# Patient Record
Sex: Female | Born: 1935 | Race: White | Hispanic: No | Marital: Single | Smoking: Never smoker
Health system: Southern US, Community
[De-identification: ages and names within clinical notes are randomized; demographics above are authoritative.]

## PROBLEM LIST (undated history)

## (undated) DIAGNOSIS — I4891 Unspecified atrial fibrillation: Secondary | ICD-10-CM

## (undated) DIAGNOSIS — I10 Essential (primary) hypertension: Secondary | ICD-10-CM

## (undated) DIAGNOSIS — F0280 Dementia in other diseases classified elsewhere without behavioral disturbance: Secondary | ICD-10-CM

---

## 1998-03-03 ENCOUNTER — Other Ambulatory Visit: Admission: RE | Admit: 1998-03-03 | Discharge: 1998-03-03 | Payer: Self-pay | Admitting: Obstetrics and Gynecology

## 1998-08-21 ENCOUNTER — Other Ambulatory Visit: Admission: RE | Admit: 1998-08-21 | Discharge: 1998-08-21 | Payer: Self-pay | Admitting: Obstetrics and Gynecology

## 1999-03-01 ENCOUNTER — Other Ambulatory Visit: Admission: RE | Admit: 1999-03-01 | Discharge: 1999-03-01 | Payer: Self-pay | Admitting: Obstetrics and Gynecology

## 1999-08-02 ENCOUNTER — Other Ambulatory Visit: Admission: RE | Admit: 1999-08-02 | Discharge: 1999-08-02 | Payer: Self-pay | Admitting: Obstetrics and Gynecology

## 2000-02-24 ENCOUNTER — Other Ambulatory Visit: Admission: RE | Admit: 2000-02-24 | Discharge: 2000-02-24 | Payer: Self-pay | Admitting: Obstetrics and Gynecology

## 2000-05-09 ENCOUNTER — Encounter: Payer: Self-pay | Admitting: Internal Medicine

## 2000-05-09 ENCOUNTER — Encounter: Admission: RE | Admit: 2000-05-09 | Discharge: 2000-05-09 | Payer: Self-pay | Admitting: Internal Medicine

## 2000-09-01 ENCOUNTER — Inpatient Hospital Stay (HOSPITAL_COMMUNITY): Admission: AD | Admit: 2000-09-01 | Discharge: 2000-09-12 | Payer: Self-pay | Admitting: Psychiatry

## 2000-09-10 ENCOUNTER — Encounter: Payer: Self-pay | Admitting: *Deleted

## 2001-01-08 ENCOUNTER — Other Ambulatory Visit: Admission: RE | Admit: 2001-01-08 | Discharge: 2001-01-08 | Payer: Self-pay | Admitting: Obstetrics and Gynecology

## 2001-08-17 ENCOUNTER — Other Ambulatory Visit: Admission: RE | Admit: 2001-08-17 | Discharge: 2001-08-17 | Payer: Self-pay | Admitting: Obstetrics and Gynecology

## 2002-08-19 ENCOUNTER — Encounter: Payer: Self-pay | Admitting: Emergency Medicine

## 2002-08-19 ENCOUNTER — Emergency Department (HOSPITAL_COMMUNITY): Admission: EM | Admit: 2002-08-19 | Discharge: 2002-08-19 | Payer: Self-pay | Admitting: Emergency Medicine

## 2002-11-06 ENCOUNTER — Other Ambulatory Visit: Admission: RE | Admit: 2002-11-06 | Discharge: 2002-11-06 | Payer: Self-pay | Admitting: Obstetrics and Gynecology

## 2003-10-15 ENCOUNTER — Encounter (INDEPENDENT_AMBULATORY_CARE_PROVIDER_SITE_OTHER): Payer: Self-pay | Admitting: Specialist

## 2003-10-15 ENCOUNTER — Ambulatory Visit (HOSPITAL_COMMUNITY): Admission: RE | Admit: 2003-10-15 | Discharge: 2003-10-15 | Payer: Self-pay | Admitting: *Deleted

## 2005-05-27 ENCOUNTER — Emergency Department (HOSPITAL_COMMUNITY): Admission: EM | Admit: 2005-05-27 | Discharge: 2005-05-28 | Payer: Self-pay | Admitting: Emergency Medicine

## 2005-09-01 ENCOUNTER — Other Ambulatory Visit: Admission: RE | Admit: 2005-09-01 | Discharge: 2005-09-01 | Payer: Self-pay | Admitting: Obstetrics and Gynecology

## 2007-03-05 ENCOUNTER — Ambulatory Visit (HOSPITAL_COMMUNITY): Admission: RE | Admit: 2007-03-05 | Discharge: 2007-03-05 | Payer: Self-pay | Admitting: *Deleted

## 2010-10-26 NOTE — Op Note (Signed)
NAMEJAREN, Jasmine Woodward               ACCOUNT NO.:  [id number]   MEDICAL RECORD NO.:  [id number]          PATIENT TYPE:  AMB   LOCATION:  ENDO                         FACILITY:  [facility]   PHYSICIAN:  Georgiana Spinner, M.D.    DATE OF BIRTH:  1928/01/15   DATE OF PROCEDURE:  03/05/2007  DATE OF DISCHARGE:                               OPERATIVE REPORT   PROCEDURE:  Colonoscopy.   INDICATIONS:  Colon polyps.   ANESTHESIA:  Fentanyl 25 mcg, Versed 3 mg   DESCRIPTION OF PROCEDURE:  With the patient mildly sedated in the left  lateral decubitus position, the Pentax videoscopic colonoscope was  inserted into the rectum, passed under direct vision to the cecum  identified by ileocecal valve and appendiceal orifice, both of which  were photographed.  From this point, the colonoscope was slowly  withdrawn taking circumferential views of colonic mucosa, stopping in  the rectum which appeared normal on direct and showed hemorrhoids on  retroflexed view.  The endoscope was straightened and withdrawn.  The  patient's vital signs and pulse oximeter remained stable.  The patient  tolerated the procedure well without apparent complications.   FINDINGS:  Internal hemorrhoids, otherwise an unremarkable colonoscopic  examination to the cecum.   PLAN:  Repeat examination in five years           ______________________________  Georgiana Spinner, M.D.     GMO/MEDQ  D:  03/05/2007  T:  03/05/2007  Job:  [id number]

## 2010-10-29 NOTE — H&P (Signed)
[facility]  Patient:    Jasmine Woodward, Jasmine Woodward                        MRN: [id number] Adm. Date:  [id number] Attending:  Jasmine Pang CC:         [facility].   Psychiatric Admission Assessment  DATE OF ADMISSION:  September 01, 2000.  IDENTIFICATION:  A 77 year old Caucasian female from [place], [place], admitted due to depression, delusional ideation and suicidal ideation.  Patient reports that she has become increasingly depressed over the past several weeks.  She has had multiple neurovegetative symptoms, including insomnia, with difficulty falling asleep, difficulty concentration, anhedonia, anergia, feelings of hopelessness, worthlessness, and suicidal ideation.  She became so hopeless prior to admission she attempted to smother herself twice this week with a pillow.  Her family reports that she has a delusional belief that her house is "sinking" and she is extremely anxious about this.  They report she is very anxious in general.  PAST PSYCHIATRIC HISTORY:  Patient has had 2 inpatient hospitalizations in the past, one at [facility] in February 1991 and one at [facility] sometime approximately 1960.  She is not currently receiving any psychiatric treatment or on any psychiatric medications.  PAST MEDICAL HISTORY:  Patient is treated by Dr. Jearl Klinefelter and Dr. Rosaura Carpenter in [place].  She suffers from hypertension, back pain.  She has also had a toenail fungus and a history of seizures many, many years ago, but this has not returned.  Medications include Premarin .3 mg q.d., Lozol 2.5 mg q.a.m. and a fungal infection cream for her toenails.  ALLERGIES:  No known drug allergies.  FAMILY PSYCHIATRIC HISTORY:  None reported.  SUBSTANCE ABUSE:  Patient and family deny.  SOCIAL HISTORY:  Patient [place] her [place] in [place] She has 2 children.  Her husband and son were with  her during the admission process.  They are supportive.  Her husband works second shift at [employer].  She does not work out of the home.  She has no legal problems.  MENTAL STATUS EXAMINATION:  Casually dressed, anxious appearing elderly female with intermittent eye contact.  There was significant psychomotor retardation. Speech was soft and slow.  Mood was depressed and anxious.  Affect consistent with mood.  There was positive suicidal ideation as per history of present illness.  She was able to contract not to harm herself while in the hospital. There was no homicidal ideation.  Psychosis:  There was positive delusions as per history of present illness.  There were no hallucinations.  Thought processes slowed and at times tangential.  Thought content:  Ruminating about whether she will be able to wash her clothes while in the hospital.  Cognitive exam:  Patient was oriented to person and place and reason for being here. She was unsure of the date.  Short term and long term memory were impaired. General fund of knowledge were deficient for her age and education level. Attention and concentration were poor.  Judgment poor, insight minimal.  ADMISSION DIAGNOSES: Axis I:    Major depression, recurrent, severe, with psychosis. Axis II:   Deferred. Axis III:  Healthy. Axis IV:   Severe. Axis V:    Global assessment of function current 10, highest past year 60.  ASSETS AND STRENGTHS:  Supportive family.  PROBLEMS:  Depressed mood with delusional ideation and suicidal ideation.  SHORT TERM TREATMENT GOAL:  Resolution of suicidal ideation, improvement in reality testing.  LONG TERM TREATMENT GOAL:  Resolution of mood instability and delusional ideation.  INITIAL PLAN OF CARE:  We will begin Risperdal 0.5 mg now, then b.i.d. and will begin Celexa 10 mg q.a.m. x 1 day, then 20 mg q.a.m.  Patient will also be involved in unit therapeutic groups and activities in order to help her with  activities of daily living and with reality orientation and decreased suicidal ideation.  Patient will have family therapy to determine disposition issues.  ESTIMATED LENGTH OF INPATIENT TREATMENT:  Five to seven days.  CONDITION NECESSARY FOR DISCHARGE:  Not suicidal, less delusional.  INITIAL DISCHARGE PLAN:  Patient will return home to live with her family in [place], [place].  Follow up therapy and medication management will be at the [facility]. DD:  09/02/00 TD:  09/03/00 Job: [id number]

## 2010-10-29 NOTE — Op Note (Signed)
NAME:  Jasmine Woodward, Jasmine Woodward                         ACCOUNT NO.:  [id number]   MEDICAL RECORD NO.:  [id number]                   PATIENT TYPE:  AMB   LOCATION:  ENDO                                 FACILITY:  [facility]   PHYSICIAN:  Georgiana Spinner, M.D.                 DATE OF BIRTH:  Mar 26, 1928   DATE OF PROCEDURE:  DATE OF DISCHARGE:                                 OPERATIVE REPORT   PROCEDURE:  Colonoscopy with polypectomy.   INDICATIONS:  Colon polyp.   ANESTHESIA:  Demerol 60 mg, Versed 6 mg.   DESCRIPTION OF PROCEDURE:  With the patient mildly sedated and in the left  lateral decubitus position, the Olympus videoscopic colonoscope was inserted  in the rectum and passed under direct vision to the cecum, identified by  ileocecal valve and appendiceal orifice, both of which were photographed.  From this point,  the colonoscope was slowly withdrawn, taking  circumferential views of the colonic mucosa, stopping at 60 cm from the anal  verge at which point a polyp was encountered.  It was photographed and was  removed using snare cautery technique, setting 20/20 blended current.  Tissue was suctioned into the colonoscope and retrieved.  The endoscope was  withdrawn all the way to the rectum which appeared normal on direct.  Showed  hemorrhoids on retroflexed view.  The endoscope was straightened and  withdrawn.  The patient's vital signs and pulse oximetry remained stable.  The patient tolerated the procedure well without apparent complications.   FINDINGS:  Internal hemorrhoids, polyps at 60 cm; otherwise unremarkable  exam.   PLAN:  Await biopsy report.  The patient will call me for results and follow  up with me as an outpatient.                                               Georgiana Spinner, M.D.    GMO/MEDQ  D:  10/15/2003  T:  10/15/2003  Job:  [id number]

## 2010-10-29 NOTE — Discharge Summary (Signed)
[facility]  Patient:    Jasmine Woodward, Jasmine Woodward                        MRN: [id number] Adm. Date:  [id number] Disc. Date: [id number] Attending:  Jasmine Pang CC:         [facility].                           Discharge Summary  REASON FOR ADMISSION:  The patient was a 77 year old Caucasian female from [place], [place], admitted due to depression, delusional ideation, and suicidal ideation.  She had become increasingly depressed over the past several weeks with multiple neurovegetative symptoms.  She had become hopeless due to a delusion that the foundation on her house was sinking.  She was anxious about this and wanted to remove her furniture from the home.  The patient has had two previous inpatient hospitalizations, one at [facility] and one at [facility].  She is currently on no psychiatric medications.  For further psychiatric admission information, see admission assessment.  PHYSICAL EXAMINATION:  This was done by [facility]  The patient was noted to have a fungal infection of her toes.  She was on medication for this (Sporanox).  She complained of some blurry vision.  She has hypertension that is treated.  Admission laboratories: A complete dementia workup was done due to the patients mental status which suggested a dementia.  Vitamin B12 was within normal limits.  Folate was within normal limits.  RPR: Nonreactive. CBC with differential: Grossly within normal limits.  Routine chemistry profile: Within normal limits except for a slightly elevated glucose of 137 (70 to 115).  TSH was within normal limits, free T4 within normal limits. Urine drug screen: Negative.  Urinalysis: Positive for many bacteria.  HOSPITAL COURSE:  Upon admission, the patient was continued on her home medications of Premarin 0.3 mg q.d., Lozol 2.5 mg p.o. q.d., Sporanox 100 mg p.o. two pills in the morning and at supper.  She was  also begun on trazodone 50 mg p.o. q.h.s.  On September 02, 2000, she was begun on Celexa 10 mg q.a.m. x 1 day then 20 mg q.a.m.  She was also begun on Risperdal 0.5 mg p.o. b.i.d. due to her delusional ideation.  She was also placed on Cogentin 1 mg p.o. q.4h. p.r.n. EPS.  MRI of the head was done; the results were overall unremarkable. and consistent with age.  An appointment will be made with [facility] on an outpatient basis to determine whether any further workup needs to be done for dementia.  A follow-up appointment will also be made with her primary care physician due to the slightly elevated glucose and evidence of urinary tract infection.  The patient had difficulty participating on the unit due to her decreased cognitive functioning.  There was an improvement in her delusional ideation as the hospitalization progressed.  She was able to get to the point that she could recognize that the houses foundation may not be moving though she still was very worried that it was.  She stated she wanted to go home, however, and felt comfortable living there and did not want to move her furniture out.  Her husband and son were very supportive.  They attended family sessions.  Her son arranged for numerous people from the community to be involved and help in the home setting  when Ms. Carbin returns home so she will have constant supervision.  They were not interested in placement at this point outside of the home and felt she would be happier there.  This was also Ms. Coxs wish.  DISCHARGE MENTAL STATUS EXAMINATION:  Improved.  Mood was less depressed and anxious.  Affect: Brighter, able to reveal wide range at times.  No suicidal or homicidal ideation, no self-injurious behavior or aggression.  Psychosis existed in that she still was somewhat delusional about the house foundation moving; however, she was able to admit she was not sure this was the case and knew that they may not  be shifting.  She had no hallucination.  Thought processes were fairly slowed and nonspontaneous.  Cognitive: Continued to reveal poor short-term and long-term memory and general fund of knowledge that was not consistent with her age and education level status.  Attention and concentration were poor.  Judgment: Poor.  Insight: Minimal.  DISCHARGE DIAGNOSES: Axis I:    1. Major depression, recurrent, severe with psychosis.            2. Dementia, not otherwise specified. Axis II:   None. Axis III:  1. Hypertension.            2. Toenail fungus.            3. Slightly elevated serum glucose.            4. Urinary tract infection. Axis IV:   Moderate. Axis V:    Global assessment of functioning at discharge was 45, highest past            year was 60, global assessment of functioning on admission was 10.  DISCHARGE MEDICATIONS: 1. Celexa 20 mg q.a.m. 2. Risperdal 0.5 mg p.o. b.i.d. 3. Cogentin 1 mg p.o. b.i.d. 4. Trazodone 50 mg p.o. q.h.s. 5. Septra DS one p.o. b.i.d. x 7 days to treat her UTI. 6. Sporanox 100 mg twice daily, Premarin 0.3 mg daily, and Lozol 2.5 mg daily     as prescribed by her primary care physician.  DIET:  No restrictions.  ACTIVITY LEVEL:  No restrictions.  FOLLOWUP: 1. [facility] for further workup of dementia. 2. Primary care physician for UTI and for increased serum glucose.  POST HOSPITAL CARE PLAN:  The patient will receive her psychiatric care at [facility].  She will have a entry appointment with Tresa Endo on September 20, 2000 at 1:45 p.m. DD:  09/12/00 TD:  09/12/00 Job: [id number]

## 2012-02-27 DIAGNOSIS — I10 Essential (primary) hypertension: Secondary | ICD-10-CM | POA: Diagnosis not present

## 2012-02-27 DIAGNOSIS — N390 Urinary tract infection, site not specified: Secondary | ICD-10-CM | POA: Diagnosis not present

## 2012-02-27 DIAGNOSIS — E782 Mixed hyperlipidemia: Secondary | ICD-10-CM | POA: Diagnosis not present

## 2012-02-27 DIAGNOSIS — H908 Mixed conductive and sensorineural hearing loss, unspecified: Secondary | ICD-10-CM | POA: Diagnosis not present

## 2012-02-27 DIAGNOSIS — M159 Polyosteoarthritis, unspecified: Secondary | ICD-10-CM | POA: Diagnosis not present

## 2012-03-05 DIAGNOSIS — H908 Mixed conductive and sensorineural hearing loss, unspecified: Secondary | ICD-10-CM | POA: Diagnosis not present

## 2012-03-05 DIAGNOSIS — M159 Polyosteoarthritis, unspecified: Secondary | ICD-10-CM | POA: Diagnosis not present

## 2012-03-05 DIAGNOSIS — Z780 Asymptomatic menopausal state: Secondary | ICD-10-CM | POA: Diagnosis not present

## 2012-03-05 DIAGNOSIS — Z23 Encounter for immunization: Secondary | ICD-10-CM | POA: Diagnosis not present

## 2012-03-05 DIAGNOSIS — E782 Mixed hyperlipidemia: Secondary | ICD-10-CM | POA: Diagnosis not present

## 2012-03-05 DIAGNOSIS — I10 Essential (primary) hypertension: Secondary | ICD-10-CM | POA: Diagnosis not present

## 2012-03-08 ENCOUNTER — Other Ambulatory Visit: Payer: Self-pay

## 2012-03-08 ENCOUNTER — Emergency Department (HOSPITAL_COMMUNITY)
Admission: EM | Admit: 2012-03-08 | Discharge: 2012-03-08 | Disposition: A | Discharge status: Home / Self Care | Payer: Medicare Other | Attending: Emergency Medicine | Admitting: Emergency Medicine

## 2012-03-08 ENCOUNTER — Encounter (HOSPITAL_COMMUNITY): Payer: Self-pay | Admitting: Emergency Medicine

## 2012-03-08 ENCOUNTER — Emergency Department (HOSPITAL_COMMUNITY): Payer: Medicare Other

## 2012-03-08 DIAGNOSIS — Z23 Encounter for immunization: Secondary | ICD-10-CM | POA: Insufficient documentation

## 2012-03-08 DIAGNOSIS — T63461A Toxic effect of venom of wasps, accidental (unintentional), initial encounter: Secondary | ICD-10-CM | POA: Insufficient documentation

## 2012-03-08 DIAGNOSIS — T6391XA Toxic effect of contact with unspecified venomous animal, accidental (unintentional), initial encounter: Secondary | ICD-10-CM | POA: Diagnosis not present

## 2012-03-08 DIAGNOSIS — R55 Syncope and collapse: Secondary | ICD-10-CM | POA: Diagnosis not present

## 2012-03-08 DIAGNOSIS — S0100XA Unspecified open wound of scalp, initial encounter: Secondary | ICD-10-CM | POA: Insufficient documentation

## 2012-03-08 DIAGNOSIS — I10 Essential (primary) hypertension: Secondary | ICD-10-CM | POA: Diagnosis not present

## 2012-03-08 DIAGNOSIS — T63441A Toxic effect of venom of bees, accidental (unintentional), initial encounter: Secondary | ICD-10-CM

## 2012-03-08 DIAGNOSIS — S0990XA Unspecified injury of head, initial encounter: Secondary | ICD-10-CM | POA: Diagnosis not present

## 2012-03-08 DIAGNOSIS — S0101XA Laceration without foreign body of scalp, initial encounter: Secondary | ICD-10-CM

## 2012-03-08 HISTORY — DX: Essential (primary) hypertension: I10

## 2012-03-08 LAB — COMPREHENSIVE METABOLIC PANEL
ALT: 18 U/L (ref 0–35)
AST: 27 U/L (ref 0–37)
Albumin: 4.3 g/dL (ref 3.5–5.2)
Alkaline Phosphatase: 94 U/L (ref 39–117)
BUN: 20 mg/dL (ref 6–23)
CO2: 28 mEq/L (ref 19–32)
Calcium: 10.3 mg/dL (ref 8.4–10.5)
Chloride: 100 mEq/L (ref 96–112)
Creatinine, Ser: 0.76 mg/dL (ref 0.50–1.10)
GFR calc Af Amer: 89 mL/min — ABNORMAL LOW (ref >90)
GFR calc non Af Amer: 76 mL/min — ABNORMAL LOW (ref >90)
Glucose, Bld: 129 mg/dL — ABNORMAL HIGH (ref 70–99)
Potassium: 3.9 mEq/L (ref 3.5–5.1)
Sodium: 139 mEq/L (ref 135–145)
Total Bilirubin: 0.5 mg/dL (ref 0.3–1.2)
Total Protein: 7.3 g/dL (ref 6.0–8.3)

## 2012-03-08 LAB — CBC WITH DIFFERENTIAL/PLATELET
Basophils Absolute: 0 K/uL (ref 0.0–0.1)
Basophils Relative: 0 % (ref 0–1)
Eosinophils Absolute: 0 K/uL (ref 0.0–0.7)
Eosinophils Relative: 0 % (ref 0–5)
HCT: 39.3 % (ref 36.0–46.0)
Hemoglobin: 13.5 g/dL (ref 12.0–15.0)
Lymphocytes Relative: 11 % — ABNORMAL LOW (ref 12–46)
Lymphs Abs: 1.3 K/uL (ref 0.7–4.0)
MCH: 30.4 pg (ref 26.0–34.0)
MCHC: 34.4 g/dL (ref 30.0–36.0)
MCV: 88.5 fL (ref 78.0–100.0)
Monocytes Absolute: 0.5 K/uL (ref 0.1–1.0)
Monocytes Relative: 4 % (ref 3–12)
Neutro Abs: 9.5 K/uL — ABNORMAL HIGH (ref 1.7–7.7)
Neutrophils Relative %: 84 % — ABNORMAL HIGH (ref 43–77)
Platelets: 209 K/uL (ref 150–400)
RBC: 4.44 MIL/uL (ref 3.87–5.11)
RDW: 13.9 % (ref 11.5–15.5)
WBC: 11.2 K/uL — ABNORMAL HIGH (ref 4.0–10.5)

## 2012-03-08 MED ORDER — DIPHENHYDRAMINE HCL 25 MG PO CAPS
25.0000 mg | ORAL_CAPSULE | Freq: Once | ORAL | Status: AC
  Administered 2012-03-08: 25 mg via ORAL
  Filled 2012-03-08: qty 1

## 2012-03-08 MED ORDER — TETANUS-DIPHTH-ACELL PERTUSSIS 5-2.5-18.5 LF-MCG/0.5 IM SUSP
0.5000 mL | Freq: Once | INTRAMUSCULAR | Status: AC
  Administered 2012-03-08: 0.5 mL via INTRAMUSCULAR
  Filled 2012-03-08: qty 0.5

## 2012-03-08 MED ORDER — DIPHENHYDRAMINE HCL 25 MG PO TABS: 12.5000 mg | ORAL_TABLET | Freq: Three times a day (TID) | ORAL | Status: DC | PRN

## 2012-03-08 MED ORDER — LIDOCAINE-EPINEPHRINE 2 %-1:100000 IJ SOLN
20.0000 mL | Freq: Once | INTRAMUSCULAR | Status: DC
Start: 2012-03-08 — End: 2012-03-08
  Filled 2012-03-08: qty 20

## 2012-03-08 NOTE — ED Notes (Addendum)
Pt reports she was outside trimming hedges when a bee stung her on her right hand.  She stated, "I passed out and rolled down a hill and landed on some asphalt.  Pt's son said she was unconscious for about 15 minutes.  Pt is alert and oriented and does not appear to be in any respiratory distress.

## 2012-03-08 NOTE — ED Notes (Signed)
EDP in to staple scalp lac

## 2012-03-08 NOTE — ED Notes (Signed)
Pt currently in xray dept. 

## 2012-03-08 NOTE — ED Notes (Signed)
Onset today bee sting right 5th finger continued to work and then syncope approx 15-30 minutes.  Patient has head laceration and left knee abrasions. Pain 2/10 soreness head and left knee. And itching right fifth digit where bee sting. Ax4. Answering and following commands appropriate.

## 2012-03-08 NOTE — ED Provider Notes (Addendum)
History     CSN: [id number]  Arrival date & time 03/08/12  1212   First MD Initiated Contact with Patient 03/08/12 1359      Chief Complaint  Patient presents with  . Loss of Consciousness  . Insect Bite    (Consider location/radiation/quality/duration/timing/severity/associated sxs/prior treatment) HPI Comments: Ms. Ammon presents from home with her son for evaluation.  She states she was trimming a bush earlier today when a bee stung her on the right hand.  She reports severe pain and then, I blacked out".  She awoke on the ground with a headache and scalp bleeding.  She was able to get back upright and she called her son on the telephone for a ride to the hospital.  She currently c/o redness, swelling, and burning of the 3rd-5th fingers on her right hand.  She also reports a bump on her head.  She denies any neck pain, visual changes, one-sided weakness, CP, palpitations, SOB, coughing, abd pain, hip pain, extremity pain.  She adds there is a small contusion on her left knee and a scrape on her left leg.  Patient is a 78 y.o. female presenting with syncope. The history is provided by the patient. No language interpreter was used.  Loss of Consciousness This is a new problem. The current episode started 3 to 5 hours ago. Episode frequency: 1 episode. The problem has been resolved. Pertinent negatives include no chest pain, no abdominal pain, no headaches and no shortness of breath. Nothing aggravates the symptoms.    Past Medical History  Diagnosis Date  . Hypertension     History reviewed. No pertinent past surgical history.  No family history on file.  History  Substance Use Topics  . Smoking status: Never Smoker   . Smokeless tobacco: Not on file  . Alcohol Use:     OB History    Grav Para Term Preterm Abortions TAB SAB Ect Mult Living                  Review of Systems  Constitutional: Negative for fever, chills, diaphoresis, activity change, appetite change and  fatigue.  HENT: Negative for ear pain, nosebleeds, congestion, sore throat, facial swelling, rhinorrhea, trouble swallowing, neck pain, neck stiffness, dental problem and voice change.   Eyes: Negative for pain and visual disturbance.  Respiratory: Negative for cough, choking, chest tightness, shortness of breath and wheezing.   Cardiovascular: Positive for syncope. Negative for chest pain, palpitations and leg swelling.  Gastrointestinal: Negative for nausea, vomiting, abdominal pain, diarrhea and constipation.  Genitourinary: Negative for dysuria, urgency, hematuria, decreased urine volume and difficulty urinating.  Musculoskeletal: Negative for myalgias, back pain, joint swelling, arthralgias and gait problem.  Skin: Positive for color change and wound. Negative for pallor and rash.  Neurological: Positive for syncope. Negative for dizziness, tremors, weakness, light-headedness, numbness and headaches.  Hematological: Negative for adenopathy. Does not bruise/bleed easily.  Psychiatric/Behavioral: Negative for confusion.    Allergies  Review of patient's allergies indicates no known allergies.  Home Medications   Current Outpatient Rx  Name Route Sig Dispense Refill  . INDAPAMIDE 2.5 MG PO TABS Oral Take 2.5 mg by mouth every morning.    . ADULT MULTIVITAMIN W/MINERALS CH Oral Take 1 tablet by mouth daily.      BP 157/64  Pulse 68  Temp 98 F (36.7 C) (Oral)  Resp 18  SpO2 100%  Physical Exam  Vitals reviewed. Constitutional: She is oriented to person, place, and time. She  appears well-developed and well-nourished. She is active.  Non-toxic appearance. She does not have a sickly appearance. She does not appear ill. No distress. She is not intubated.  HENT:  Head: Normocephalic. Not macrocephalic and not microcephalic. Head is with contusion and with laceration. Head is without raccoon's eyes, without Battle's sign, without right periorbital erythema and without left periorbital  erythema. No trismus in the jaw.    Right Ear: Tympanic membrane, external ear and ear canal normal. No mastoid tenderness. No hemotympanum.  Left Ear: Tympanic membrane, external ear and ear canal normal. No mastoid tenderness. No hemotympanum.  Nose: Right sinus exhibits no maxillary sinus tenderness and no frontal sinus tenderness. Left sinus exhibits no maxillary sinus tenderness and no frontal sinus tenderness.  Mouth/Throat: Uvula is midline and oropharynx is clear and moist. Mucous membranes are not pale, not dry and not cyanotic. No uvula swelling.  Eyes: Conjunctivae normal, EOM and lids are normal. Pupils are equal, round, and reactive to light. Right eye exhibits no discharge and no exudate. Left eye exhibits no discharge and no exudate. Right conjunctiva is not injected. Right conjunctiva has no hemorrhage. Left conjunctiva is not injected. Left conjunctiva has no hemorrhage. No scleral icterus. Right eye exhibits normal extraocular motion and no nystagmus. Left eye exhibits normal extraocular motion and no nystagmus. Right pupil is round and reactive. Left pupil is round and reactive. Pupils are equal.  Neck: Trachea normal, normal range of motion, full passive range of motion without pain and phonation normal. Neck supple. Normal carotid pulses and no JVD present. No tracheal tenderness, no spinous process tenderness and no muscular tenderness present. Carotid bruit is not present. No rigidity. No tracheal deviation, no edema, no erythema and normal range of motion present. No mass and no thyromegaly present.  Cardiovascular: Regular rhythm, normal heart sounds, intact distal pulses and normal pulses.   No extrasystoles are present. PMI is not displaced.  Exam reveals no gallop and no decreased pulses.   No murmur heard. Pulmonary/Chest: Effort normal. No accessory muscle usage or stridor. No apnea, not tachypneic and not bradypneic. She is not intubated. No respiratory distress. She has no  decreased breath sounds. She has no wheezes. She has no rhonchi. She has no rales.  Abdominal: Normal appearance and bowel sounds are normal. She exhibits no shifting dullness, no pulsatile midline mass and no mass. There is no hepatosplenomegaly. There is no tenderness. There is no rigidity, no rebound, no guarding, no CVA tenderness, no tenderness at McBurney's point and negative Murphy's sign. No hernia. Hernia confirmed negative in the ventral area.  Musculoskeletal: Normal range of motion. She exhibits no edema and no tenderness.       Left knee: She exhibits no swelling, no effusion, no ecchymosis, no deformity, no laceration, no erythema and no bony tenderness. no tenderness found.       Legs: Neurological: She is alert and oriented to person, place, and time. She displays no atrophy and no tremor. No cranial nerve deficit. She exhibits normal muscle tone. She displays no seizure activity. Coordination normal. She displays no Babinski's sign on the right side. She displays no Babinski's sign on the left side.  Skin: Skin is warm and dry. Laceration noted. No rash noted. She is not diaphoretic.     Psychiatric: She has a normal mood and affect. Her behavior is normal. Her mood appears not anxious. Her affect is not angry, not blunt, not labile and not inappropriate. Her speech is not rapid and/or pressured,  not delayed, not tangential and not slurred. She is not agitated, not aggressive, is not hyperactive, not slowed, not withdrawn, not actively hallucinating and not combative. Cognition and memory are normal. Cognition and memory are not impaired. She does not exhibit a depressed mood. She is communicative. She exhibits normal recent memory and normal remote memory. She is attentive.    ED Course  LACERATION REPAIR Performed by: Dana Allan T Authorized by: Dana Allan T Consent: Verbal consent obtained. Written consent not obtained. Risks and benefits: risks, benefits and alternatives  were discussed Consent given by: patient Patient understanding: patient states understanding of the procedure being performed Patient identity confirmed: verbally with patient Body area: head/neck Location details: scalp Laceration length: 2 cm Foreign bodies: no foreign bodies Tendon involvement: none Nerve involvement: none Vascular damage: no Anesthesia: local infiltration Local anesthetic: lidocaine 2% with epinephrine Anesthetic total: 2 ml Patient sedated: no Preparation: Patient was prepped and draped in the usual sterile fashion. Irrigation solution: saline Irrigation method: syringe Amount of cleaning: standard Debridement: none Skin closure: staples Number of sutures: 3 Technique: simple Approximation: close Approximation difficulty: simple Dressing: antibiotic ointment Patient tolerance: Patient tolerated the procedure well with no immediate complications.   (including critical care time)  Labs Reviewed  CBC WITH DIFFERENTIAL - Abnormal; Notable for the following:    WBC 11.2 (*)     Neutrophils Relative 84 (*)     Neutro Abs 9.5 (*)     Lymphocytes Relative 11 (*)     All other components within normal limits  COMPREHENSIVE METABOLIC PANEL - Abnormal; Notable for the following:    Glucose, Bld 129 (*)     GFR calc non Af Amer 76 (*)     GFR calc Af Amer 89 (*)     All other components within normal limits   No results found.   No diagnosis found.   Date: 03/08/2012  Rate: 62 bpm  Rhythm: sinus  QRS Axis: normal  Intervals: normal  ST/T Wave abnormalities: nonspecific ST changes  Conduction Disutrbances:none  Narrative Interpretation: no STEMI  Old EKG Reviewed: none available      MDM  Pt presents for evaluation after a syncopal event.  She reports being stung by a bee (or some other flying insect) just before collapsing at around 1015 this morning.  She denies any respiratory complaints, abd or pelvic pain, extremity pain, or neck pain.  She  has a scalp laceration but no step-offs, depressions, or midline tenderness.  Plan basic labs, ECG, orthostatic vital signs,    1615.  Swelling and redness of right hand has improved.  Pt tolerated laceration repair, NAD.  Note no significant abnormalities on review of CBC, BMP, CXR, and CT of head.  Pt is A&o without focal neurologic deficit and she denies any other complaints.  Today's event appears to be a vasovagal episode secondary to pain after the bee sting.  Plan discharge home.  Discussed indications for return to the ER.  Pt should follow-up in 5-7 days for suture removal.    Tobin Chad, MD 03/08/12 1622  Tobin Chad, MD 03/31/12 [id number]

## 2012-03-08 NOTE — ED Notes (Signed)
Patient transported from X-ray 

## 2012-03-14 DIAGNOSIS — R55 Syncope and collapse: Secondary | ICD-10-CM | POA: Diagnosis not present

## 2012-03-14 DIAGNOSIS — S0100XA Unspecified open wound of scalp, initial encounter: Secondary | ICD-10-CM | POA: Diagnosis not present

## 2012-03-14 DIAGNOSIS — I10 Essential (primary) hypertension: Secondary | ICD-10-CM | POA: Diagnosis not present

## 2012-03-21 DIAGNOSIS — R55 Syncope and collapse: Secondary | ICD-10-CM | POA: Diagnosis not present

## 2012-03-21 DIAGNOSIS — Z91038 Other insect allergy status: Secondary | ICD-10-CM | POA: Diagnosis not present

## 2012-06-01 DIAGNOSIS — Z1231 Encounter for screening mammogram for malignant neoplasm of breast: Secondary | ICD-10-CM | POA: Diagnosis not present

## 2012-06-11 DIAGNOSIS — R928 Other abnormal and inconclusive findings on diagnostic imaging of breast: Secondary | ICD-10-CM | POA: Diagnosis not present

## 2012-06-15 DIAGNOSIS — H40019 Open angle with borderline findings, low risk, unspecified eye: Secondary | ICD-10-CM | POA: Diagnosis not present

## 2012-07-13 DIAGNOSIS — H26499 Other secondary cataract, unspecified eye: Secondary | ICD-10-CM | POA: Diagnosis not present

## 2012-07-13 DIAGNOSIS — H40019 Open angle with borderline findings, low risk, unspecified eye: Secondary | ICD-10-CM | POA: Diagnosis not present

## 2012-10-08 DIAGNOSIS — I10 Essential (primary) hypertension: Secondary | ICD-10-CM | POA: Diagnosis not present

## 2012-10-08 DIAGNOSIS — H8110 Benign paroxysmal vertigo, unspecified ear: Secondary | ICD-10-CM | POA: Diagnosis not present

## 2012-10-08 DIAGNOSIS — E782 Mixed hyperlipidemia: Secondary | ICD-10-CM | POA: Diagnosis not present

## 2012-10-08 DIAGNOSIS — G4700 Insomnia, unspecified: Secondary | ICD-10-CM | POA: Diagnosis not present

## 2013-01-15 DIAGNOSIS — H40019 Open angle with borderline findings, low risk, unspecified eye: Secondary | ICD-10-CM | POA: Diagnosis not present

## 2013-01-15 DIAGNOSIS — H26499 Other secondary cataract, unspecified eye: Secondary | ICD-10-CM | POA: Diagnosis not present

## 2013-03-04 DIAGNOSIS — E782 Mixed hyperlipidemia: Secondary | ICD-10-CM | POA: Diagnosis not present

## 2013-03-04 DIAGNOSIS — I10 Essential (primary) hypertension: Secondary | ICD-10-CM | POA: Diagnosis not present

## 2013-03-04 DIAGNOSIS — Z0000 Encounter for general adult medical examination without abnormal findings: Secondary | ICD-10-CM | POA: Diagnosis not present

## 2013-03-04 DIAGNOSIS — Z1331 Encounter for screening for depression: Secondary | ICD-10-CM | POA: Diagnosis not present

## 2013-03-11 DIAGNOSIS — I10 Essential (primary) hypertension: Secondary | ICD-10-CM | POA: Diagnosis not present

## 2013-03-11 DIAGNOSIS — I4891 Unspecified atrial fibrillation: Secondary | ICD-10-CM | POA: Diagnosis not present

## 2013-03-11 DIAGNOSIS — E782 Mixed hyperlipidemia: Secondary | ICD-10-CM | POA: Diagnosis not present

## 2013-03-11 DIAGNOSIS — M159 Polyosteoarthritis, unspecified: Secondary | ICD-10-CM | POA: Diagnosis not present

## 2013-03-15 DIAGNOSIS — I4891 Unspecified atrial fibrillation: Secondary | ICD-10-CM | POA: Diagnosis not present

## 2013-03-15 DIAGNOSIS — I10 Essential (primary) hypertension: Secondary | ICD-10-CM | POA: Diagnosis not present

## 2013-03-15 DIAGNOSIS — M159 Polyosteoarthritis, unspecified: Secondary | ICD-10-CM | POA: Diagnosis not present

## 2013-03-15 DIAGNOSIS — E782 Mixed hyperlipidemia: Secondary | ICD-10-CM | POA: Diagnosis not present

## 2013-03-15 DIAGNOSIS — Z23 Encounter for immunization: Secondary | ICD-10-CM | POA: Diagnosis not present

## 2013-03-27 DIAGNOSIS — I4891 Unspecified atrial fibrillation: Secondary | ICD-10-CM | POA: Diagnosis not present

## 2013-04-26 DIAGNOSIS — I4891 Unspecified atrial fibrillation: Secondary | ICD-10-CM | POA: Diagnosis not present

## 2013-05-03 DIAGNOSIS — Z79899 Other long term (current) drug therapy: Secondary | ICD-10-CM | POA: Diagnosis not present

## 2013-05-03 DIAGNOSIS — I4891 Unspecified atrial fibrillation: Secondary | ICD-10-CM | POA: Diagnosis not present

## 2013-05-03 DIAGNOSIS — I10 Essential (primary) hypertension: Secondary | ICD-10-CM | POA: Diagnosis not present

## 2013-05-03 DIAGNOSIS — E782 Mixed hyperlipidemia: Secondary | ICD-10-CM | POA: Diagnosis not present

## 2013-05-03 DIAGNOSIS — M159 Polyosteoarthritis, unspecified: Secondary | ICD-10-CM | POA: Diagnosis not present

## 2013-07-22 DIAGNOSIS — I4891 Unspecified atrial fibrillation: Secondary | ICD-10-CM | POA: Diagnosis not present

## 2013-07-22 DIAGNOSIS — E782 Mixed hyperlipidemia: Secondary | ICD-10-CM | POA: Diagnosis not present

## 2013-07-26 DIAGNOSIS — I4891 Unspecified atrial fibrillation: Secondary | ICD-10-CM | POA: Diagnosis not present

## 2013-07-26 DIAGNOSIS — R5381 Other malaise: Secondary | ICD-10-CM | POA: Diagnosis not present

## 2013-07-26 DIAGNOSIS — I10 Essential (primary) hypertension: Secondary | ICD-10-CM | POA: Diagnosis not present

## 2013-07-26 DIAGNOSIS — E782 Mixed hyperlipidemia: Secondary | ICD-10-CM | POA: Diagnosis not present

## 2013-07-29 DIAGNOSIS — R5383 Other fatigue: Secondary | ICD-10-CM | POA: Diagnosis not present

## 2013-07-29 DIAGNOSIS — E782 Mixed hyperlipidemia: Secondary | ICD-10-CM | POA: Diagnosis not present

## 2013-07-29 DIAGNOSIS — R413 Other amnesia: Secondary | ICD-10-CM | POA: Diagnosis not present

## 2013-07-29 DIAGNOSIS — M25519 Pain in unspecified shoulder: Secondary | ICD-10-CM | POA: Diagnosis not present

## 2013-07-29 DIAGNOSIS — I10 Essential (primary) hypertension: Secondary | ICD-10-CM | POA: Diagnosis not present

## 2013-07-29 DIAGNOSIS — R5381 Other malaise: Secondary | ICD-10-CM | POA: Diagnosis not present

## 2013-09-04 DIAGNOSIS — H4011X0 Primary open-angle glaucoma, stage unspecified: Secondary | ICD-10-CM | POA: Diagnosis not present

## 2013-09-04 DIAGNOSIS — H40019 Open angle with borderline findings, low risk, unspecified eye: Secondary | ICD-10-CM | POA: Diagnosis not present

## 2013-09-04 DIAGNOSIS — H43399 Other vitreous opacities, unspecified eye: Secondary | ICD-10-CM | POA: Diagnosis not present

## 2013-09-04 DIAGNOSIS — H26499 Other secondary cataract, unspecified eye: Secondary | ICD-10-CM | POA: Diagnosis not present

## 2013-10-01 DIAGNOSIS — Z1231 Encounter for screening mammogram for malignant neoplasm of breast: Secondary | ICD-10-CM | POA: Diagnosis not present

## 2013-11-11 DIAGNOSIS — R82998 Other abnormal findings in urine: Secondary | ICD-10-CM | POA: Diagnosis not present

## 2013-11-11 DIAGNOSIS — Z01419 Encounter for gynecological examination (general) (routine) without abnormal findings: Secondary | ICD-10-CM | POA: Diagnosis not present

## 2013-11-11 DIAGNOSIS — Z0000 Encounter for general adult medical examination without abnormal findings: Secondary | ICD-10-CM | POA: Diagnosis not present

## 2013-11-11 DIAGNOSIS — Z8542 Personal history of malignant neoplasm of other parts of uterus: Secondary | ICD-10-CM | POA: Diagnosis not present

## 2013-11-12 DIAGNOSIS — R82998 Other abnormal findings in urine: Secondary | ICD-10-CM | POA: Diagnosis not present

## 2014-01-30 DIAGNOSIS — M25549 Pain in joints of unspecified hand: Secondary | ICD-10-CM | POA: Diagnosis not present

## 2014-01-30 DIAGNOSIS — M19049 Primary osteoarthritis, unspecified hand: Secondary | ICD-10-CM | POA: Diagnosis not present

## 2014-02-25 DIAGNOSIS — H26499 Other secondary cataract, unspecified eye: Secondary | ICD-10-CM | POA: Diagnosis not present

## 2014-02-25 DIAGNOSIS — H4011X0 Primary open-angle glaucoma, stage unspecified: Secondary | ICD-10-CM | POA: Diagnosis not present

## 2014-02-25 DIAGNOSIS — H43399 Other vitreous opacities, unspecified eye: Secondary | ICD-10-CM | POA: Diagnosis not present

## 2014-03-17 DIAGNOSIS — Z0000 Encounter for general adult medical examination without abnormal findings: Secondary | ICD-10-CM | POA: Diagnosis not present

## 2014-03-17 DIAGNOSIS — E782 Mixed hyperlipidemia: Secondary | ICD-10-CM | POA: Diagnosis not present

## 2014-03-17 DIAGNOSIS — Z1389 Encounter for screening for other disorder: Secondary | ICD-10-CM | POA: Diagnosis not present

## 2014-03-17 DIAGNOSIS — I10 Essential (primary) hypertension: Secondary | ICD-10-CM | POA: Diagnosis not present

## 2014-03-24 DIAGNOSIS — I482 Chronic atrial fibrillation: Secondary | ICD-10-CM | POA: Diagnosis not present

## 2014-03-24 DIAGNOSIS — I10 Essential (primary) hypertension: Secondary | ICD-10-CM | POA: Diagnosis not present

## 2014-03-24 DIAGNOSIS — Z780 Asymptomatic menopausal state: Secondary | ICD-10-CM | POA: Diagnosis not present

## 2014-03-24 DIAGNOSIS — Z23 Encounter for immunization: Secondary | ICD-10-CM | POA: Diagnosis not present

## 2014-03-24 DIAGNOSIS — E782 Mixed hyperlipidemia: Secondary | ICD-10-CM | POA: Diagnosis not present

## 2014-03-24 DIAGNOSIS — M159 Polyosteoarthritis, unspecified: Secondary | ICD-10-CM | POA: Diagnosis not present

## 2014-04-02 DIAGNOSIS — Z23 Encounter for immunization: Secondary | ICD-10-CM | POA: Diagnosis not present

## 2014-04-29 DIAGNOSIS — R41841 Cognitive communication deficit: Secondary | ICD-10-CM | POA: Diagnosis not present

## 2014-04-30 ENCOUNTER — Encounter (HOSPITAL_BASED_OUTPATIENT_CLINIC_OR_DEPARTMENT_OTHER): Payer: Self-pay | Admitting: Emergency Medicine

## 2014-04-30 ENCOUNTER — Emergency Department (HOSPITAL_BASED_OUTPATIENT_CLINIC_OR_DEPARTMENT_OTHER)
Admission: EM | Admit: 2014-04-30 | Discharge: 2014-04-30 | Disposition: A | Discharge status: Home / Self Care | Payer: Medicare Other | Attending: Emergency Medicine | Admitting: Emergency Medicine

## 2014-04-30 ENCOUNTER — Emergency Department (HOSPITAL_BASED_OUTPATIENT_CLINIC_OR_DEPARTMENT_OTHER): Payer: Medicare Other

## 2014-04-30 DIAGNOSIS — S0990XA Unspecified injury of head, initial encounter: Secondary | ICD-10-CM | POA: Insufficient documentation

## 2014-04-30 DIAGNOSIS — S098XXA Other specified injuries of head, initial encounter: Secondary | ICD-10-CM | POA: Diagnosis not present

## 2014-04-30 DIAGNOSIS — W19XXXA Unspecified fall, initial encounter: Secondary | ICD-10-CM

## 2014-04-30 DIAGNOSIS — T148XXA Other injury of unspecified body region, initial encounter: Secondary | ICD-10-CM

## 2014-04-30 DIAGNOSIS — S0083XA Contusion of other part of head, initial encounter: Secondary | ICD-10-CM | POA: Diagnosis not present

## 2014-04-30 DIAGNOSIS — S60011A Contusion of right thumb without damage to nail, initial encounter: Secondary | ICD-10-CM | POA: Insufficient documentation

## 2014-04-30 DIAGNOSIS — Y9389 Activity, other specified: Secondary | ICD-10-CM | POA: Insufficient documentation

## 2014-04-30 DIAGNOSIS — Y998 Other external cause status: Secondary | ICD-10-CM | POA: Diagnosis not present

## 2014-04-30 DIAGNOSIS — S65491A Other specified injury of blood vessel of right thumb, initial encounter: Secondary | ICD-10-CM | POA: Diagnosis present

## 2014-04-30 DIAGNOSIS — Y9289 Other specified places as the place of occurrence of the external cause: Secondary | ICD-10-CM | POA: Diagnosis not present

## 2014-04-30 DIAGNOSIS — W010XXA Fall on same level from slipping, tripping and stumbling without subsequent striking against object, initial encounter: Secondary | ICD-10-CM | POA: Diagnosis not present

## 2014-04-30 DIAGNOSIS — S0180XA Unspecified open wound of other part of head, initial encounter: Secondary | ICD-10-CM | POA: Diagnosis not present

## 2014-04-30 DIAGNOSIS — I10 Essential (primary) hypertension: Secondary | ICD-10-CM | POA: Diagnosis not present

## 2014-04-30 LAB — URINALYSIS, ROUTINE W REFLEX MICROSCOPIC
Bilirubin Urine: NEGATIVE
Glucose, UA: NEGATIVE mg/dL
Hgb urine dipstick: NEGATIVE
Ketones, ur: NEGATIVE mg/dL
Nitrite: NEGATIVE
Protein, ur: NEGATIVE mg/dL
Specific Gravity, Urine: 1.011 (ref 1.005–1.030)
Urobilinogen, UA: 0.2 mg/dL (ref 0.0–1.0)
pH: 7 (ref 5.0–8.0)

## 2014-04-30 LAB — URINE MICROSCOPIC-ADD ON

## 2014-04-30 NOTE — ED Notes (Signed)
Back from CT

## 2014-04-30 NOTE — Discharge Instructions (Signed)
We saw you in the ER after you had a fall. All the imaging results are normal, no fractures seen. No evidence of brain bleed. Please be very careful with walking, and do everything possible to prevent falls.   Contusion A contusion is a deep bruise. Contusions are the result of an injury that caused bleeding under the skin. The contusion may turn blue, purple, or yellow. Minor injuries will give you a painless contusion, but more severe contusions may stay painful and swollen for a few weeks.  CAUSES  A contusion is usually caused by a blow, trauma, or direct force to an area of the body. SYMPTOMS   Swelling and redness of the injured area.  Bruising of the injured area.  Tenderness and soreness of the injured area.  Pain. DIAGNOSIS  The diagnosis can be made by taking a history and physical exam. An X-ray, CT scan, or MRI may be needed to determine if there were any associated injuries, such as fractures. TREATMENT  Specific treatment will depend on what area of the body was injured. In general, the best treatment for a contusion is resting, icing, elevating, and applying cold compresses to the injured area. Over-the-counter medicines may also be recommended for pain control. Ask your caregiver what the best treatment is for your contusion. HOME CARE INSTRUCTIONS   Put ice on the injured area.  Put ice in a plastic bag.  Place a towel between your skin and the bag.  Leave the ice on for 15-20 minutes, 3-4 times a day, or as directed by your health care provider.  Only take over-the-counter or prescription medicines for pain, discomfort, or fever as directed by your caregiver. Your caregiver may recommend avoiding anti-inflammatory medicines (aspirin, ibuprofen, and naproxen) for 48 hours because these medicines may increase bruising.  Rest the injured area.  If possible, elevate the injured area to reduce swelling. SEEK IMMEDIATE MEDICAL CARE IF:   You have increased bruising  or swelling.  You have pain that is getting worse.  Your swelling or pain is not relieved with medicines. MAKE SURE YOU:   Understand these instructions.  Will watch your condition.  Will get help right away if you are not doing well or get worse. Document Released: 03/09/2005 Document Revised: 06/04/2013 Document Reviewed: 04/04/2011 [facility] Patient Information 2015 [employer], [place]. This information is not intended to replace advice given to you by your health care provider. Make sure you discuss any questions you have with your health care provider. RICE: Routine Care for Injuries The routine care of many injuries includes Rest, Ice, Compression, and Elevation (RICE). HOME CARE INSTRUCTIONS  Rest is needed to allow your body to heal. Routine activities can usually be resumed when comfortable. Injured tendons and bones can take up to 6 weeks to heal. Tendons are the cord-like structures that attach muscle to bone.  Ice following an injury helps keep the swelling down and reduces pain.  Put ice in a plastic bag.  Place a towel between your skin and the bag.  Leave the ice on for 15-20 minutes, 3-4 times a day, or as directed by your health care provider. Do this while awake, for the first 24 to 48 hours. After that, continue as directed by your caregiver.  Compression helps keep swelling down. It also gives support and helps with discomfort. If an elastic bandage has been applied, it should be removed and reapplied every 3 to 4 hours. It should not be applied tightly, but firmly enough to  keep swelling down. Watch fingers or toes for swelling, bluish discoloration, coldness, numbness, or excessive pain. If any of these problems occur, remove the bandage and reapply loosely. Contact your caregiver if these problems continue.  Elevation helps reduce swelling and decreases pain. With extremities, such as the arms, hands, legs, and feet, the injured area should be placed near or above the  level of the heart, if possible. SEEK IMMEDIATE MEDICAL CARE IF:  You have persistent pain and swelling.  You develop redness, numbness, or unexpected weakness.  Your symptoms are getting worse rather than improving after several days. These symptoms may indicate that further evaluation or further X-rays are needed. Sometimes, X-rays may not show a small broken bone (fracture) until 1 week or 10 days later. Make a follow-up appointment with your caregiver. Ask when your X-ray results will be ready. Make sure you get your X-ray results. Document Released: 09/11/2000 Document Revised: 06/04/2013 Document Reviewed: 10/29/2010 [facility] Patient Information 2015 [place], [place]. This information is not intended to replace advice given to you by your health care provider. Make sure you discuss any questions you have with your health care provider.

## 2014-04-30 NOTE — ED Notes (Signed)
[facility] mechanical fall with no LOC, no neck or back pain, small abrasions to nose and over right eye, no bleeding, no blood thinners, A/O X4, ambulatory and in NAD

## 2014-04-30 NOTE — ED Notes (Signed)
Patient transported to CT 

## 2014-05-01 NOTE — ED Provider Notes (Signed)
CSN: [id number]     Arrival date & time 04/30/14  1208 History   First MD Initiated Contact with Patient 04/30/14 1352     Chief Complaint  Patient presents with  . Fall     (Consider location/radiation/quality/duration/timing/severity/associated sxs/prior Treatment) HPI Comments: Pt on pradaxa comes in after having a mechanical fall. Pt was turning in an elevator, tripped and fell. No LOC. Pt has since ambulated. She has mild pain to the right thumb. No headaches, visual complains. No hx of recurrent falls.  Patient is a 80 y.o. female presenting with fall. The history is provided by the patient.  Fall Pertinent negatives include no chest pain, no abdominal pain and no shortness of breath.    Past Medical History  Diagnosis Date  . Hypertension    History reviewed. No pertinent past surgical history. No family history on file. History  Substance Use Topics  . Smoking status: Never Smoker   . Smokeless tobacco: Not on file  . Alcohol Use: Not on file   OB History    No data available     Review of Systems  Constitutional: Negative for activity change.  HENT: Positive for facial swelling.   Respiratory: Negative for shortness of breath.   Cardiovascular: Negative for chest pain.  Gastrointestinal: Negative for abdominal pain and abdominal distention.  Genitourinary: Negative for hematuria and difficulty urinating.  Musculoskeletal: Negative for neck pain and neck stiffness.  Skin: Positive for wound. Negative for color change.  Hematological: Bruises/bleeds easily.  Psychiatric/Behavioral: Negative for confusion.      Allergies  Review of patient's allergies indicates no known allergies.  Home Medications   Prior to Admission medications   Medication Sig Start Date End Date Taking? Authorizing Provider  diphenhydrAMINE (BENADRYL) 25 MG tablet Take 0.5 tablets (12.5 mg total) by mouth every 8 (eight) hours as needed for itching (use as needed for itching and  swelling in right hand). 03/08/12   Tobin ChadMarwan T Powers, MD  indapamide (LOZOL) 2.5 MG tablet Take 2.5 mg by mouth every morning.    Historical Provider, MD  Multiple Vitamin (MULTIVITAMIN WITH MINERALS) TABS Take 1 tablet by mouth daily.    Historical Provider, MD   BP 133/70 mmHg  Pulse 76  Temp(Src) 98.4 F (36.9 C) (Oral)  Resp 16  SpO2 99% Physical Exam  Constitutional: She is oriented to person, place, and time. She appears well-developed.  HENT:  Head: Normocephalic.  Eyes: Conjunctivae are normal.  Neck: Neck supple.  Cardiovascular: Normal rate.   Pulmonary/Chest: Effort normal.  Abdominal: Soft.  Musculoskeletal:  Pt has mild, diffuse bruising to the front of her face. There is no gross deformity. No crepitus.  Head to toe evaluation shows no hematoma, bleeding of the scalp, no facial abrasions, step offs, crepitus, no tenderness to palpation of the bilateral upper and lower extremities, no gross deformities, no chest tenderness, no pelvic pain.   Neurological: She is alert and oriented to person, place, and time.  Nursing note and vitals reviewed.   ED Course  Procedures (including critical care time) Labs Review Labs Reviewed  URINALYSIS, ROUTINE W REFLEX MICROSCOPIC - Abnormal; Notable for the following:    Leukocytes, UA TRACE (*)    All other components within normal limits  URINE MICROSCOPIC-ADD ON    Imaging Review Ct Head Wo Contrast  04/30/2014   CLINICAL DATA:  Patient fell today and hit head and face. No loss of consciousness.  EXAM: CT HEAD WITHOUT CONTRAST  TECHNIQUE: Contiguous axial  images were obtained from the base of the skull through the vertex without intravenous contrast.  COMPARISON:  03/08/2012  FINDINGS: Stable age related cerebral atrophy, ventriculomegaly and periventricular white matter disease. No extra-axial fluid collections are identified. No CT findings for acute hemispheric infarction or intracranial hemorrhage. No mass lesions. The  brainstem and cerebellum are normal.  No acute fracture. Hyperostosis frontalis interna noted. The paranasal sinuses and mastoid air cells are clear. The globes are intact. No upper facial bone fractures.  IMPRESSION: Stable age related cerebral atrophy, ventriculomegaly and periventricular white matter disease.  No acute intracranial findings or skull fracture.   Electronically Signed   By: Loralie ChampagneMark  Gallerani M.D.   On: 04/30/2014 15:12     EKG Interpretation None      MDM   Final diagnoses:  Fall  Contusion    DDx includes: - Mechanical falls - [facility] - Fractures - Contusions - Soft tissue injury  Pt comes in with cc of fall. Head to toe eval shows no significant injuries. Pt is on pradaxa, has bruising to the head/face. CT head ordered, and is neg. Already ambulated, will d.c.  Derwood KaplanAnkit Nanavati, MD 05/01/14 [phone]

## 2014-05-02 DIAGNOSIS — R41841 Cognitive communication deficit: Secondary | ICD-10-CM | POA: Diagnosis not present

## 2014-05-06 DIAGNOSIS — R41841 Cognitive communication deficit: Secondary | ICD-10-CM | POA: Diagnosis not present

## 2014-05-07 DIAGNOSIS — R41841 Cognitive communication deficit: Secondary | ICD-10-CM | POA: Diagnosis not present

## 2014-05-12 DIAGNOSIS — R41841 Cognitive communication deficit: Secondary | ICD-10-CM | POA: Diagnosis not present

## 2014-05-14 DIAGNOSIS — R41841 Cognitive communication deficit: Secondary | ICD-10-CM | POA: Diagnosis not present

## 2014-05-14 DIAGNOSIS — R2681 Unsteadiness on feet: Secondary | ICD-10-CM | POA: Diagnosis not present

## 2014-05-19 DIAGNOSIS — R41841 Cognitive communication deficit: Secondary | ICD-10-CM | POA: Diagnosis not present

## 2014-05-19 DIAGNOSIS — R2681 Unsteadiness on feet: Secondary | ICD-10-CM | POA: Diagnosis not present

## 2014-05-21 DIAGNOSIS — R2681 Unsteadiness on feet: Secondary | ICD-10-CM | POA: Diagnosis not present

## 2014-05-21 DIAGNOSIS — R41841 Cognitive communication deficit: Secondary | ICD-10-CM | POA: Diagnosis not present

## 2014-05-26 DIAGNOSIS — R41841 Cognitive communication deficit: Secondary | ICD-10-CM | POA: Diagnosis not present

## 2014-05-26 DIAGNOSIS — R2681 Unsteadiness on feet: Secondary | ICD-10-CM | POA: Diagnosis not present

## 2014-05-28 DIAGNOSIS — R2681 Unsteadiness on feet: Secondary | ICD-10-CM | POA: Diagnosis not present

## 2014-05-28 DIAGNOSIS — R41841 Cognitive communication deficit: Secondary | ICD-10-CM | POA: Diagnosis not present

## 2014-06-02 DIAGNOSIS — R2681 Unsteadiness on feet: Secondary | ICD-10-CM | POA: Diagnosis not present

## 2014-06-02 DIAGNOSIS — R41841 Cognitive communication deficit: Secondary | ICD-10-CM | POA: Diagnosis not present

## 2014-06-04 DIAGNOSIS — R41841 Cognitive communication deficit: Secondary | ICD-10-CM | POA: Diagnosis not present

## 2014-06-04 DIAGNOSIS — R2681 Unsteadiness on feet: Secondary | ICD-10-CM | POA: Diagnosis not present

## 2014-06-09 DIAGNOSIS — R2681 Unsteadiness on feet: Secondary | ICD-10-CM | POA: Diagnosis not present

## 2014-06-09 DIAGNOSIS — R41841 Cognitive communication deficit: Secondary | ICD-10-CM | POA: Diagnosis not present

## 2014-06-11 DIAGNOSIS — R2681 Unsteadiness on feet: Secondary | ICD-10-CM | POA: Diagnosis not present

## 2014-06-11 DIAGNOSIS — R41841 Cognitive communication deficit: Secondary | ICD-10-CM | POA: Diagnosis not present

## 2014-06-12 DIAGNOSIS — R41841 Cognitive communication deficit: Secondary | ICD-10-CM | POA: Diagnosis not present

## 2014-06-12 DIAGNOSIS — R2681 Unsteadiness on feet: Secondary | ICD-10-CM | POA: Diagnosis not present

## 2014-06-16 DIAGNOSIS — R41841 Cognitive communication deficit: Secondary | ICD-10-CM | POA: Diagnosis not present

## 2014-06-16 DIAGNOSIS — R2681 Unsteadiness on feet: Secondary | ICD-10-CM | POA: Diagnosis not present

## 2014-06-17 DIAGNOSIS — R41841 Cognitive communication deficit: Secondary | ICD-10-CM | POA: Diagnosis not present

## 2014-06-17 DIAGNOSIS — R2681 Unsteadiness on feet: Secondary | ICD-10-CM | POA: Diagnosis not present

## 2014-06-18 DIAGNOSIS — R2681 Unsteadiness on feet: Secondary | ICD-10-CM | POA: Diagnosis not present

## 2014-06-18 DIAGNOSIS — R41841 Cognitive communication deficit: Secondary | ICD-10-CM | POA: Diagnosis not present

## 2014-06-19 DIAGNOSIS — R2681 Unsteadiness on feet: Secondary | ICD-10-CM | POA: Diagnosis not present

## 2014-06-19 DIAGNOSIS — R41841 Cognitive communication deficit: Secondary | ICD-10-CM | POA: Diagnosis not present

## 2014-06-23 DIAGNOSIS — R2681 Unsteadiness on feet: Secondary | ICD-10-CM | POA: Diagnosis not present

## 2014-06-23 DIAGNOSIS — R41841 Cognitive communication deficit: Secondary | ICD-10-CM | POA: Diagnosis not present

## 2014-06-24 DIAGNOSIS — R42 Dizziness and giddiness: Secondary | ICD-10-CM | POA: Diagnosis not present

## 2014-06-24 DIAGNOSIS — I482 Chronic atrial fibrillation: Secondary | ICD-10-CM | POA: Diagnosis not present

## 2014-06-24 DIAGNOSIS — M159 Polyosteoarthritis, unspecified: Secondary | ICD-10-CM | POA: Diagnosis not present

## 2014-06-25 DIAGNOSIS — R2681 Unsteadiness on feet: Secondary | ICD-10-CM | POA: Diagnosis not present

## 2014-06-25 DIAGNOSIS — R41841 Cognitive communication deficit: Secondary | ICD-10-CM | POA: Diagnosis not present

## 2014-06-26 DIAGNOSIS — R41841 Cognitive communication deficit: Secondary | ICD-10-CM | POA: Diagnosis not present

## 2014-06-26 DIAGNOSIS — R2681 Unsteadiness on feet: Secondary | ICD-10-CM | POA: Diagnosis not present

## 2014-06-30 DIAGNOSIS — R41841 Cognitive communication deficit: Secondary | ICD-10-CM | POA: Diagnosis not present

## 2014-06-30 DIAGNOSIS — R2681 Unsteadiness on feet: Secondary | ICD-10-CM | POA: Diagnosis not present

## 2014-07-02 DIAGNOSIS — R2681 Unsteadiness on feet: Secondary | ICD-10-CM | POA: Diagnosis not present

## 2014-07-02 DIAGNOSIS — R41841 Cognitive communication deficit: Secondary | ICD-10-CM | POA: Diagnosis not present

## 2014-07-07 DIAGNOSIS — R41841 Cognitive communication deficit: Secondary | ICD-10-CM | POA: Diagnosis not present

## 2014-07-07 DIAGNOSIS — R2681 Unsteadiness on feet: Secondary | ICD-10-CM | POA: Diagnosis not present

## 2014-07-09 DIAGNOSIS — R41841 Cognitive communication deficit: Secondary | ICD-10-CM | POA: Diagnosis not present

## 2014-07-09 DIAGNOSIS — R2681 Unsteadiness on feet: Secondary | ICD-10-CM | POA: Diagnosis not present

## 2014-07-14 DIAGNOSIS — R2681 Unsteadiness on feet: Secondary | ICD-10-CM | POA: Diagnosis not present

## 2014-07-14 DIAGNOSIS — R41841 Cognitive communication deficit: Secondary | ICD-10-CM | POA: Diagnosis not present

## 2014-07-16 DIAGNOSIS — R2681 Unsteadiness on feet: Secondary | ICD-10-CM | POA: Diagnosis not present

## 2014-07-16 DIAGNOSIS — R41841 Cognitive communication deficit: Secondary | ICD-10-CM | POA: Diagnosis not present

## 2014-09-18 DIAGNOSIS — I10 Essential (primary) hypertension: Secondary | ICD-10-CM | POA: Diagnosis not present

## 2014-09-18 DIAGNOSIS — E782 Mixed hyperlipidemia: Secondary | ICD-10-CM | POA: Diagnosis not present

## 2014-09-24 DIAGNOSIS — H4011X3 Primary open-angle glaucoma, severe stage: Secondary | ICD-10-CM | POA: Diagnosis not present

## 2014-09-25 DIAGNOSIS — E782 Mixed hyperlipidemia: Secondary | ICD-10-CM | POA: Diagnosis not present

## 2014-09-25 DIAGNOSIS — I10 Essential (primary) hypertension: Secondary | ICD-10-CM | POA: Diagnosis not present

## 2014-09-25 DIAGNOSIS — I482 Chronic atrial fibrillation: Secondary | ICD-10-CM | POA: Diagnosis not present

## 2014-09-25 DIAGNOSIS — Z780 Asymptomatic menopausal state: Secondary | ICD-10-CM | POA: Diagnosis not present

## 2014-10-20 DIAGNOSIS — L602 Onychogryphosis: Secondary | ICD-10-CM | POA: Diagnosis not present

## 2014-10-20 DIAGNOSIS — M2042 Other hammer toe(s) (acquired), left foot: Secondary | ICD-10-CM | POA: Diagnosis not present

## 2014-10-20 DIAGNOSIS — L84 Corns and callosities: Secondary | ICD-10-CM | POA: Diagnosis not present

## 2014-10-20 DIAGNOSIS — M2041 Other hammer toe(s) (acquired), right foot: Secondary | ICD-10-CM | POA: Diagnosis not present

## 2015-04-16 DIAGNOSIS — I482 Chronic atrial fibrillation: Secondary | ICD-10-CM | POA: Diagnosis not present

## 2015-04-16 DIAGNOSIS — H409 Unspecified glaucoma: Secondary | ICD-10-CM | POA: Diagnosis not present

## 2015-04-16 DIAGNOSIS — Z23 Encounter for immunization: Secondary | ICD-10-CM | POA: Diagnosis not present

## 2015-04-16 DIAGNOSIS — E782 Mixed hyperlipidemia: Secondary | ICD-10-CM | POA: Diagnosis not present

## 2015-04-16 DIAGNOSIS — N390 Urinary tract infection, site not specified: Secondary | ICD-10-CM | POA: Diagnosis not present

## 2015-04-16 DIAGNOSIS — E7800 Pure hypercholesterolemia, unspecified: Secondary | ICD-10-CM | POA: Diagnosis not present

## 2015-04-16 DIAGNOSIS — I10 Essential (primary) hypertension: Secondary | ICD-10-CM | POA: Diagnosis not present

## 2015-04-23 DIAGNOSIS — I482 Chronic atrial fibrillation: Secondary | ICD-10-CM | POA: Diagnosis not present

## 2015-04-23 DIAGNOSIS — I10 Essential (primary) hypertension: Secondary | ICD-10-CM | POA: Diagnosis not present

## 2015-04-23 DIAGNOSIS — M159 Polyosteoarthritis, unspecified: Secondary | ICD-10-CM | POA: Diagnosis not present

## 2015-04-23 DIAGNOSIS — E782 Mixed hyperlipidemia: Secondary | ICD-10-CM | POA: Diagnosis not present

## 2015-07-22 DIAGNOSIS — H401131 Primary open-angle glaucoma, bilateral, mild stage: Secondary | ICD-10-CM | POA: Diagnosis not present

## 2015-07-28 DIAGNOSIS — Z1231 Encounter for screening mammogram for malignant neoplasm of breast: Secondary | ICD-10-CM | POA: Diagnosis not present

## 2015-10-14 DIAGNOSIS — L84 Corns and callosities: Secondary | ICD-10-CM | POA: Diagnosis not present

## 2015-10-14 DIAGNOSIS — L97529 Non-pressure chronic ulcer of other part of left foot with unspecified severity: Secondary | ICD-10-CM | POA: Diagnosis not present

## 2015-10-14 DIAGNOSIS — M2042 Other hammer toe(s) (acquired), left foot: Secondary | ICD-10-CM | POA: Diagnosis not present

## 2015-10-14 DIAGNOSIS — L602 Onychogryphosis: Secondary | ICD-10-CM | POA: Diagnosis not present

## 2015-10-15 DIAGNOSIS — M159 Polyosteoarthritis, unspecified: Secondary | ICD-10-CM | POA: Diagnosis not present

## 2015-10-15 DIAGNOSIS — E782 Mixed hyperlipidemia: Secondary | ICD-10-CM | POA: Diagnosis not present

## 2015-10-15 DIAGNOSIS — I10 Essential (primary) hypertension: Secondary | ICD-10-CM | POA: Diagnosis not present

## 2015-10-28 DIAGNOSIS — I482 Chronic atrial fibrillation: Secondary | ICD-10-CM | POA: Diagnosis not present

## 2015-10-28 DIAGNOSIS — E782 Mixed hyperlipidemia: Secondary | ICD-10-CM | POA: Diagnosis not present

## 2015-10-28 DIAGNOSIS — M159 Polyosteoarthritis, unspecified: Secondary | ICD-10-CM | POA: Diagnosis not present

## 2015-10-28 DIAGNOSIS — I10 Essential (primary) hypertension: Secondary | ICD-10-CM | POA: Diagnosis not present

## 2015-10-30 DIAGNOSIS — M2042 Other hammer toe(s) (acquired), left foot: Secondary | ICD-10-CM | POA: Diagnosis not present

## 2015-12-30 DIAGNOSIS — M2042 Other hammer toe(s) (acquired), left foot: Secondary | ICD-10-CM | POA: Diagnosis not present

## 2016-03-25 DIAGNOSIS — R410 Disorientation, unspecified: Secondary | ICD-10-CM | POA: Diagnosis not present

## 2016-03-25 DIAGNOSIS — N390 Urinary tract infection, site not specified: Secondary | ICD-10-CM | POA: Diagnosis not present

## 2016-03-25 DIAGNOSIS — R300 Dysuria: Secondary | ICD-10-CM | POA: Diagnosis not present

## 2016-04-01 DIAGNOSIS — F0390 Unspecified dementia without behavioral disturbance: Secondary | ICD-10-CM | POA: Diagnosis not present

## 2016-04-01 DIAGNOSIS — Z23 Encounter for immunization: Secondary | ICD-10-CM | POA: Diagnosis not present

## 2016-05-11 DIAGNOSIS — E782 Mixed hyperlipidemia: Secondary | ICD-10-CM | POA: Diagnosis not present

## 2016-05-11 DIAGNOSIS — N390 Urinary tract infection, site not specified: Secondary | ICD-10-CM | POA: Diagnosis not present

## 2016-05-11 DIAGNOSIS — I10 Essential (primary) hypertension: Secondary | ICD-10-CM | POA: Diagnosis not present

## 2016-05-11 DIAGNOSIS — Z0000 Encounter for general adult medical examination without abnormal findings: Secondary | ICD-10-CM | POA: Diagnosis not present

## 2016-05-11 DIAGNOSIS — Z780 Asymptomatic menopausal state: Secondary | ICD-10-CM | POA: Diagnosis not present

## 2016-05-11 DIAGNOSIS — M159 Polyosteoarthritis, unspecified: Secondary | ICD-10-CM | POA: Diagnosis not present

## 2016-05-11 DIAGNOSIS — I482 Chronic atrial fibrillation: Secondary | ICD-10-CM | POA: Diagnosis not present

## 2016-05-18 DIAGNOSIS — E782 Mixed hyperlipidemia: Secondary | ICD-10-CM | POA: Diagnosis not present

## 2016-05-18 DIAGNOSIS — I482 Chronic atrial fibrillation: Secondary | ICD-10-CM | POA: Diagnosis not present

## 2016-05-18 DIAGNOSIS — M159 Polyosteoarthritis, unspecified: Secondary | ICD-10-CM | POA: Diagnosis not present

## 2016-05-18 DIAGNOSIS — F0390 Unspecified dementia without behavioral disturbance: Secondary | ICD-10-CM | POA: Diagnosis not present

## 2016-07-11 DIAGNOSIS — L309 Dermatitis, unspecified: Secondary | ICD-10-CM | POA: Diagnosis not present

## 2016-07-11 DIAGNOSIS — I872 Venous insufficiency (chronic) (peripheral): Secondary | ICD-10-CM | POA: Diagnosis not present

## 2016-07-11 DIAGNOSIS — R600 Localized edema: Secondary | ICD-10-CM | POA: Diagnosis not present

## 2016-07-19 DIAGNOSIS — I872 Venous insufficiency (chronic) (peripheral): Secondary | ICD-10-CM | POA: Diagnosis not present

## 2016-07-26 DIAGNOSIS — H401133 Primary open-angle glaucoma, bilateral, severe stage: Secondary | ICD-10-CM | POA: Diagnosis not present

## 2016-11-16 DIAGNOSIS — I482 Chronic atrial fibrillation: Secondary | ICD-10-CM | POA: Diagnosis not present

## 2016-11-16 DIAGNOSIS — E782 Mixed hyperlipidemia: Secondary | ICD-10-CM | POA: Diagnosis not present

## 2016-11-23 DIAGNOSIS — I872 Venous insufficiency (chronic) (peripheral): Secondary | ICD-10-CM | POA: Diagnosis not present

## 2016-11-23 DIAGNOSIS — I482 Chronic atrial fibrillation: Secondary | ICD-10-CM | POA: Diagnosis not present

## 2016-11-23 DIAGNOSIS — E782 Mixed hyperlipidemia: Secondary | ICD-10-CM | POA: Diagnosis not present

## 2016-11-23 DIAGNOSIS — I10 Essential (primary) hypertension: Secondary | ICD-10-CM | POA: Diagnosis not present

## 2017-01-03 DIAGNOSIS — H401133 Primary open-angle glaucoma, bilateral, severe stage: Secondary | ICD-10-CM | POA: Diagnosis not present

## 2017-04-14 DIAGNOSIS — Z23 Encounter for immunization: Secondary | ICD-10-CM | POA: Diagnosis not present

## 2017-05-08 DIAGNOSIS — E782 Mixed hyperlipidemia: Secondary | ICD-10-CM | POA: Diagnosis not present

## 2017-05-08 DIAGNOSIS — N390 Urinary tract infection, site not specified: Secondary | ICD-10-CM | POA: Diagnosis not present

## 2017-05-08 DIAGNOSIS — Z0000 Encounter for general adult medical examination without abnormal findings: Secondary | ICD-10-CM | POA: Diagnosis not present

## 2017-05-08 DIAGNOSIS — R5383 Other fatigue: Secondary | ICD-10-CM | POA: Diagnosis not present

## 2017-05-08 DIAGNOSIS — I10 Essential (primary) hypertension: Secondary | ICD-10-CM | POA: Diagnosis not present

## 2017-05-18 DIAGNOSIS — F0390 Unspecified dementia without behavioral disturbance: Secondary | ICD-10-CM | POA: Diagnosis not present

## 2017-05-18 DIAGNOSIS — I872 Venous insufficiency (chronic) (peripheral): Secondary | ICD-10-CM | POA: Diagnosis not present

## 2017-05-18 DIAGNOSIS — M159 Polyosteoarthritis, unspecified: Secondary | ICD-10-CM | POA: Diagnosis not present

## 2017-05-18 DIAGNOSIS — I482 Chronic atrial fibrillation: Secondary | ICD-10-CM | POA: Diagnosis not present

## 2017-05-18 DIAGNOSIS — I10 Essential (primary) hypertension: Secondary | ICD-10-CM | POA: Diagnosis not present

## 2017-05-18 DIAGNOSIS — I441 Atrioventricular block, second degree: Secondary | ICD-10-CM | POA: Diagnosis not present

## 2017-05-18 DIAGNOSIS — H409 Unspecified glaucoma: Secondary | ICD-10-CM | POA: Diagnosis not present

## 2017-05-18 DIAGNOSIS — E782 Mixed hyperlipidemia: Secondary | ICD-10-CM | POA: Diagnosis not present

## 2017-05-30 DIAGNOSIS — I482 Chronic atrial fibrillation: Secondary | ICD-10-CM | POA: Diagnosis not present

## 2017-05-30 DIAGNOSIS — I10 Essential (primary) hypertension: Secondary | ICD-10-CM | POA: Diagnosis not present

## 2017-05-30 DIAGNOSIS — E782 Mixed hyperlipidemia: Secondary | ICD-10-CM | POA: Diagnosis not present

## 2017-05-30 DIAGNOSIS — I4892 Unspecified atrial flutter: Secondary | ICD-10-CM | POA: Diagnosis not present

## 2017-08-08 DIAGNOSIS — H401131 Primary open-angle glaucoma, bilateral, mild stage: Secondary | ICD-10-CM | POA: Diagnosis not present

## 2017-11-14 DIAGNOSIS — E782 Mixed hyperlipidemia: Secondary | ICD-10-CM | POA: Diagnosis not present

## 2017-11-14 DIAGNOSIS — I10 Essential (primary) hypertension: Secondary | ICD-10-CM | POA: Diagnosis not present

## 2017-11-14 DIAGNOSIS — I482 Chronic atrial fibrillation: Secondary | ICD-10-CM | POA: Diagnosis not present

## 2017-11-21 DIAGNOSIS — F0390 Unspecified dementia without behavioral disturbance: Secondary | ICD-10-CM | POA: Diagnosis not present

## 2017-11-21 DIAGNOSIS — I10 Essential (primary) hypertension: Secondary | ICD-10-CM | POA: Diagnosis not present

## 2017-11-21 DIAGNOSIS — E782 Mixed hyperlipidemia: Secondary | ICD-10-CM | POA: Diagnosis not present

## 2017-11-21 DIAGNOSIS — I482 Chronic atrial fibrillation: Secondary | ICD-10-CM | POA: Diagnosis not present

## 2018-03-30 DIAGNOSIS — Z23 Encounter for immunization: Secondary | ICD-10-CM | POA: Diagnosis not present

## 2018-05-22 DIAGNOSIS — I482 Chronic atrial fibrillation, unspecified: Secondary | ICD-10-CM | POA: Diagnosis not present

## 2018-05-22 DIAGNOSIS — I10 Essential (primary) hypertension: Secondary | ICD-10-CM | POA: Diagnosis not present

## 2018-05-22 DIAGNOSIS — E782 Mixed hyperlipidemia: Secondary | ICD-10-CM | POA: Diagnosis not present

## 2018-05-22 DIAGNOSIS — Z780 Asymptomatic menopausal state: Secondary | ICD-10-CM | POA: Diagnosis not present

## 2018-05-22 DIAGNOSIS — Z0000 Encounter for general adult medical examination without abnormal findings: Secondary | ICD-10-CM | POA: Diagnosis not present

## 2018-05-22 LAB — CBC AND DIFFERENTIAL
Hemoglobin: 12.8 (ref 12.0–16.0)
Platelets: 203 (ref 150–399)
WBC: 6.3

## 2018-05-22 LAB — CBC: RBC: 4.22 (ref 3.87–5.11)

## 2018-05-31 DIAGNOSIS — F0390 Unspecified dementia without behavioral disturbance: Secondary | ICD-10-CM | POA: Diagnosis not present

## 2018-05-31 DIAGNOSIS — I872 Venous insufficiency (chronic) (peripheral): Secondary | ICD-10-CM | POA: Diagnosis not present

## 2018-05-31 DIAGNOSIS — I10 Essential (primary) hypertension: Secondary | ICD-10-CM | POA: Diagnosis not present

## 2018-05-31 DIAGNOSIS — E782 Mixed hyperlipidemia: Secondary | ICD-10-CM | POA: Diagnosis not present

## 2018-05-31 DIAGNOSIS — I4821 Permanent atrial fibrillation: Secondary | ICD-10-CM | POA: Diagnosis not present

## 2018-05-31 DIAGNOSIS — M159 Polyosteoarthritis, unspecified: Secondary | ICD-10-CM | POA: Diagnosis not present

## 2018-06-11 DIAGNOSIS — R41841 Cognitive communication deficit: Secondary | ICD-10-CM | POA: Diagnosis not present

## 2018-06-19 DIAGNOSIS — R41841 Cognitive communication deficit: Secondary | ICD-10-CM | POA: Diagnosis not present

## 2018-06-21 DIAGNOSIS — R41841 Cognitive communication deficit: Secondary | ICD-10-CM | POA: Diagnosis not present

## 2018-06-25 DIAGNOSIS — R41841 Cognitive communication deficit: Secondary | ICD-10-CM | POA: Diagnosis not present

## 2018-06-27 DIAGNOSIS — R41841 Cognitive communication deficit: Secondary | ICD-10-CM | POA: Diagnosis not present

## 2018-06-29 DIAGNOSIS — R41841 Cognitive communication deficit: Secondary | ICD-10-CM | POA: Diagnosis not present

## 2018-07-03 DIAGNOSIS — R41841 Cognitive communication deficit: Secondary | ICD-10-CM | POA: Diagnosis not present

## 2018-07-05 DIAGNOSIS — R41841 Cognitive communication deficit: Secondary | ICD-10-CM | POA: Diagnosis not present

## 2018-07-09 DIAGNOSIS — R41841 Cognitive communication deficit: Secondary | ICD-10-CM | POA: Diagnosis not present

## 2018-07-11 DIAGNOSIS — R41841 Cognitive communication deficit: Secondary | ICD-10-CM | POA: Diagnosis not present

## 2018-07-13 DIAGNOSIS — R41841 Cognitive communication deficit: Secondary | ICD-10-CM | POA: Diagnosis not present

## 2018-07-18 DIAGNOSIS — R41841 Cognitive communication deficit: Secondary | ICD-10-CM | POA: Diagnosis not present

## 2018-07-31 DIAGNOSIS — R41841 Cognitive communication deficit: Secondary | ICD-10-CM | POA: Diagnosis not present

## 2018-08-06 DIAGNOSIS — R41841 Cognitive communication deficit: Secondary | ICD-10-CM | POA: Diagnosis not present

## 2018-08-09 DIAGNOSIS — R41841 Cognitive communication deficit: Secondary | ICD-10-CM | POA: Diagnosis not present

## 2018-08-10 DIAGNOSIS — H353131 Nonexudative age-related macular degeneration, bilateral, early dry stage: Secondary | ICD-10-CM | POA: Diagnosis not present

## 2018-11-09 DIAGNOSIS — I482 Chronic atrial fibrillation, unspecified: Secondary | ICD-10-CM | POA: Diagnosis not present

## 2018-11-09 DIAGNOSIS — I4821 Permanent atrial fibrillation: Secondary | ICD-10-CM | POA: Diagnosis not present

## 2018-11-09 DIAGNOSIS — I10 Essential (primary) hypertension: Secondary | ICD-10-CM | POA: Diagnosis not present

## 2018-11-09 DIAGNOSIS — E782 Mixed hyperlipidemia: Secondary | ICD-10-CM | POA: Diagnosis not present

## 2018-11-09 DIAGNOSIS — R05 Cough: Secondary | ICD-10-CM | POA: Diagnosis not present

## 2018-11-09 LAB — BASIC METABOLIC PANEL
BUN: 36 — AB (ref 4–21)
CO2: 31 — AB (ref 13–22)
Creatinine: 1.2 — AB (ref 0.5–1.1)
Glucose: 93

## 2018-11-09 LAB — COMPREHENSIVE METABOLIC PANEL
Calcium: 9.6 (ref 8.7–10.7)
GFR calc Af Amer: 52.43
GFR calc non Af Amer: 43.26
Globulin: 2.9

## 2018-11-09 LAB — HEPATIC FUNCTION PANEL
ALT: 20 (ref 7–35)
AST: 17 (ref 13–35)

## 2018-11-09 LAB — CBC AND DIFFERENTIAL: Neutrophils Absolute: 139

## 2018-11-15 DIAGNOSIS — H6123 Impacted cerumen, bilateral: Secondary | ICD-10-CM | POA: Diagnosis not present

## 2018-11-15 DIAGNOSIS — E782 Mixed hyperlipidemia: Secondary | ICD-10-CM | POA: Diagnosis not present

## 2018-11-15 DIAGNOSIS — Z7189 Other specified counseling: Secondary | ICD-10-CM | POA: Diagnosis not present

## 2018-11-15 DIAGNOSIS — I482 Chronic atrial fibrillation, unspecified: Secondary | ICD-10-CM | POA: Diagnosis not present

## 2018-11-15 DIAGNOSIS — I10 Essential (primary) hypertension: Secondary | ICD-10-CM | POA: Diagnosis not present

## 2018-12-11 DIAGNOSIS — B351 Tinea unguium: Secondary | ICD-10-CM | POA: Diagnosis not present

## 2018-12-11 DIAGNOSIS — M79672 Pain in left foot: Secondary | ICD-10-CM | POA: Diagnosis not present

## 2018-12-11 DIAGNOSIS — M79671 Pain in right foot: Secondary | ICD-10-CM | POA: Diagnosis not present

## 2018-12-11 DIAGNOSIS — M2042 Other hammer toe(s) (acquired), left foot: Secondary | ICD-10-CM | POA: Diagnosis not present

## 2018-12-11 DIAGNOSIS — M2041 Other hammer toe(s) (acquired), right foot: Secondary | ICD-10-CM | POA: Diagnosis not present

## 2018-12-11 DIAGNOSIS — E1151 Type 2 diabetes mellitus with diabetic peripheral angiopathy without gangrene: Secondary | ICD-10-CM | POA: Diagnosis not present

## 2018-12-13 DIAGNOSIS — M150 Primary generalized (osteo)arthritis: Secondary | ICD-10-CM | POA: Diagnosis not present

## 2018-12-13 DIAGNOSIS — M1990 Unspecified osteoarthritis, unspecified site: Secondary | ICD-10-CM | POA: Diagnosis not present

## 2018-12-13 DIAGNOSIS — R2681 Unsteadiness on feet: Secondary | ICD-10-CM | POA: Diagnosis not present

## 2018-12-13 DIAGNOSIS — R41841 Cognitive communication deficit: Secondary | ICD-10-CM | POA: Diagnosis not present

## 2018-12-13 DIAGNOSIS — M6281 Muscle weakness (generalized): Secondary | ICD-10-CM | POA: Diagnosis not present

## 2018-12-14 DIAGNOSIS — M1990 Unspecified osteoarthritis, unspecified site: Secondary | ICD-10-CM | POA: Diagnosis not present

## 2018-12-14 DIAGNOSIS — R41841 Cognitive communication deficit: Secondary | ICD-10-CM | POA: Diagnosis not present

## 2018-12-14 DIAGNOSIS — M6281 Muscle weakness (generalized): Secondary | ICD-10-CM | POA: Diagnosis not present

## 2018-12-14 DIAGNOSIS — R2681 Unsteadiness on feet: Secondary | ICD-10-CM | POA: Diagnosis not present

## 2018-12-14 DIAGNOSIS — M150 Primary generalized (osteo)arthritis: Secondary | ICD-10-CM | POA: Diagnosis not present

## 2018-12-17 DIAGNOSIS — M1990 Unspecified osteoarthritis, unspecified site: Secondary | ICD-10-CM | POA: Diagnosis not present

## 2018-12-17 DIAGNOSIS — R41841 Cognitive communication deficit: Secondary | ICD-10-CM | POA: Diagnosis not present

## 2018-12-17 DIAGNOSIS — M150 Primary generalized (osteo)arthritis: Secondary | ICD-10-CM | POA: Diagnosis not present

## 2018-12-17 DIAGNOSIS — R2681 Unsteadiness on feet: Secondary | ICD-10-CM | POA: Diagnosis not present

## 2018-12-17 DIAGNOSIS — M6281 Muscle weakness (generalized): Secondary | ICD-10-CM | POA: Diagnosis not present

## 2018-12-18 DIAGNOSIS — R41841 Cognitive communication deficit: Secondary | ICD-10-CM | POA: Diagnosis not present

## 2018-12-18 DIAGNOSIS — R2681 Unsteadiness on feet: Secondary | ICD-10-CM | POA: Diagnosis not present

## 2018-12-18 DIAGNOSIS — M1990 Unspecified osteoarthritis, unspecified site: Secondary | ICD-10-CM | POA: Diagnosis not present

## 2018-12-18 DIAGNOSIS — M6281 Muscle weakness (generalized): Secondary | ICD-10-CM | POA: Diagnosis not present

## 2018-12-18 DIAGNOSIS — M150 Primary generalized (osteo)arthritis: Secondary | ICD-10-CM | POA: Diagnosis not present

## 2018-12-19 DIAGNOSIS — R41841 Cognitive communication deficit: Secondary | ICD-10-CM | POA: Diagnosis not present

## 2018-12-19 DIAGNOSIS — M6281 Muscle weakness (generalized): Secondary | ICD-10-CM | POA: Diagnosis not present

## 2018-12-19 DIAGNOSIS — R2681 Unsteadiness on feet: Secondary | ICD-10-CM | POA: Diagnosis not present

## 2018-12-19 DIAGNOSIS — M150 Primary generalized (osteo)arthritis: Secondary | ICD-10-CM | POA: Diagnosis not present

## 2018-12-19 DIAGNOSIS — M1990 Unspecified osteoarthritis, unspecified site: Secondary | ICD-10-CM | POA: Diagnosis not present

## 2018-12-20 DIAGNOSIS — M1990 Unspecified osteoarthritis, unspecified site: Secondary | ICD-10-CM | POA: Diagnosis not present

## 2018-12-20 DIAGNOSIS — M150 Primary generalized (osteo)arthritis: Secondary | ICD-10-CM | POA: Diagnosis not present

## 2018-12-20 DIAGNOSIS — R2681 Unsteadiness on feet: Secondary | ICD-10-CM | POA: Diagnosis not present

## 2018-12-20 DIAGNOSIS — R41841 Cognitive communication deficit: Secondary | ICD-10-CM | POA: Diagnosis not present

## 2018-12-20 DIAGNOSIS — M6281 Muscle weakness (generalized): Secondary | ICD-10-CM | POA: Diagnosis not present

## 2018-12-21 DIAGNOSIS — M6281 Muscle weakness (generalized): Secondary | ICD-10-CM | POA: Diagnosis not present

## 2018-12-21 DIAGNOSIS — M1990 Unspecified osteoarthritis, unspecified site: Secondary | ICD-10-CM | POA: Diagnosis not present

## 2018-12-21 DIAGNOSIS — M150 Primary generalized (osteo)arthritis: Secondary | ICD-10-CM | POA: Diagnosis not present

## 2018-12-21 DIAGNOSIS — R2681 Unsteadiness on feet: Secondary | ICD-10-CM | POA: Diagnosis not present

## 2018-12-21 DIAGNOSIS — R41841 Cognitive communication deficit: Secondary | ICD-10-CM | POA: Diagnosis not present

## 2018-12-23 ENCOUNTER — Other Ambulatory Visit: Payer: Self-pay

## 2018-12-23 ENCOUNTER — Emergency Department (HOSPITAL_COMMUNITY)
Admission: EM | Admit: 2018-12-23 | Discharge: 2018-12-23 | Disposition: A | Discharge status: Home / Self Care | Payer: Medicare Other | Attending: Emergency Medicine | Admitting: Emergency Medicine

## 2018-12-23 ENCOUNTER — Encounter (HOSPITAL_COMMUNITY): Payer: Self-pay

## 2018-12-23 ENCOUNTER — Emergency Department (HOSPITAL_COMMUNITY): Payer: Medicare Other

## 2018-12-23 DIAGNOSIS — Z7401 Bed confinement status: Secondary | ICD-10-CM | POA: Diagnosis not present

## 2018-12-23 DIAGNOSIS — T07XXXA Unspecified multiple injuries, initial encounter: Secondary | ICD-10-CM | POA: Diagnosis present

## 2018-12-23 DIAGNOSIS — M47812 Spondylosis without myelopathy or radiculopathy, cervical region: Secondary | ICD-10-CM | POA: Diagnosis not present

## 2018-12-23 DIAGNOSIS — S01112A Laceration without foreign body of left eyelid and periocular area, initial encounter: Secondary | ICD-10-CM

## 2018-12-23 DIAGNOSIS — M2550 Pain in unspecified joint: Secondary | ICD-10-CM | POA: Diagnosis not present

## 2018-12-23 DIAGNOSIS — R9431 Abnormal electrocardiogram [ECG] [EKG]: Secondary | ICD-10-CM | POA: Diagnosis not present

## 2018-12-23 DIAGNOSIS — I10 Essential (primary) hypertension: Secondary | ICD-10-CM | POA: Diagnosis not present

## 2018-12-23 DIAGNOSIS — Z23 Encounter for immunization: Secondary | ICD-10-CM | POA: Diagnosis not present

## 2018-12-23 DIAGNOSIS — S065X0A Traumatic subdural hemorrhage without loss of consciousness, initial encounter: Secondary | ICD-10-CM

## 2018-12-23 DIAGNOSIS — M4802 Spinal stenosis, cervical region: Secondary | ICD-10-CM | POA: Diagnosis not present

## 2018-12-23 DIAGNOSIS — Y999 Unspecified external cause status: Secondary | ICD-10-CM | POA: Insufficient documentation

## 2018-12-23 DIAGNOSIS — I959 Hypotension, unspecified: Secondary | ICD-10-CM | POA: Diagnosis not present

## 2018-12-23 DIAGNOSIS — W19XXXA Unspecified fall, initial encounter: Secondary | ICD-10-CM

## 2018-12-23 DIAGNOSIS — W1830XA Fall on same level, unspecified, initial encounter: Secondary | ICD-10-CM | POA: Insufficient documentation

## 2018-12-23 DIAGNOSIS — S0012XA Contusion of left eyelid and periocular area, initial encounter: Secondary | ICD-10-CM | POA: Diagnosis not present

## 2018-12-23 DIAGNOSIS — F0390 Unspecified dementia without behavioral disturbance: Secondary | ICD-10-CM | POA: Diagnosis not present

## 2018-12-23 DIAGNOSIS — Y939 Activity, unspecified: Secondary | ICD-10-CM | POA: Diagnosis not present

## 2018-12-23 DIAGNOSIS — Y92129 Unspecified place in nursing home as the place of occurrence of the external cause: Secondary | ICD-10-CM | POA: Diagnosis not present

## 2018-12-23 DIAGNOSIS — S0083XA Contusion of other part of head, initial encounter: Secondary | ICD-10-CM | POA: Diagnosis not present

## 2018-12-23 DIAGNOSIS — R58 Hemorrhage, not elsewhere classified: Secondary | ICD-10-CM | POA: Diagnosis not present

## 2018-12-23 DIAGNOSIS — I6521 Occlusion and stenosis of right carotid artery: Secondary | ICD-10-CM | POA: Diagnosis not present

## 2018-12-23 DIAGNOSIS — R410 Disorientation, unspecified: Secondary | ICD-10-CM | POA: Diagnosis not present

## 2018-12-23 DIAGNOSIS — S0512XA Contusion of eyeball and orbital tissues, left eye, initial encounter: Secondary | ICD-10-CM | POA: Diagnosis not present

## 2018-12-23 DIAGNOSIS — S065X9A Traumatic subdural hemorrhage with loss of consciousness of unspecified duration, initial encounter: Secondary | ICD-10-CM | POA: Diagnosis not present

## 2018-12-23 HISTORY — DX: Unspecified atrial fibrillation: I48.91

## 2018-12-23 HISTORY — DX: Dementia in other diseases classified elsewhere, unspecified severity, without behavioral disturbance, psychotic disturbance, mood disturbance, and anxiety: F02.80

## 2018-12-23 MED ORDER — TETANUS-DIPHTH-ACELL PERTUSSIS 5-2.5-18.5 LF-MCG/0.5 IM SUSP
0.5000 mL | Freq: Once | INTRAMUSCULAR | Status: AC
  Administered 2018-12-23: 0.5 mL via INTRAMUSCULAR
  Filled 2018-12-23: qty 0.5

## 2018-12-23 MED ORDER — LIDOCAINE-EPINEPHRINE-TETRACAINE (LET) SOLUTION
3.0000 mL | Freq: Once | NASAL | Status: AC
  Administered 2018-12-23: 3 mL via TOPICAL
  Filled 2018-12-23: qty 3

## 2018-12-23 NOTE — ED Notes (Signed)
Called [facility] for transportation  

## 2018-12-23 NOTE — ED Notes (Signed)
Jasmine Woodward (son): [phone]

## 2018-12-23 NOTE — ED Triage Notes (Signed)
Patient arrived via [facility] due to having an unwitnessed fall at nursing home. Patient has a laceration on left eyebrow and a hematoma in same location.

## 2018-12-23 NOTE — Discharge Instructions (Addendum)
You have a laceration to your left eyebrow from your fall today and the sutures will need to be removed in 5 days.  The CAT scan showed no signs of fracture of your skull or your face but did show a trace amount of bleeding in the brain from the fall.  Neurosurgery evaluated this scan and do not feel there is any further imaging that is needed.  If there is change in behavior, new vomiting or difficulty moving 1 side of the body should return to the emergency room immediately.  At this time you can continue your Pradaxa.

## 2018-12-23 NOTE — ED Notes (Addendum)
Pt has memory impairment and unable to sign D/C.

## 2018-12-23 NOTE — ED Provider Notes (Signed)
[facility] Provider Note   CSN: [id number] Arrival date & time: 12/23/18  2006     History   Chief Complaint Chief Complaint  Patient presents with  . Fall    HPI Jasmine Woodward is a 85 y.o. female.     The history is provided by the patient.  Fall This is a new problem. Episode onset: unknown as was not witnessed. The problem occurs constantly. The problem has not changed since onset.Associated symptoms comments: Bruising and bleeding from the left eyebrow area.  Pt at baseline per facility does have hx of dementia but awake and alert.  Patient states she cannot remember why she fell.  She states she does not remember those things because she has dementia.  She denies any neck pain, chest pain, arm or leg pain.  She denies shortness of breath or cough.  She denies any visual changes.  She states she usually just walks around by holding onto the side of the wall but the facility reports nobody witnessed her fall today..    Past Medical History:  Diagnosis Date  . Alzheimer disease (HCC)   . Atrial fibrillation (HCC)     There are no active problems to display for this patient.      OB History   No obstetric history on file.      Home Medications    Prior to Admission medications   Not on File    Family History No family history on file.  Social History Social History   Tobacco Use  . Smoking status: Never Smoker  Substance Use Topics  . Alcohol use: Not Currently  . Drug use: Not on file     Allergies   Patient has no allergy information on record.   Review of Systems Review of Systems  Unable to perform ROS: Dementia     Physical Exam Updated Vital Signs BP (!) 168/59 (BP Location: Left Arm)   Pulse (!) 59   Temp 98.4 F (36.9 C) (Oral)   Resp 15   Ht 5\' 4"  (1.626 m)   Wt 64 kg   SpO2 100%   BMI 24.24 kg/m   Physical Exam Vitals signs and nursing note reviewed.  Constitutional:    General: She is not in acute distress.    Appearance: She is well-developed and normal weight.  HENT:     Head: Normocephalic. Contusion and laceration present.   Eyes:     Pupils: Pupils are equal, round, and reactive to light.  Cardiovascular:     Rate and Rhythm: Normal rate and regular rhythm.     Heart sounds: Normal heart sounds. No murmur. No friction rub.  Pulmonary:     Effort: Pulmonary effort is normal.     Breath sounds: Normal breath sounds. No wheezing or rales.  Abdominal:     General: Bowel sounds are normal. There is no distension.     Palpations: Abdomen is soft.     Tenderness: There is no abdominal tenderness. There is no guarding or rebound.  Musculoskeletal: Normal range of motion.        General: No tenderness.     Right shoulder: Normal.     Left shoulder: Normal.     Right wrist: Normal.     Left wrist: Normal.     Right hip: Normal.     Left hip: Normal.     Comments: No edema  Skin:    General: Skin is warm and dry.  Findings: No rash.  Neurological:     General: No focal deficit present.     Mental Status: She is alert. Mental status is at baseline.     Cranial Nerves: No cranial nerve deficit.  Psychiatric:        Mood and Affect: Mood normal.        Behavior: Behavior normal.      ED Treatments / Results  Labs (all labs ordered are listed, but only abnormal results are displayed) Labs Reviewed - No data to display  EKG None  Radiology Ct Head Wo Contrast  Addendum Date: 12/23/2018   ADDENDUM REPORT: 12/23/2018 21:09 ADDENDUM: Study discussed by telephone with Dr. Gwyneth SproutWHITNEY PLUNKETT on 12/23/2018 at 2107 hours. Electronically Signed   By: Odessa FlemingH  Hall M.D.   On: 12/23/2018 21:09   Result Date: 12/23/2018 CLINICAL DATA:  85 year old female status post unwitnessed fall. Laceration hematoma to left eyebrow. EXAM: CT HEAD WITHOUT CONTRAST CT MAXILLOFACIAL WITHOUT CONTRAST TECHNIQUE: Multidetector CT imaging of the head and maxillofacial  structures were performed using the standard protocol without intravenous contrast. Multiplanar CT image reconstructions of the maxillofacial structures were also generated. COMPARISON:  Head CT 03/08/2012, 04/30/2014. FINDINGS: CT HEAD FINDINGS Brain: Positive for trace para falcine subdural hematoma (coronal image 43 series 5. No other subdural blood identified. No intracranial mass effect. No midline shift. No other acute intracranial hemorrhage identified. Patchy bilateral white matter hypodensity has not significantly changed. There is biparietal disproportionate cerebral volume loss which also appears stable. Overall cerebral volume has not significantly changed since 2015. No ventriculomegaly. No cortically based acute infarct identified. Vascular: Calcified atherosclerosis at the skull base. No suspicious intracranial vascular hyperdensity. Skull: Calvarium appears stable and intact with hyperostosis. Other: Mild left forehead contusion. See face soft tissue findings below. Other scalp soft tissues are within normal limits. CT MAXILLOFACIAL FINDINGS Osseous: Absent dentition. TMJ degeneration. Mandible intact. No maxilla or zygoma fracture identified. No nasal bone fracture. Central skull base appears intact. Visible cervical vertebrae appear grossly intact with advanced degenerative changes. Degenerative cervical spinal stenosis, the C5-6 level appears most stenotic. Orbits: No orbital wall fracture. Globes appear symmetric and intact. No intraorbital soft tissue abnormality. Superficial left periorbital, premalar and lateral face superficial hematoma with trace soft tissue gas (series 8, image 55). Sinuses: Well pneumatized. Occasional paranasal sinus mucosal thickening and periosteal thickening, including in the right sphenoid. Visible tympanic cavities and mastoids are well pneumatized. Soft tissues: Negative visible noncontrast larynx, pharynx, parapharyngeal spaces, retropharyngeal space, sublingual  space, submandibular spaces, parotid spaces, masticator spaces. Calcified carotid atherosclerosis greater on the right. No upper cervical lymphadenopathy. IMPRESSION: 1. Trace parafalcine subdural hematoma suspected. No associated mass effect and no other acute intracranial abnormality. 2. Left face and periorbital superficial soft tissue hematoma. No face or skull fracture identified. 3. Chronic biparietal volume loss and cerebral white matter changes. Chronic cervical spine degeneration with spinal stenosis. Electronically Signed: By: Odessa FlemingH  Hall M.D. On: 12/23/2018 20:51   Ct Maxillofacial Wo Contrast  Addendum Date: 12/23/2018   ADDENDUM REPORT: 12/23/2018 21:09 ADDENDUM: Study discussed by telephone with Dr. Gwyneth SproutWHITNEY PLUNKETT on 12/23/2018 at 2107 hours. Electronically Signed   By: Odessa FlemingH  Hall M.D.   On: 12/23/2018 21:09   Result Date: 12/23/2018 CLINICAL DATA:  85 year old female status post unwitnessed fall. Laceration hematoma to left eyebrow. EXAM: CT HEAD WITHOUT CONTRAST CT MAXILLOFACIAL WITHOUT CONTRAST TECHNIQUE: Multidetector CT imaging of the head and maxillofacial structures were performed using the standard protocol without intravenous contrast. Multiplanar CT image  reconstructions of the maxillofacial structures were also generated. COMPARISON:  Head CT 03/08/2012, 04/30/2014. FINDINGS: CT HEAD FINDINGS Brain: Positive for trace para falcine subdural hematoma (coronal image 43 series 5. No other subdural blood identified. No intracranial mass effect. No midline shift. No other acute intracranial hemorrhage identified. Patchy bilateral white matter hypodensity has not significantly changed. There is biparietal disproportionate cerebral volume loss which also appears stable. Overall cerebral volume has not significantly changed since 2015. No ventriculomegaly. No cortically based acute infarct identified. Vascular: Calcified atherosclerosis at the skull base. No suspicious intracranial vascular  hyperdensity. Skull: Calvarium appears stable and intact with hyperostosis. Other: Mild left forehead contusion. See face soft tissue findings below. Other scalp soft tissues are within normal limits. CT MAXILLOFACIAL FINDINGS Osseous: Absent dentition. TMJ degeneration. Mandible intact. No maxilla or zygoma fracture identified. No nasal bone fracture. Central skull base appears intact. Visible cervical vertebrae appear grossly intact with advanced degenerative changes. Degenerative cervical spinal stenosis, the C5-6 level appears most stenotic. Orbits: No orbital wall fracture. Globes appear symmetric and intact. No intraorbital soft tissue abnormality. Superficial left periorbital, premalar and lateral face superficial hematoma with trace soft tissue gas (series 8, image 55). Sinuses: Well pneumatized. Occasional paranasal sinus mucosal thickening and periosteal thickening, including in the right sphenoid. Visible tympanic cavities and mastoids are well pneumatized. Soft tissues: Negative visible noncontrast larynx, pharynx, parapharyngeal spaces, retropharyngeal space, sublingual space, submandibular spaces, parotid spaces, masticator spaces. Calcified carotid atherosclerosis greater on the right. No upper cervical lymphadenopathy. IMPRESSION: 1. Trace parafalcine subdural hematoma suspected. No associated mass effect and no other acute intracranial abnormality. 2. Left face and periorbital superficial soft tissue hematoma. No face or skull fracture identified. 3. Chronic biparietal volume loss and cerebral white matter changes. Chronic cervical spine degeneration with spinal stenosis. Electronically Signed: By: Odessa FlemingH  Hall M.D. On: 12/23/2018 20:51    Procedures Procedures (including critical care time)  LACERATION REPAIR Performed by: Caremark RxWhitney Plunkett Authorized by: Gwyneth SproutWhitney Plunkett Consent: Verbal consent obtained. Risks and benefits: risks, benefits and alternatives were discussed Consent given by:  patient Patient identity confirmed: provided demographic data Prepped and Draped in normal sterile fashion Wound explored  Laceration Location: left eyebrow  Laceration Length: 2cm  No Foreign Bodies seen or palpated  Anesthesia: topical  Local anesthetic:LET Anesthetic total: 1 ml  Irrigation method: syringe Amount of cleaning: standard  Skin closure: 6.0 prolene  Number of sutures: 3  Technique: simple interrupted  Patient tolerance: Patient tolerated the procedure well with no immediate complications.   Medications Ordered in ED Medications  lidocaine-EPINEPHrine-tetracaine (LET) solution (has no administration in time range)  Tdap (BOOSTRIX) injection 0.5 mL (has no administration in time range)     Initial Impression / Assessment and Plan / ED Course  I have reviewed the triage vital signs and the nursing notes.  Pertinent labs & imaging results that were available during my care of the patient were reviewed by me and considered in my medical decision making (see chart for details).       Elderly female presenting today with fall that was unwitnessed at her nursing facility.  She has evidence of trauma to the left eye and eyebrow.  She is awake and alert at this time.  Patient cannot remember what happened but states that because she has dementia.  She has no chest pain, shortness of breath or other complaints.  Feel that this is most likely mechanical because she says she usually has to hold the wall to walk.  Patient does take Pradaxa for atrial fibrillation.  Vital signs today are within normal limits except for some hypertension. Facial CT is negative for acute fracture but head CT shows trace parafalcine subdural hematoma suspected.  No associated mass-effect and no other acute intercranial abnormality.  Discussed this with neurosurgery Meyran who evaluated the a CT and stated patient needs no further follow-up and from their standpoint can go back to the nursing  home and can continue their pradaxa.  She does not need repeat imaging unless she has change in mental status.  Patient's wound was sutured as above.  She was discharged back to her facility.  Final Clinical Impressions(s) / ED Diagnoses   Final diagnoses:  Fall, initial encounter  Eyebrow laceration, left, initial encounter  Contusion of face, initial encounter  Subdural hematoma without coma, without loss of consciousness, initial encounter [facility])    ED Discharge Orders    None       Blanchie Dessert, MD 12/23/18 2238

## 2018-12-24 DIAGNOSIS — M150 Primary generalized (osteo)arthritis: Secondary | ICD-10-CM | POA: Diagnosis not present

## 2018-12-24 DIAGNOSIS — R2681 Unsteadiness on feet: Secondary | ICD-10-CM | POA: Diagnosis not present

## 2018-12-24 DIAGNOSIS — M1990 Unspecified osteoarthritis, unspecified site: Secondary | ICD-10-CM | POA: Diagnosis not present

## 2018-12-24 DIAGNOSIS — M6281 Muscle weakness (generalized): Secondary | ICD-10-CM | POA: Diagnosis not present

## 2018-12-24 DIAGNOSIS — R41841 Cognitive communication deficit: Secondary | ICD-10-CM | POA: Diagnosis not present

## 2018-12-25 DIAGNOSIS — S065X9A Traumatic subdural hemorrhage with loss of consciousness of unspecified duration, initial encounter: Secondary | ICD-10-CM | POA: Diagnosis not present

## 2018-12-25 DIAGNOSIS — S0512XA Contusion of eyeball and orbital tissues, left eye, initial encounter: Secondary | ICD-10-CM | POA: Diagnosis not present

## 2018-12-26 DIAGNOSIS — M150 Primary generalized (osteo)arthritis: Secondary | ICD-10-CM | POA: Diagnosis not present

## 2018-12-26 DIAGNOSIS — M6281 Muscle weakness (generalized): Secondary | ICD-10-CM | POA: Diagnosis not present

## 2018-12-26 DIAGNOSIS — R41841 Cognitive communication deficit: Secondary | ICD-10-CM | POA: Diagnosis not present

## 2018-12-26 DIAGNOSIS — M1990 Unspecified osteoarthritis, unspecified site: Secondary | ICD-10-CM | POA: Diagnosis not present

## 2018-12-26 DIAGNOSIS — S065X9A Traumatic subdural hemorrhage with loss of consciousness of unspecified duration, initial encounter: Secondary | ICD-10-CM | POA: Diagnosis not present

## 2018-12-26 DIAGNOSIS — R2681 Unsteadiness on feet: Secondary | ICD-10-CM | POA: Diagnosis not present

## 2018-12-27 ENCOUNTER — Encounter (HOSPITAL_BASED_OUTPATIENT_CLINIC_OR_DEPARTMENT_OTHER): Payer: Self-pay | Admitting: Emergency Medicine

## 2018-12-28 DIAGNOSIS — M1990 Unspecified osteoarthritis, unspecified site: Secondary | ICD-10-CM | POA: Diagnosis not present

## 2018-12-28 DIAGNOSIS — M150 Primary generalized (osteo)arthritis: Secondary | ICD-10-CM | POA: Diagnosis not present

## 2018-12-28 DIAGNOSIS — M6281 Muscle weakness (generalized): Secondary | ICD-10-CM | POA: Diagnosis not present

## 2018-12-28 DIAGNOSIS — R41841 Cognitive communication deficit: Secondary | ICD-10-CM | POA: Diagnosis not present

## 2018-12-28 DIAGNOSIS — R2681 Unsteadiness on feet: Secondary | ICD-10-CM | POA: Diagnosis not present

## 2018-12-31 DIAGNOSIS — M150 Primary generalized (osteo)arthritis: Secondary | ICD-10-CM | POA: Diagnosis not present

## 2018-12-31 DIAGNOSIS — R2681 Unsteadiness on feet: Secondary | ICD-10-CM | POA: Diagnosis not present

## 2018-12-31 DIAGNOSIS — M6281 Muscle weakness (generalized): Secondary | ICD-10-CM | POA: Diagnosis not present

## 2018-12-31 DIAGNOSIS — M1990 Unspecified osteoarthritis, unspecified site: Secondary | ICD-10-CM | POA: Diagnosis not present

## 2018-12-31 DIAGNOSIS — R41841 Cognitive communication deficit: Secondary | ICD-10-CM | POA: Diagnosis not present

## 2019-01-01 DIAGNOSIS — M6281 Muscle weakness (generalized): Secondary | ICD-10-CM | POA: Diagnosis not present

## 2019-01-01 DIAGNOSIS — M1990 Unspecified osteoarthritis, unspecified site: Secondary | ICD-10-CM | POA: Diagnosis not present

## 2019-01-01 DIAGNOSIS — R41841 Cognitive communication deficit: Secondary | ICD-10-CM | POA: Diagnosis not present

## 2019-01-01 DIAGNOSIS — M150 Primary generalized (osteo)arthritis: Secondary | ICD-10-CM | POA: Diagnosis not present

## 2019-01-01 DIAGNOSIS — R2681 Unsteadiness on feet: Secondary | ICD-10-CM | POA: Diagnosis not present

## 2019-01-02 DIAGNOSIS — R41841 Cognitive communication deficit: Secondary | ICD-10-CM | POA: Diagnosis not present

## 2019-01-02 DIAGNOSIS — R2681 Unsteadiness on feet: Secondary | ICD-10-CM | POA: Diagnosis not present

## 2019-01-02 DIAGNOSIS — M1990 Unspecified osteoarthritis, unspecified site: Secondary | ICD-10-CM | POA: Diagnosis not present

## 2019-01-02 DIAGNOSIS — M6281 Muscle weakness (generalized): Secondary | ICD-10-CM | POA: Diagnosis not present

## 2019-01-02 DIAGNOSIS — M150 Primary generalized (osteo)arthritis: Secondary | ICD-10-CM | POA: Diagnosis not present

## 2019-01-03 DIAGNOSIS — S065X0A Traumatic subdural hemorrhage without loss of consciousness, initial encounter: Secondary | ICD-10-CM | POA: Diagnosis not present

## 2019-01-04 DIAGNOSIS — M6281 Muscle weakness (generalized): Secondary | ICD-10-CM | POA: Diagnosis not present

## 2019-01-04 DIAGNOSIS — M150 Primary generalized (osteo)arthritis: Secondary | ICD-10-CM | POA: Diagnosis not present

## 2019-01-04 DIAGNOSIS — R41841 Cognitive communication deficit: Secondary | ICD-10-CM | POA: Diagnosis not present

## 2019-01-04 DIAGNOSIS — R2681 Unsteadiness on feet: Secondary | ICD-10-CM | POA: Diagnosis not present

## 2019-01-04 DIAGNOSIS — M1990 Unspecified osteoarthritis, unspecified site: Secondary | ICD-10-CM | POA: Diagnosis not present

## 2019-01-07 DIAGNOSIS — M1990 Unspecified osteoarthritis, unspecified site: Secondary | ICD-10-CM | POA: Diagnosis not present

## 2019-01-07 DIAGNOSIS — M150 Primary generalized (osteo)arthritis: Secondary | ICD-10-CM | POA: Diagnosis not present

## 2019-01-07 DIAGNOSIS — R2681 Unsteadiness on feet: Secondary | ICD-10-CM | POA: Diagnosis not present

## 2019-01-07 DIAGNOSIS — R41841 Cognitive communication deficit: Secondary | ICD-10-CM | POA: Diagnosis not present

## 2019-01-07 DIAGNOSIS — M6281 Muscle weakness (generalized): Secondary | ICD-10-CM | POA: Diagnosis not present

## 2019-01-08 DIAGNOSIS — R41841 Cognitive communication deficit: Secondary | ICD-10-CM | POA: Diagnosis not present

## 2019-01-08 DIAGNOSIS — R2681 Unsteadiness on feet: Secondary | ICD-10-CM | POA: Diagnosis not present

## 2019-01-08 DIAGNOSIS — M150 Primary generalized (osteo)arthritis: Secondary | ICD-10-CM | POA: Diagnosis not present

## 2019-01-08 DIAGNOSIS — M6281 Muscle weakness (generalized): Secondary | ICD-10-CM | POA: Diagnosis not present

## 2019-01-08 DIAGNOSIS — M1990 Unspecified osteoarthritis, unspecified site: Secondary | ICD-10-CM | POA: Diagnosis not present

## 2019-01-09 DIAGNOSIS — M6281 Muscle weakness (generalized): Secondary | ICD-10-CM | POA: Diagnosis not present

## 2019-01-09 DIAGNOSIS — R41841 Cognitive communication deficit: Secondary | ICD-10-CM | POA: Diagnosis not present

## 2019-01-09 DIAGNOSIS — M150 Primary generalized (osteo)arthritis: Secondary | ICD-10-CM | POA: Diagnosis not present

## 2019-01-09 DIAGNOSIS — M1990 Unspecified osteoarthritis, unspecified site: Secondary | ICD-10-CM | POA: Diagnosis not present

## 2019-01-09 DIAGNOSIS — R2681 Unsteadiness on feet: Secondary | ICD-10-CM | POA: Diagnosis not present

## 2019-01-10 DIAGNOSIS — M1990 Unspecified osteoarthritis, unspecified site: Secondary | ICD-10-CM | POA: Diagnosis not present

## 2019-01-10 DIAGNOSIS — M6281 Muscle weakness (generalized): Secondary | ICD-10-CM | POA: Diagnosis not present

## 2019-01-10 DIAGNOSIS — R41841 Cognitive communication deficit: Secondary | ICD-10-CM | POA: Diagnosis not present

## 2019-01-10 DIAGNOSIS — R2681 Unsteadiness on feet: Secondary | ICD-10-CM | POA: Diagnosis not present

## 2019-01-10 DIAGNOSIS — M150 Primary generalized (osteo)arthritis: Secondary | ICD-10-CM | POA: Diagnosis not present

## 2019-01-11 DIAGNOSIS — M6281 Muscle weakness (generalized): Secondary | ICD-10-CM | POA: Diagnosis not present

## 2019-01-11 DIAGNOSIS — S065X9A Traumatic subdural hemorrhage with loss of consciousness of unspecified duration, initial encounter: Secondary | ICD-10-CM | POA: Diagnosis not present

## 2019-01-11 DIAGNOSIS — Z7189 Other specified counseling: Secondary | ICD-10-CM | POA: Diagnosis not present

## 2019-01-11 DIAGNOSIS — I4821 Permanent atrial fibrillation: Secondary | ICD-10-CM | POA: Diagnosis not present

## 2019-01-11 DIAGNOSIS — R2681 Unsteadiness on feet: Secondary | ICD-10-CM | POA: Diagnosis not present

## 2019-01-11 DIAGNOSIS — M150 Primary generalized (osteo)arthritis: Secondary | ICD-10-CM | POA: Diagnosis not present

## 2019-01-11 DIAGNOSIS — M1990 Unspecified osteoarthritis, unspecified site: Secondary | ICD-10-CM | POA: Diagnosis not present

## 2019-01-11 DIAGNOSIS — R41841 Cognitive communication deficit: Secondary | ICD-10-CM | POA: Diagnosis not present

## 2019-01-14 DIAGNOSIS — R41841 Cognitive communication deficit: Secondary | ICD-10-CM | POA: Diagnosis not present

## 2019-01-14 DIAGNOSIS — R2681 Unsteadiness on feet: Secondary | ICD-10-CM | POA: Diagnosis not present

## 2019-01-14 DIAGNOSIS — M150 Primary generalized (osteo)arthritis: Secondary | ICD-10-CM | POA: Diagnosis not present

## 2019-01-14 DIAGNOSIS — M1990 Unspecified osteoarthritis, unspecified site: Secondary | ICD-10-CM | POA: Diagnosis not present

## 2019-01-14 DIAGNOSIS — M6281 Muscle weakness (generalized): Secondary | ICD-10-CM | POA: Diagnosis not present

## 2019-01-15 DIAGNOSIS — R41841 Cognitive communication deficit: Secondary | ICD-10-CM | POA: Diagnosis not present

## 2019-01-15 DIAGNOSIS — M6281 Muscle weakness (generalized): Secondary | ICD-10-CM | POA: Diagnosis not present

## 2019-01-15 DIAGNOSIS — M150 Primary generalized (osteo)arthritis: Secondary | ICD-10-CM | POA: Diagnosis not present

## 2019-01-15 DIAGNOSIS — M1990 Unspecified osteoarthritis, unspecified site: Secondary | ICD-10-CM | POA: Diagnosis not present

## 2019-01-15 DIAGNOSIS — R2681 Unsteadiness on feet: Secondary | ICD-10-CM | POA: Diagnosis not present

## 2019-01-16 DIAGNOSIS — M6281 Muscle weakness (generalized): Secondary | ICD-10-CM | POA: Diagnosis not present

## 2019-01-16 DIAGNOSIS — R41841 Cognitive communication deficit: Secondary | ICD-10-CM | POA: Diagnosis not present

## 2019-01-16 DIAGNOSIS — R2681 Unsteadiness on feet: Secondary | ICD-10-CM | POA: Diagnosis not present

## 2019-01-16 DIAGNOSIS — M1990 Unspecified osteoarthritis, unspecified site: Secondary | ICD-10-CM | POA: Diagnosis not present

## 2019-01-16 DIAGNOSIS — M150 Primary generalized (osteo)arthritis: Secondary | ICD-10-CM | POA: Diagnosis not present

## 2019-01-17 DIAGNOSIS — R41841 Cognitive communication deficit: Secondary | ICD-10-CM | POA: Diagnosis not present

## 2019-01-17 DIAGNOSIS — R2681 Unsteadiness on feet: Secondary | ICD-10-CM | POA: Diagnosis not present

## 2019-01-17 DIAGNOSIS — M1990 Unspecified osteoarthritis, unspecified site: Secondary | ICD-10-CM | POA: Diagnosis not present

## 2019-01-17 DIAGNOSIS — M150 Primary generalized (osteo)arthritis: Secondary | ICD-10-CM | POA: Diagnosis not present

## 2019-01-17 DIAGNOSIS — M6281 Muscle weakness (generalized): Secondary | ICD-10-CM | POA: Diagnosis not present

## 2019-01-18 DIAGNOSIS — R41841 Cognitive communication deficit: Secondary | ICD-10-CM | POA: Diagnosis not present

## 2019-01-18 DIAGNOSIS — M1990 Unspecified osteoarthritis, unspecified site: Secondary | ICD-10-CM | POA: Diagnosis not present

## 2019-01-18 DIAGNOSIS — M6281 Muscle weakness (generalized): Secondary | ICD-10-CM | POA: Diagnosis not present

## 2019-01-18 DIAGNOSIS — M150 Primary generalized (osteo)arthritis: Secondary | ICD-10-CM | POA: Diagnosis not present

## 2019-01-18 DIAGNOSIS — R2681 Unsteadiness on feet: Secondary | ICD-10-CM | POA: Diagnosis not present

## 2019-01-21 DIAGNOSIS — R2681 Unsteadiness on feet: Secondary | ICD-10-CM | POA: Diagnosis not present

## 2019-01-21 DIAGNOSIS — M6281 Muscle weakness (generalized): Secondary | ICD-10-CM | POA: Diagnosis not present

## 2019-01-21 DIAGNOSIS — M150 Primary generalized (osteo)arthritis: Secondary | ICD-10-CM | POA: Diagnosis not present

## 2019-01-21 DIAGNOSIS — M1990 Unspecified osteoarthritis, unspecified site: Secondary | ICD-10-CM | POA: Diagnosis not present

## 2019-01-21 DIAGNOSIS — R41841 Cognitive communication deficit: Secondary | ICD-10-CM | POA: Diagnosis not present

## 2019-01-23 DIAGNOSIS — R41841 Cognitive communication deficit: Secondary | ICD-10-CM | POA: Diagnosis not present

## 2019-01-23 DIAGNOSIS — R2681 Unsteadiness on feet: Secondary | ICD-10-CM | POA: Diagnosis not present

## 2019-01-23 DIAGNOSIS — M1990 Unspecified osteoarthritis, unspecified site: Secondary | ICD-10-CM | POA: Diagnosis not present

## 2019-01-23 DIAGNOSIS — M150 Primary generalized (osteo)arthritis: Secondary | ICD-10-CM | POA: Diagnosis not present

## 2019-01-23 DIAGNOSIS — M6281 Muscle weakness (generalized): Secondary | ICD-10-CM | POA: Diagnosis not present

## 2019-01-24 DIAGNOSIS — M1990 Unspecified osteoarthritis, unspecified site: Secondary | ICD-10-CM | POA: Diagnosis not present

## 2019-01-24 DIAGNOSIS — R41841 Cognitive communication deficit: Secondary | ICD-10-CM | POA: Diagnosis not present

## 2019-01-24 DIAGNOSIS — M6281 Muscle weakness (generalized): Secondary | ICD-10-CM | POA: Diagnosis not present

## 2019-01-24 DIAGNOSIS — M150 Primary generalized (osteo)arthritis: Secondary | ICD-10-CM | POA: Diagnosis not present

## 2019-01-24 DIAGNOSIS — R2681 Unsteadiness on feet: Secondary | ICD-10-CM | POA: Diagnosis not present

## 2019-01-25 DIAGNOSIS — R41841 Cognitive communication deficit: Secondary | ICD-10-CM | POA: Diagnosis not present

## 2019-01-25 DIAGNOSIS — R2681 Unsteadiness on feet: Secondary | ICD-10-CM | POA: Diagnosis not present

## 2019-01-25 DIAGNOSIS — M150 Primary generalized (osteo)arthritis: Secondary | ICD-10-CM | POA: Diagnosis not present

## 2019-01-25 DIAGNOSIS — M6281 Muscle weakness (generalized): Secondary | ICD-10-CM | POA: Diagnosis not present

## 2019-01-25 DIAGNOSIS — M1990 Unspecified osteoarthritis, unspecified site: Secondary | ICD-10-CM | POA: Diagnosis not present

## 2019-01-28 DIAGNOSIS — M1990 Unspecified osteoarthritis, unspecified site: Secondary | ICD-10-CM | POA: Diagnosis not present

## 2019-01-28 DIAGNOSIS — M150 Primary generalized (osteo)arthritis: Secondary | ICD-10-CM | POA: Diagnosis not present

## 2019-01-28 DIAGNOSIS — R41841 Cognitive communication deficit: Secondary | ICD-10-CM | POA: Diagnosis not present

## 2019-01-28 DIAGNOSIS — M6281 Muscle weakness (generalized): Secondary | ICD-10-CM | POA: Diagnosis not present

## 2019-01-28 DIAGNOSIS — R2681 Unsteadiness on feet: Secondary | ICD-10-CM | POA: Diagnosis not present

## 2019-01-30 DIAGNOSIS — M6281 Muscle weakness (generalized): Secondary | ICD-10-CM | POA: Diagnosis not present

## 2019-01-30 DIAGNOSIS — R41841 Cognitive communication deficit: Secondary | ICD-10-CM | POA: Diagnosis not present

## 2019-01-30 DIAGNOSIS — M150 Primary generalized (osteo)arthritis: Secondary | ICD-10-CM | POA: Diagnosis not present

## 2019-01-30 DIAGNOSIS — M1990 Unspecified osteoarthritis, unspecified site: Secondary | ICD-10-CM | POA: Diagnosis not present

## 2019-01-30 DIAGNOSIS — R2681 Unsteadiness on feet: Secondary | ICD-10-CM | POA: Diagnosis not present

## 2019-01-31 DIAGNOSIS — M1990 Unspecified osteoarthritis, unspecified site: Secondary | ICD-10-CM | POA: Diagnosis not present

## 2019-01-31 DIAGNOSIS — M150 Primary generalized (osteo)arthritis: Secondary | ICD-10-CM | POA: Diagnosis not present

## 2019-01-31 DIAGNOSIS — R41841 Cognitive communication deficit: Secondary | ICD-10-CM | POA: Diagnosis not present

## 2019-01-31 DIAGNOSIS — R2681 Unsteadiness on feet: Secondary | ICD-10-CM | POA: Diagnosis not present

## 2019-01-31 DIAGNOSIS — M6281 Muscle weakness (generalized): Secondary | ICD-10-CM | POA: Diagnosis not present

## 2019-02-01 DIAGNOSIS — M6281 Muscle weakness (generalized): Secondary | ICD-10-CM | POA: Diagnosis not present

## 2019-02-01 DIAGNOSIS — R2681 Unsteadiness on feet: Secondary | ICD-10-CM | POA: Diagnosis not present

## 2019-02-01 DIAGNOSIS — R41841 Cognitive communication deficit: Secondary | ICD-10-CM | POA: Diagnosis not present

## 2019-02-01 DIAGNOSIS — M150 Primary generalized (osteo)arthritis: Secondary | ICD-10-CM | POA: Diagnosis not present

## 2019-02-01 DIAGNOSIS — M1990 Unspecified osteoarthritis, unspecified site: Secondary | ICD-10-CM | POA: Diagnosis not present

## 2019-02-04 DIAGNOSIS — R41841 Cognitive communication deficit: Secondary | ICD-10-CM | POA: Diagnosis not present

## 2019-02-04 DIAGNOSIS — M6281 Muscle weakness (generalized): Secondary | ICD-10-CM | POA: Diagnosis not present

## 2019-02-04 DIAGNOSIS — M150 Primary generalized (osteo)arthritis: Secondary | ICD-10-CM | POA: Diagnosis not present

## 2019-02-04 DIAGNOSIS — R2681 Unsteadiness on feet: Secondary | ICD-10-CM | POA: Diagnosis not present

## 2019-02-04 DIAGNOSIS — M1990 Unspecified osteoarthritis, unspecified site: Secondary | ICD-10-CM | POA: Diagnosis not present

## 2019-02-08 DIAGNOSIS — H353131 Nonexudative age-related macular degeneration, bilateral, early dry stage: Secondary | ICD-10-CM | POA: Diagnosis not present

## 2019-02-08 DIAGNOSIS — R41841 Cognitive communication deficit: Secondary | ICD-10-CM | POA: Diagnosis not present

## 2019-02-08 DIAGNOSIS — M6281 Muscle weakness (generalized): Secondary | ICD-10-CM | POA: Diagnosis not present

## 2019-02-08 DIAGNOSIS — M150 Primary generalized (osteo)arthritis: Secondary | ICD-10-CM | POA: Diagnosis not present

## 2019-02-08 DIAGNOSIS — M1990 Unspecified osteoarthritis, unspecified site: Secondary | ICD-10-CM | POA: Diagnosis not present

## 2019-02-08 DIAGNOSIS — R2681 Unsteadiness on feet: Secondary | ICD-10-CM | POA: Diagnosis not present

## 2019-05-16 DIAGNOSIS — E785 Hyperlipidemia, unspecified: Secondary | ICD-10-CM | POA: Diagnosis not present

## 2019-05-16 DIAGNOSIS — F0390 Unspecified dementia without behavioral disturbance: Secondary | ICD-10-CM | POA: Diagnosis not present

## 2019-05-16 LAB — HEPATIC FUNCTION PANEL
ALT: 15 (ref 7–35)
AST: 19 (ref 13–35)
Alkaline Phosphatase: 86 (ref 25–125)
Bilirubin, Total: 0.6

## 2019-05-16 LAB — COMPREHENSIVE METABOLIC PANEL
Albumin: 3.8 (ref 3.5–5.0)
Calcium: 9.4 (ref 8.7–10.7)
GFR calc Af Amer: 56
GFR calc non Af Amer: 48
Globulin: 2.2

## 2019-05-16 LAB — BASIC METABOLIC PANEL
BUN: 29 — AB (ref 4–21)
CO2: 31 — AB (ref 13–22)
Chloride: 102 (ref 99–108)
Creatinine: 1 (ref 0.5–1.1)
Glucose: 92
Potassium: 4.1 (ref 3.4–5.3)
Sodium: 140 (ref 137–147)

## 2019-05-16 LAB — LIPID PANEL
Cholesterol: 159 (ref 0–200)
HDL: 68 (ref 35–70)
LDL Cholesterol: 79
Triglycerides: 50 (ref 40–160)

## 2019-05-23 DIAGNOSIS — S065X9A Traumatic subdural hemorrhage with loss of consciousness of unspecified duration, initial encounter: Secondary | ICD-10-CM | POA: Diagnosis not present

## 2019-05-23 DIAGNOSIS — E782 Mixed hyperlipidemia: Secondary | ICD-10-CM | POA: Diagnosis not present

## 2019-05-23 DIAGNOSIS — I4821 Permanent atrial fibrillation: Secondary | ICD-10-CM | POA: Diagnosis not present

## 2019-05-23 DIAGNOSIS — M159 Polyosteoarthritis, unspecified: Secondary | ICD-10-CM | POA: Diagnosis not present

## 2019-05-23 DIAGNOSIS — I10 Essential (primary) hypertension: Secondary | ICD-10-CM | POA: Diagnosis not present

## 2019-05-23 DIAGNOSIS — F0390 Unspecified dementia without behavioral disturbance: Secondary | ICD-10-CM | POA: Diagnosis not present

## 2019-05-28 DIAGNOSIS — Z20828 Contact with and (suspected) exposure to other viral communicable diseases: Secondary | ICD-10-CM | POA: Diagnosis not present

## 2019-06-03 DIAGNOSIS — Z20828 Contact with and (suspected) exposure to other viral communicable diseases: Secondary | ICD-10-CM | POA: Diagnosis not present

## 2019-06-11 DIAGNOSIS — Z20828 Contact with and (suspected) exposure to other viral communicable diseases: Secondary | ICD-10-CM | POA: Diagnosis not present

## 2019-06-17 DIAGNOSIS — Z23 Encounter for immunization: Secondary | ICD-10-CM | POA: Diagnosis not present

## 2019-07-15 DIAGNOSIS — Z23 Encounter for immunization: Secondary | ICD-10-CM | POA: Diagnosis not present

## 2019-09-05 ENCOUNTER — Encounter: Payer: Self-pay | Admitting: Internal Medicine

## 2019-09-05 NOTE — Progress Notes (Signed)
Error

## 2019-10-16 DIAGNOSIS — I10 Essential (primary) hypertension: Secondary | ICD-10-CM | POA: Diagnosis not present

## 2019-10-16 DIAGNOSIS — R600 Localized edema: Secondary | ICD-10-CM | POA: Diagnosis not present

## 2019-10-16 DIAGNOSIS — I872 Venous insufficiency (chronic) (peripheral): Secondary | ICD-10-CM | POA: Diagnosis not present

## 2019-10-16 DIAGNOSIS — E782 Mixed hyperlipidemia: Secondary | ICD-10-CM | POA: Diagnosis not present

## 2019-10-16 DIAGNOSIS — I4821 Permanent atrial fibrillation: Secondary | ICD-10-CM | POA: Diagnosis not present

## 2019-10-16 DIAGNOSIS — F0390 Unspecified dementia without behavioral disturbance: Secondary | ICD-10-CM | POA: Diagnosis not present

## 2019-10-16 DIAGNOSIS — M159 Polyosteoarthritis, unspecified: Secondary | ICD-10-CM | POA: Diagnosis not present

## 2019-10-21 ENCOUNTER — Other Ambulatory Visit (HOSPITAL_COMMUNITY): Payer: Self-pay | Admitting: Internal Medicine

## 2019-10-21 DIAGNOSIS — R600 Localized edema: Secondary | ICD-10-CM

## 2019-10-24 ENCOUNTER — Other Ambulatory Visit: Payer: Self-pay

## 2019-10-24 ENCOUNTER — Ambulatory Visit (HOSPITAL_COMMUNITY)
Admission: RE | Admit: 2019-10-24 | Discharge: 2019-10-24 | Disposition: A | Discharge status: Home / Self Care | Payer: Medicare Other | Source: Ambulatory Visit | Attending: Internal Medicine | Admitting: Internal Medicine

## 2019-10-24 DIAGNOSIS — R600 Localized edema: Secondary | ICD-10-CM | POA: Diagnosis not present

## 2019-10-24 NOTE — Progress Notes (Signed)
  Echocardiogram 2D Echocardiogram has been performed.  Delcie Roch 10/24/2019, 1:58 PM

## 2019-10-30 DIAGNOSIS — E782 Mixed hyperlipidemia: Secondary | ICD-10-CM | POA: Diagnosis not present

## 2019-10-30 DIAGNOSIS — R609 Edema, unspecified: Secondary | ICD-10-CM | POA: Diagnosis not present

## 2019-10-30 DIAGNOSIS — I10 Essential (primary) hypertension: Secondary | ICD-10-CM | POA: Diagnosis not present

## 2019-10-30 DIAGNOSIS — I482 Chronic atrial fibrillation, unspecified: Secondary | ICD-10-CM | POA: Diagnosis not present

## 2019-11-12 ENCOUNTER — Inpatient Hospital Stay (HOSPITAL_COMMUNITY)
Admission: EM | Admit: 2019-11-12 | Discharge: 2019-11-16 | DRG: 065 | Disposition: A | Discharge status: Skilled Nursing Facility | Payer: Medicare Other | Source: Skilled Nursing Facility | Attending: Internal Medicine | Admitting: Internal Medicine

## 2019-11-12 ENCOUNTER — Encounter (HOSPITAL_COMMUNITY): Payer: Self-pay | Admitting: Emergency Medicine

## 2019-11-12 DIAGNOSIS — G319 Degenerative disease of nervous system, unspecified: Secondary | ICD-10-CM | POA: Diagnosis present

## 2019-11-12 DIAGNOSIS — R233 Spontaneous ecchymoses: Secondary | ICD-10-CM | POA: Diagnosis present

## 2019-11-12 DIAGNOSIS — I10 Essential (primary) hypertension: Secondary | ICD-10-CM | POA: Diagnosis present

## 2019-11-12 DIAGNOSIS — R414 Neurologic neglect syndrome: Secondary | ICD-10-CM | POA: Diagnosis not present

## 2019-11-12 DIAGNOSIS — R29818 Other symptoms and signs involving the nervous system: Secondary | ICD-10-CM | POA: Diagnosis present

## 2019-11-12 DIAGNOSIS — G459 Transient cerebral ischemic attack, unspecified: Secondary | ICD-10-CM | POA: Diagnosis not present

## 2019-11-12 DIAGNOSIS — Z20822 Contact with and (suspected) exposure to covid-19: Secondary | ICD-10-CM | POA: Diagnosis not present

## 2019-11-12 DIAGNOSIS — F0280 Dementia in other diseases classified elsewhere without behavioral disturbance: Secondary | ICD-10-CM | POA: Diagnosis not present

## 2019-11-12 DIAGNOSIS — Z6829 Body mass index (BMI) 29.0-29.9, adult: Secondary | ICD-10-CM

## 2019-11-12 DIAGNOSIS — Z79899 Other long term (current) drug therapy: Secondary | ICD-10-CM

## 2019-11-12 DIAGNOSIS — R531 Weakness: Secondary | ICD-10-CM | POA: Diagnosis not present

## 2019-11-12 DIAGNOSIS — Z66 Do not resuscitate: Secondary | ICD-10-CM | POA: Diagnosis present

## 2019-11-12 DIAGNOSIS — E663 Overweight: Secondary | ICD-10-CM | POA: Diagnosis present

## 2019-11-12 DIAGNOSIS — I480 Paroxysmal atrial fibrillation: Secondary | ICD-10-CM | POA: Diagnosis present

## 2019-11-12 DIAGNOSIS — N1831 Chronic kidney disease, stage 3a: Secondary | ICD-10-CM | POA: Diagnosis present

## 2019-11-12 DIAGNOSIS — Z9181 History of falling: Secondary | ICD-10-CM

## 2019-11-12 DIAGNOSIS — I63431 Cerebral infarction due to embolism of right posterior cerebral artery: Principal | ICD-10-CM | POA: Diagnosis present

## 2019-11-12 DIAGNOSIS — I129 Hypertensive chronic kidney disease with stage 1 through stage 4 chronic kidney disease, or unspecified chronic kidney disease: Secondary | ICD-10-CM | POA: Diagnosis present

## 2019-11-12 DIAGNOSIS — H53462 Homonymous bilateral field defects, left side: Secondary | ICD-10-CM | POA: Diagnosis present

## 2019-11-12 DIAGNOSIS — R29810 Facial weakness: Secondary | ICD-10-CM | POA: Diagnosis not present

## 2019-11-12 DIAGNOSIS — R4781 Slurred speech: Secondary | ICD-10-CM | POA: Diagnosis not present

## 2019-11-12 DIAGNOSIS — I4891 Unspecified atrial fibrillation: Secondary | ICD-10-CM | POA: Diagnosis present

## 2019-11-12 DIAGNOSIS — D649 Anemia, unspecified: Secondary | ICD-10-CM | POA: Diagnosis present

## 2019-11-12 DIAGNOSIS — R944 Abnormal results of kidney function studies: Secondary | ICD-10-CM | POA: Diagnosis present

## 2019-11-12 DIAGNOSIS — G309 Alzheimer's disease, unspecified: Secondary | ICD-10-CM | POA: Diagnosis present

## 2019-11-12 DIAGNOSIS — R471 Dysarthria and anarthria: Secondary | ICD-10-CM | POA: Diagnosis present

## 2019-11-12 DIAGNOSIS — Z91030 Bee allergy status: Secondary | ICD-10-CM

## 2019-11-12 DIAGNOSIS — I639 Cerebral infarction, unspecified: Secondary | ICD-10-CM | POA: Diagnosis present

## 2019-11-12 DIAGNOSIS — Z7982 Long term (current) use of aspirin: Secondary | ICD-10-CM

## 2019-11-12 DIAGNOSIS — N183 Chronic kidney disease, stage 3 unspecified: Secondary | ICD-10-CM

## 2019-11-12 LAB — BASIC METABOLIC PANEL
Anion gap: 12 (ref 5–15)
BUN: 27 mg/dL — ABNORMAL HIGH (ref 8–23)
CO2: 27 mmol/L (ref 22–32)
Calcium: 9.3 mg/dL (ref 8.9–10.3)
Chloride: 101 mmol/L (ref 98–111)
Creatinine, Ser: 1.18 mg/dL — ABNORMAL HIGH (ref 0.44–1.00)
GFR calc Af Amer: 47 mL/min — ABNORMAL LOW (ref >60)
GFR calc non Af Amer: 41 mL/min — ABNORMAL LOW (ref >60)
Glucose, Bld: 104 mg/dL — ABNORMAL HIGH (ref 70–99)
Potassium: 4.4 mmol/L (ref 3.5–5.1)
Sodium: 140 mmol/L (ref 135–145)

## 2019-11-12 LAB — CBC
HCT: 35.5 % — ABNORMAL LOW (ref 36.0–46.0)
Hemoglobin: 11.6 g/dL — ABNORMAL LOW (ref 12.0–15.0)
MCH: 30.4 pg (ref 26.0–34.0)
MCHC: 32.7 g/dL (ref 30.0–36.0)
MCV: 92.9 fL (ref 80.0–100.0)
Platelets: 239 10*3/uL (ref 150–400)
RBC: 3.82 MIL/uL — ABNORMAL LOW (ref 3.87–5.11)
RDW: 14.2 % (ref 11.5–15.5)
WBC: 7.7 10*3/uL (ref 4.0–10.5)
nRBC: 0 % (ref 0.0–0.2)

## 2019-11-12 LAB — URINALYSIS, ROUTINE W REFLEX MICROSCOPIC
Bilirubin Urine: NEGATIVE
Glucose, UA: NEGATIVE mg/dL
Hgb urine dipstick: NEGATIVE
Ketones, ur: NEGATIVE mg/dL
Leukocytes,Ua: NEGATIVE
Nitrite: NEGATIVE
Protein, ur: NEGATIVE mg/dL
Specific Gravity, Urine: 1.013 (ref 1.005–1.030)
pH: 6 (ref 5.0–8.0)

## 2019-11-12 NOTE — ED Notes (Signed)
Pt ambulated with unsteady gait. Walker and x2 person assist was needed

## 2019-11-12 NOTE — ED Triage Notes (Addendum)
Pt BIB [facility] from [facility], [place] 6pm tonight when pt went to dinner. At 9pm a Engineer, materials for the facility noticed pt stayed at the table longer than normal ans reports pt had slurred speech, left sided weakness and difficulty standing. On EMS arrival, pt was back to baseline and ambulatory to her norm with a walker. Hx dementia. EMS VS: BP 170/70, HR 72, RR 20

## 2019-11-13 ENCOUNTER — Encounter (HOSPITAL_COMMUNITY): Payer: Self-pay | Admitting: Family Medicine

## 2019-11-13 ENCOUNTER — Observation Stay (HOSPITAL_COMMUNITY): Payer: Medicare Other

## 2019-11-13 DIAGNOSIS — I639 Cerebral infarction, unspecified: Secondary | ICD-10-CM | POA: Diagnosis not present

## 2019-11-13 DIAGNOSIS — I4891 Unspecified atrial fibrillation: Secondary | ICD-10-CM | POA: Diagnosis present

## 2019-11-13 DIAGNOSIS — D649 Anemia, unspecified: Secondary | ICD-10-CM | POA: Diagnosis present

## 2019-11-13 DIAGNOSIS — Z20822 Contact with and (suspected) exposure to covid-19: Secondary | ICD-10-CM | POA: Diagnosis present

## 2019-11-13 DIAGNOSIS — M6281 Muscle weakness (generalized): Secondary | ICD-10-CM | POA: Diagnosis not present

## 2019-11-13 DIAGNOSIS — R471 Dysarthria and anarthria: Secondary | ICD-10-CM | POA: Diagnosis present

## 2019-11-13 DIAGNOSIS — Z66 Do not resuscitate: Secondary | ICD-10-CM | POA: Diagnosis present

## 2019-11-13 DIAGNOSIS — G319 Degenerative disease of nervous system, unspecified: Secondary | ICD-10-CM | POA: Diagnosis present

## 2019-11-13 DIAGNOSIS — N183 Chronic kidney disease, stage 3 unspecified: Secondary | ICD-10-CM

## 2019-11-13 DIAGNOSIS — I480 Paroxysmal atrial fibrillation: Secondary | ICD-10-CM

## 2019-11-13 DIAGNOSIS — I63431 Cerebral infarction due to embolism of right posterior cerebral artery: Secondary | ICD-10-CM | POA: Diagnosis present

## 2019-11-13 DIAGNOSIS — G459 Transient cerebral ischemic attack, unspecified: Secondary | ICD-10-CM | POA: Diagnosis not present

## 2019-11-13 DIAGNOSIS — R29818 Other symptoms and signs involving the nervous system: Secondary | ICD-10-CM | POA: Diagnosis present

## 2019-11-13 DIAGNOSIS — Z79899 Other long term (current) drug therapy: Secondary | ICD-10-CM | POA: Diagnosis not present

## 2019-11-13 DIAGNOSIS — R233 Spontaneous ecchymoses: Secondary | ICD-10-CM | POA: Diagnosis present

## 2019-11-13 DIAGNOSIS — Z7982 Long term (current) use of aspirin: Secondary | ICD-10-CM | POA: Diagnosis not present

## 2019-11-13 DIAGNOSIS — G309 Alzheimer's disease, unspecified: Secondary | ICD-10-CM | POA: Diagnosis present

## 2019-11-13 DIAGNOSIS — E663 Overweight: Secondary | ICD-10-CM | POA: Diagnosis present

## 2019-11-13 DIAGNOSIS — Z91030 Bee allergy status: Secondary | ICD-10-CM | POA: Diagnosis not present

## 2019-11-13 DIAGNOSIS — Z9181 History of falling: Secondary | ICD-10-CM | POA: Diagnosis not present

## 2019-11-13 DIAGNOSIS — R414 Neurologic neglect syndrome: Secondary | ICD-10-CM | POA: Diagnosis present

## 2019-11-13 DIAGNOSIS — N289 Disorder of kidney and ureter, unspecified: Secondary | ICD-10-CM | POA: Diagnosis not present

## 2019-11-13 DIAGNOSIS — R2681 Unsteadiness on feet: Secondary | ICD-10-CM | POA: Diagnosis not present

## 2019-11-13 DIAGNOSIS — I69318 Other symptoms and signs involving cognitive functions following cerebral infarction: Secondary | ICD-10-CM | POA: Diagnosis not present

## 2019-11-13 DIAGNOSIS — H53462 Homonymous bilateral field defects, left side: Secondary | ICD-10-CM | POA: Diagnosis present

## 2019-11-13 DIAGNOSIS — F0280 Dementia in other diseases classified elsewhere without behavioral disturbance: Secondary | ICD-10-CM | POA: Diagnosis present

## 2019-11-13 DIAGNOSIS — R4789 Other speech disturbances: Secondary | ICD-10-CM | POA: Diagnosis not present

## 2019-11-13 DIAGNOSIS — N1831 Chronic kidney disease, stage 3a: Secondary | ICD-10-CM | POA: Diagnosis present

## 2019-11-13 DIAGNOSIS — I10 Essential (primary) hypertension: Secondary | ICD-10-CM | POA: Diagnosis not present

## 2019-11-13 DIAGNOSIS — G301 Alzheimer's disease with late onset: Secondary | ICD-10-CM | POA: Diagnosis not present

## 2019-11-13 DIAGNOSIS — Z6829 Body mass index (BMI) 29.0-29.9, adult: Secondary | ICD-10-CM | POA: Diagnosis not present

## 2019-11-13 DIAGNOSIS — I129 Hypertensive chronic kidney disease with stage 1 through stage 4 chronic kidney disease, or unspecified chronic kidney disease: Secondary | ICD-10-CM | POA: Diagnosis present

## 2019-11-13 DIAGNOSIS — R944 Abnormal results of kidney function studies: Secondary | ICD-10-CM | POA: Diagnosis present

## 2019-11-13 LAB — RAPID URINE DRUG SCREEN, HOSP PERFORMED
Amphetamines: NOT DETECTED
Barbiturates: NOT DETECTED
Benzodiazepines: NOT DETECTED
Cocaine: NOT DETECTED
Opiates: NOT DETECTED
Tetrahydrocannabinol: NOT DETECTED

## 2019-11-13 LAB — URINE CULTURE: Culture: <10000 — AB

## 2019-11-13 LAB — HEMOGLOBIN A1C
Hgb A1c MFr Bld: 5.3 % (ref 4.8–5.6)
Mean Plasma Glucose: 105.41 mg/dL

## 2019-11-13 LAB — LIPID PANEL
Cholesterol: 141 mg/dL (ref 0–200)
HDL: 64 mg/dL (ref >40)
LDL Cholesterol: 68 mg/dL (ref 0–99)
Total CHOL/HDL Ratio: 2.2 RATIO
Triglycerides: 46 mg/dL (ref <150)
VLDL: 9 mg/dL (ref 0–40)

## 2019-11-13 LAB — SARS CORONAVIRUS 2 (TAT 6-24 HRS): SARS Coronavirus 2: NEGATIVE

## 2019-11-13 LAB — PROTIME-INR
INR: 1.1 (ref 0.8–1.2)
Prothrombin Time: 13.3 seconds (ref 11.4–15.2)

## 2019-11-13 MED ORDER — LORAZEPAM 1 MG PO TABS: 0.5000 mg | ORAL_TABLET | Freq: Once | ORAL | Status: DC | PRN

## 2019-11-13 MED ORDER — ACETAMINOPHEN 325 MG PO TABS
650.0000 mg | ORAL_TABLET | ORAL | Status: DC | PRN
  Administered 2019-11-15: 650 mg via ORAL
  Filled 2019-11-13 (×2): qty 2

## 2019-11-13 MED ORDER — ACETAMINOPHEN 650 MG RE SUPP: 650.0000 mg | RECTAL | Status: DC | PRN

## 2019-11-13 MED ORDER — ACETAMINOPHEN 160 MG/5ML PO SOLN: 650.0000 mg | ORAL | Status: DC | PRN

## 2019-11-13 MED ORDER — LORAZEPAM 1 MG PO TABS
1.0000 mg | ORAL_TABLET | Freq: Once | ORAL | Status: AC | PRN
  Administered 2019-11-13: 1 mg via ORAL
  Filled 2019-11-13: qty 1

## 2019-11-13 MED ORDER — ATORVASTATIN CALCIUM 80 MG PO TABS
80.0000 mg | ORAL_TABLET | Freq: Every day | ORAL | Status: DC
  Filled 2019-11-13: qty 1

## 2019-11-13 MED ORDER — STROKE: EARLY STAGES OF RECOVERY BOOK
Freq: Once | Status: DC
  Filled 2019-11-13: qty 1

## 2019-11-13 MED ORDER — ASPIRIN 325 MG PO TABS
325.0000 mg | ORAL_TABLET | Freq: Every day | ORAL | Status: DC
  Administered 2019-11-14 – 2019-11-16 (×3): 325 mg via ORAL
  Filled 2019-11-13 (×4): qty 1

## 2019-11-13 MED ORDER — CHLORHEXIDINE GLUCONATE 0.12 % MT SOLN
15.0000 mL | Freq: Two times a day (BID) | OROMUCOSAL | Status: DC
  Administered 2019-11-14 – 2019-11-16 (×6): 15 mL via OROMUCOSAL
  Filled 2019-11-13 (×5): qty 15

## 2019-11-13 MED ORDER — SODIUM CHLORIDE 0.9 % IV SOLN: 250.0000 mL | INTRAVENOUS | Status: DC | PRN

## 2019-11-13 MED ORDER — SODIUM CHLORIDE 0.9% FLUSH: 3.0000 mL | INTRAVENOUS | Status: DC | PRN

## 2019-11-13 MED ORDER — ENOXAPARIN SODIUM 40 MG/0.4ML ~~LOC~~ SOLN: 40.0000 mg | SUBCUTANEOUS | Status: DC

## 2019-11-13 MED ORDER — LORAZEPAM 1 MG PO TABS: 0.5000 mg | ORAL_TABLET | Freq: Four times a day (QID) | ORAL | Status: DC | PRN

## 2019-11-13 MED ORDER — ASPIRIN 300 MG RE SUPP: 300.0000 mg | Freq: Every day | RECTAL | Status: DC

## 2019-11-13 MED ORDER — SENNOSIDES-DOCUSATE SODIUM 8.6-50 MG PO TABS
1.0000 | ORAL_TABLET | Freq: Every evening | ORAL | Status: DC | PRN
  Filled 2019-11-13: qty 1

## 2019-11-13 MED ORDER — SODIUM CHLORIDE 0.9% FLUSH
3.0000 mL | Freq: Two times a day (BID) | INTRAVENOUS | Status: DC
  Administered 2019-11-14 – 2019-11-16 (×5): 3 mL via INTRAVENOUS

## 2019-11-13 NOTE — ED Notes (Signed)
Pt found detaching herself from all monitoring equipment, ripped out IV in R forearm. This RN re-attached pt to monitoring equipment, was unable to regain IV access. Will request another RN's assistance

## 2019-11-13 NOTE — ED Notes (Signed)
Neuro MD in with pt and NIH completed.  Pt able to follow commands and participate in exam.  Confused at baseline.

## 2019-11-13 NOTE — ED Notes (Signed)
Pt awake and confused, pulling at cords and IV protective wrapping.  Able to redirect and replace monitor leads, etc.  Pt calm without gross agitation or changes noted.  Able to sit upright without (A) to reposition and tie gown.  Denies any current needs.

## 2019-11-13 NOTE — ED Provider Notes (Signed)
[facility] EMERGENCY DEPARTMENT Provider Note   CSN: [id number] Arrival date & time: 11/12/19  2234     History Chief Complaint  Patient presents with  . Stroke Symptoms   Level 5 caveat due to altered mental status Ever L Casa is a 86 y.o. female.  The history is provided by the patient. The history is limited by the condition of the patient.  Weakness Severity:  Severe Onset quality:  Sudden Timing:  Constant Progression:  Improving Chronicity:  New Relieved by:  Nothing Worsened by:  Nothing Patient with history of dementia, atrial fibrillation, hypertension presents for possible stroke.  Last known well at 6 PM.  At 9 PM patient was noticed to have slurred speech and left-sided weakness.  Patient lives in a nursing facility. Patient is improving.  No other details are known.     Past Medical History:  Diagnosis Date  . Alzheimer disease ([place])   . Atrial fibrillation ([place])   . Hypertension     There are no problems to display for this patient.   History reviewed. No pertinent surgical history.   OB History   No obstetric history on file.     No family history on file.  Social History   Tobacco Use  . Smoking status: Never Smoker  Substance Use Topics  . Alcohol use: Not Currently  . Drug use: Not on file    Home Medications Prior to Admission medications   Medication Sig Start Date End Date Taking? Authorizing Provider  aspirin 325 MG tablet Take 325 mg by mouth daily.    [provider]  bimatoprost (LUMIGAN) 0.01 % SOLN 1 drop at bedtime.    [provider]  Calcium Carb-Cholecalciferol (CALCIUM 600 + D PO) Take 1 tablet by mouth in the morning and at bedtime.    [provider]  donepezil (ARICEPT) 5 MG tablet Take 5 mg by mouth at bedtime.    [provider]  indapamide (LOZOL) 2.5 MG tablet Take 2.5 mg by mouth every morning.    [provider]  Metoprolol Succinate 50 MG CS24  Take 1 tablet by mouth daily.    [provider]  Multiple Vitamins-Minerals (CENTRUM SILVER PO) Take 8 mg by mouth daily. 460mcg-300mcg.    [provider]  triamcinolone cream (KENALOG) 0.1 % Apply 1 application topically 2 (two) times daily as needed.    [provider]    Allergies    Bee venom  Review of Systems   Review of Systems  Unable to perform ROS: Mental status change  Neurological: Positive for weakness.    Physical Exam Updated Vital Signs BP (!) 142/46   Pulse 64   Temp 98.4 F (36.9 C) (Oral)   Resp 14   SpO2 99%   Physical Exam CONSTITUTIONAL: Elderly, frail HEAD: Normocephalic/atraumatic EYES: EOMI/PERRL ENMT: Mucous membranes moist NECK: supple no meningeal signs SPINE/BACK:entire spine nontender CV: S1/S2 noted, no murmurs/rubs/gallops noted LUNGS: Lungs are clear to auscultation bilaterally, no apparent distress ABDOMEN: soft, nontender NEURO: Pt is awake/alert the patient appears confused.  She appears distracted.  There is no obvious facial droop.  No arm or leg drift. ? Neglect on left side no dysarthria or aphasia EXTREMITIES: pulses normal/equal, full ROM, no deformities SKIN: warm, color normal PSYCH: Unable to assess  ED Results / Procedures / Treatments   Labs (all labs ordered are listed, but only abnormal results are displayed) Labs Reviewed  BASIC METABOLIC PANEL - Abnormal; Notable  for the following components:      Result Value   Glucose, Bld 104 (*)    BUN 27 (*)    Creatinine, Ser 1.18 (*)    GFR calc non Af Amer 41 (*)    GFR calc Af Amer 47 (*)    All other components within normal limits  CBC - Abnormal; Notable for the following components:   RBC 3.82 (*)    Hemoglobin 11.6 (*)    HCT 35.5 (*)    All other components within normal limits  URINE CULTURE  SARS CORONAVIRUS 2 (TAT 6-24 HRS)  URINALYSIS, ROUTINE W REFLEX MICROSCOPIC  RAPID URINE DRUG SCREEN, HOSP PERFORMED  PROTIME-INR  CBG  MONITORING, ED    EKG EKG Interpretation  Date/Time:  Tuesday November 12 2019 23:27:17 EDT Ventricular Rate:  74 PR Interval:    QRS Duration: 91 QT Interval:  444 QTC Calculation: 493 R Axis:   82 Text Interpretation: Sinus rhythm Consider left atrial enlargement Borderline right axis deviation Minimal ST depression, diffuse leads Borderline prolonged QT interval No significant change since last tracing Confirmed by Zadie Rhine ([id number]) on 11/13/2019 12:12:48 AM   Radiology No results found.  Procedures Procedures   Medications Ordered in ED Medications  LORazepam (ATIVAN) tablet 0.5 mg (has no administration in time range)    ED Course  I have reviewed the triage vital signs and the nursing notes.  Pertinent labs & imaging results that were available during my care of the patient were reviewed by me and considered in my medical decision making (see chart for details).    MDM Rules/Calculators/A&P                      12:20 AM Patient presents with possible stroke.  At approximate 9 PM patient had been found with slurred speech and difficulty walking and left-sided weakness She now appears to be improving however she may have some neglect on the left side.  Patient also had difficulty ambulating and required 2 person assist.  Patient's baseline is with a walker.  Son is at bedside who confirms her ambulatory status.  Patient is a DNR.  Since patient is having difficulty ambulating she will likely require admission, will have MRI while waiting admission  tPA in stroke considered but not given due to: Onset over 3-4.5hours  Son's number is [phone] 12:30 AM D/w dr opyd for admission He requests neuro consult 12:37 AM D/w Dr Wilford Corner with neuro He requests call back if MRI is positive  Final Clinical Impression(s) / ED Diagnoses Final diagnoses:  TIA (transient ischemic attack)    Rx / DC Orders ED Discharge Orders    None       Zadie Rhine,  MD 11/13/19 [phone]

## 2019-11-13 NOTE — Evaluation (Signed)
Occupational Therapy Evaluation Patient Details Name: Jasmine Woodward MRN: [id number] DOB: 1928/02/11 Today's Date: 11/13/2019    History of Present Illness Jasmine Woodward is an 86 y.o. female from [facility] presenting with L sided weakness. 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 PMH includes Alzheimer dementia, atrial fibrillation (not on anticoagulation) and HTN.   Clinical Impression   PTA pt living at [facility], using a RW, with history of alzheimer's dementia. At time of eval, pt required max A +2 for safe bed mobility and mod A +2 for sit <> stands. No obvious unilateral weakness noted on evaluation. Unsure of pts baseline cognition, but deficits were noted in orientation, awareness, safety, command following, and problem solving. Pt very perseverative with poor sequencing and needing tactile cues for safety. Pt was able to take few small side steps up in bed with assist to shift weight. Given current status recommend SNF level of care. Unsure if facility can provide this, otherwise will need new facility. Will continue to follow per POC listed below to progress pt in BADL engagement and safety.     Follow Up Recommendations  SNF(return to facility if they accept this level of care)    Equipment Recommendations  None recommended by OT    Recommendations for Other Services       Precautions / Restrictions Precautions Precautions: Fall Restrictions Weight Bearing Restrictions: No      Mobility Bed Mobility Overal bed mobility: Needs Assistance Bed Mobility: Supine to Sit;Sit to Supine     Supine to sit: Max assist;+2 for physical assistance;+2 for safety/equipment Sit to supine: Max assist;+2 for physical assistance;+2 for safety/equipment   General bed mobility comments: Pt was able to pull up into long sitting with min A, but required max A +2 to sequence and problem solve transfer to EOB  Transfers Overall  transfer level: Needs assistance Equipment used: 2 person hand held assist Transfers: Sit to/from Stand Sit to Stand: Mod assist;+2 physical assistance;+2 safety/equipment         General transfer comment: increased time and cueing needed for sequencing and to stand from high stretcher. Pt perseverative on pulling up underwear and needing max physical cueing to initiate sitting    Balance Overall balance assessment: Needs assistance Sitting-balance support: Bilateral upper extremity supported;Feet supported Sitting balance-Leahy Scale: Fair     Standing balance support: Bilateral upper extremity supported;During functional activity Standing balance-Leahy Scale: Poor                             ADL either performed or assessed with clinical judgement   ADL Overall ADL's : Needs assistance/impaired Eating/Feeding: NPO   Grooming: Minimal assistance;Sitting;Cueing for sequencing;Cueing for safety   Upper Body Bathing: Moderate assistance;Sitting;Cueing for sequencing;Cueing for safety   Lower Body Bathing: Maximal assistance;Sitting/lateral leans;Sit to/from stand;Cueing for sequencing;Cueing for safety   Upper Body Dressing : Moderate assistance;Sitting;Cueing for sequencing;Cueing for safety   Lower Body Dressing: Maximal assistance;Sitting/lateral leans;Sit to/from stand;Cueing for safety;Cueing for sequencing   Toilet Transfer: Moderate assistance;+2 for physical assistance;+2 for safety/equipment;Stand-pivot;Cueing for safety;Cueing for sequencing Toilet Transfer Details (indicate cue type and reason): difficulty following commands, needing increased cues and time Toileting- Clothing Manipulation and Hygiene: Total assistance Toileting - Clothing Manipulation Details (indicate cue type and reason): total A bed level after becoming incontinent     Functional mobility during ADLs: Maximal assistance;+2 for physical assistance;+2 for safety/equipment;Cueing  for  safety;Cueing for sequencing(side steps up in bed only)       Vision Patient Visual Report: No change from baseline Additional Comments: difficult to assess due to cognition, will continue to monitor in sessions     Perception     Praxis      Pertinent Vitals/Pain Pain Assessment: Faces Faces Pain Scale: No hurt     Hand Dominance     Extremity/Trunk Assessment Upper Extremity Assessment Upper Extremity Assessment: Generalized weakness;Difficult to assess due to impaired cognition(no unilateral weakness obviously identified)   Lower Extremity Assessment Lower Extremity Assessment: Generalized weakness;Difficult to assess due to impaired cognition       Communication Communication Communication: Expressive difficulties;Receptive difficulties   Cognition Arousal/Alertness: Awake/alert Behavior During Therapy: Restless Overall Cognitive Status: No family/caregiver present to determine baseline cognitive functioning(Unsure of alzheimer's baseline) Area of Impairment: Orientation;Attention;Memory;Following commands;Safety/judgement;Awareness;Problem solving                 Orientation Level: Disoriented to;Place;Time;Situation Current Attention Level: Focused Memory: Decreased recall of precautions;Decreased short-term memory Following Commands: Follows one step commands inconsistently Safety/Judgement: Decreased awareness of safety;Decreased awareness of deficits Awareness: Intellectual Problem Solving: Slow processing;Decreased initiation;Difficulty sequencing;Requires verbal cues;Requires tactile cues General Comments: Pt very perseverative and with poor sequencing throughout session   General Comments       Exercises     Shoulder Instructions      Home Living Family/patient expects to be discharged to:: Skilled nursing facility                                 Additional Comments: Pt from [facility]      Prior  Functioning/Environment          Comments: pt not able to provide report. She does say she uses a RW. Assume she has assist for BADLs        OT Problem List: Decreased strength;Decreased knowledge of use of DME or AE;Decreased knowledge of precautions;Decreased activity tolerance;Decreased cognition;Impaired balance (sitting and/or standing);Decreased safety awareness      OT Treatment/Interventions: Self-care/ADL training;Therapeutic exercise;Patient/family education;Balance training;Neuromuscular education;Energy conservation;Therapeutic activities;DME and/or AE instruction;Cognitive remediation/compensation    OT Goals(Current goals can be found in the care plan section) Acute Rehab OT Goals OT Goal Formulation: Patient unable to participate in goal setting Time For Goal Achievement: 11/27/19 Potential to Achieve Goals: Fair  OT Frequency: Min 2X/week   Barriers to D/C:            Co-evaluation PT/OT/SLP Co-Evaluation/Treatment: Yes Reason for Co-Treatment: Complexity of the patient's impairments (multi-system involvement);Necessary to address cognition/behavior during functional activity   OT goals addressed during session: ADL's and self-care;Proper use of Adaptive equipment and DME      AM-PAC OT "6 Clicks" Daily Activity     Outcome Measure Help from another person eating meals?: A Little Help from another person taking care of personal grooming?: A Little Help from another person toileting, which includes using toliet, bedpan, or urinal?: A Lot Help from another person bathing (including washing, rinsing, drying)?: A Lot Help from another person to put on and taking off regular upper body clothing?: A Lot Help from another person to put on and taking off regular lower body clothing?: A Lot 6 Click Score: 14   End of Session Equipment Utilized During Treatment: Gait belt Nurse Communication: Mobility status  Activity Tolerance: Patient tolerated treatment  well Patient left: in bed;with call bell/phone within  reach  OT Visit Diagnosis: Unsteadiness on feet (R26.81);Other abnormalities of gait and mobility (R26.89);Other symptoms and signs involving cognitive function                Time: 6948-5462 OT Time Calculation (min): 26 min Charges:  OT General Charges $OT Visit: 1 Visit OT Evaluation $OT Eval Moderate Complexity: [place], MSOT, OTR/L Acute Rehabilitation Services [facility] Office Number: [phone] Pager: [phone]  Zenovia Jarred 11/13/2019, 6:15 PM

## 2019-11-13 NOTE — Progress Notes (Signed)
11/13/19 1830  PT Visit Information  Last PT Received On 11/13/19  Assistance Needed +2  PT/OT/SLP Co-Evaluation/Treatment Yes  Reason for Co-Treatment Complexity of the patient's impairments (multi-system involvement);Necessary to address cognition/behavior during functional activity  PT goals addressed during session Mobility/safety with mobility;Balance  History of Present Illness Jasmine Woodward is an 86 y.o. female from [facility] presenting with L sided weakness. 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 PMH includes Alzheimer dementia, atrial fibrillation (not on anticoagulation) and HTN.  Precautions  Precautions Fall  Restrictions  Weight Bearing Restrictions No  Home Living  Family/patient expects to be discharged to: Skilled nursing facility  Additional Comments Pt from [facility]  Prior Function  Comments pt not able to provide report. She does say she uses a RW. Assume she has assist for BADLs  Communication  Communication Expressive difficulties;Receptive difficulties  Pain Assessment  Pain Assessment Faces  Faces Pain Scale 0  Cognition  Arousal/Alertness Awake/alert  Behavior During Therapy Restless  Overall Cognitive Status No family/caregiver present to determine baseline cognitive functioning  Area of Impairment Orientation;Attention;Memory;Following commands;Safety/judgement;Awareness;Problem solving  Orientation Level Disoriented to;Place;Time;Situation  Current Attention Level Focused  Memory Decreased recall of precautions;Decreased short-term memory  Following Commands Follows one step commands inconsistently  Safety/Judgement Decreased awareness of safety;Decreased awareness of deficits  Awareness Intellectual  Problem Solving Slow processing;Decreased initiation;Difficulty sequencing;Requires verbal cues;Requires tactile cues  General Comments Pt very perseverative and with poor sequencing  throughout session  Upper Extremity Assessment  Upper Extremity Assessment Defer to OT evaluation  Lower Extremity Assessment  Lower Extremity Assessment Generalized weakness;Difficult to assess due to impaired cognition  Cervical / Trunk Assessment  Cervical / Trunk Assessment Kyphotic  Bed Mobility  Overal bed mobility Needs Assistance  Bed Mobility Supine to Sit;Sit to Supine  Supine to sit +2 for physical assistance;+2 for safety/equipment;Max assist  Sit to supine Max assist;+2 for physical assistance;+2 for safety/equipment  General bed mobility comments Pt was able to pull up into long sitting with min A, but required max A +2 to sequence and problem solve transfer to EOB. Pt not wanting to sit on stretcher and perseverating on needing to pull up underwear, so required max A +2 to sit and transfer to supine.   Transfers  Overall transfer level Needs assistance  Equipment used 2 person hand held assist  Transfers Sit to/from Stand  Sit to Stand Mod assist;+2 physical assistance;+2 safety/equipment  General transfer comment increased time and cueing needed for sequencing and to stand from high stretcher. Pt perseverative on pulling up underwear and needing max physical cueing to initiate sitting  Ambulation/Gait  Ambulation/Gait assistance Mod assist;+2 physical assistance;+2 safety/equipment  Assistive device 2 person hand held assist  General Gait Details Pt requiring assist for steadying and to weightshift in order to take side steps at EOB. Poor sequencing noted.   Modified Rankin (Stroke Patients Only)  Pre-Morbid Rankin Score 4  Modified Rankin 4  Balance  Overall balance assessment Needs assistance  Sitting-balance support Bilateral upper extremity supported;Feet supported  Sitting balance-Leahy Scale Fair  Standing balance support Bilateral upper extremity supported;During functional activity  Standing balance-Leahy Scale Poor  Standing balance comment Reliant on BUE and  external support   PT - End of Session  Equipment Utilized During Treatment Gait belt  Activity Tolerance Patient tolerated treatment well  Patient left in bed;with call bell/phone within reach  Nurse Communication Mobility status  PT Assessment  PT  Recommendation/Assessment Patient needs continued PT services  PT Visit Diagnosis Unsteadiness on feet (R26.81);Muscle weakness (generalized) (M62.81);Difficulty in walking, not elsewhere classified (R26.2)  PT Problem List Decreased strength;Decreased balance;Decreased mobility;Decreased cognition;Decreased coordination;Decreased knowledge of use of DME;Decreased safety awareness;Decreased knowledge of precautions  PT Plan  PT Frequency (ACUTE ONLY) Min 3X/week  PT Treatment/Interventions (ACUTE ONLY) Gait training;DME instruction;Functional mobility training;Therapeutic activities;Therapeutic exercise;Balance training;Patient/family education;Cognitive remediation;Neuromuscular re-education  AM-PAC PT "6 Clicks" Mobility Outcome Measure (Version 2)  Help needed turning from your back to your side while in a flat bed without using bedrails? 2  Help needed moving from lying on your back to sitting on the side of a flat bed without using bedrails? 1  Help needed moving to and from a bed to a chair (including a wheelchair)? 2  Help needed standing up from a chair using your arms (e.g., wheelchair or bedside chair)? 2  Help needed to walk in hospital room? 2  Help needed climbing 3-5 steps with a railing?  1  6 Click Score 10  Consider Recommendation of Discharge To: CIR/SNF/LTACH  PT Recommendation  Follow Up Recommendations SNF;Supervision/Assistance - 24 hour  PT equipment None recommended by PT  Individuals Consulted  Consulted and Agree with Results and Recommendations Patient unable/family or caregiver not available  Acute Rehab PT Goals  PT Goal Formulation Patient unable to participate in goal setting  Time For Goal Achievement 11/27/19   Potential to Achieve Goals Fair  PT Time Calculation  PT Start Time (ACUTE ONLY) 1412  PT Stop Time (ACUTE ONLY) 1439  PT Time Calculation (min) (ACUTE ONLY) 27 min  PT General Charges  $$ ACUTE PT VISIT 1 Visit  PT Evaluation  $PT Eval Moderate Complexity 1 Mod   Pt admitted secondary to problem above with deficits above. Pt with notable cognitive deficits and difficulty sequencing throughout mobility tasks. Required mod to max A +2 throughout mobility tasks. Required physical assist to weightshift to take steps. Feel pt is at increased risk for falls. Recommend return to SNF at d/c to increase independence and safety with mobility tasks. Will continue to follow acutely.  Farley Ly, PT, DPT  Acute Rehabilitation Services  Pager: [phone] Office: [phone]

## 2019-11-13 NOTE — ED Notes (Signed)
Pt restful at this time.  Neuro PA finished with pt assessment.  She denies any needs at this time.  Would like to sleep.

## 2019-11-13 NOTE — H&P (Signed)
History and Physical    Jasmine Woodward SHF:[id number] DOB: March 06, 1928 DOA: 11/12/2019  PCP: Georgianne Fick, MD   Patient coming from: ALF   Chief Complaint: Left-sided weakness, dysarthria   HPI: Jasmine Woodward is a 86 y.o. female with medical history significant for Alzheimer dementia, hypertension, and atrial fibrillation not on anticoagulation, now presenting to the emergency department for evaluation of left-sided weakness and slurred speech.  Patient had reportedly been in her usual state of health at around 6 PM when she went to dinner, but was noted at approximately 9 PM to be unable to get up from the dinner table on her own, appeared to have weakness involving the left arm and leg, had a left facial droop noted, and was having slurred speech.  EMS was called and reports that patient was close to baseline by time of their arrival.  Patient does not remember the events and believes that she is still at home during her interview in the ED.  She denies any chest pain, headaches, or fevers, but is noted to be disoriented.  ED Course: Upon arrival to the ED, patient is found to be afebrile, saturating well on room air, and with stable blood pressure.  EKG features a sinus rhythm.  Chemistry panel with creatinine 1.18, slightly higher than priors.  CBC with mild normocytic anemia.  Hospitalist were consulted for admission.  Neurology was consulted by the ED physician and was recommended to call back if MRI is positive.   Review of Systems:  Unable to complete ROS secondary the patient's clinical condition.  Past Medical History:  Diagnosis Date  . Alzheimer disease (HCC)   . Atrial fibrillation (HCC)   . Hypertension     History reviewed. No pertinent surgical history.   reports that she has never smoked. She does not have any smokeless tobacco history on file. She reports previous alcohol use. No history on file for drug.  Allergies  Allergen Reactions  . Bee Venom     Bee  Stings.    History reviewed. No pertinent family history.   Prior to Admission medications   Medication Sig Start Date End Date Taking? Authorizing Provider  aspirin 325 MG tablet Take 325 mg by mouth daily.    [provider]  bimatoprost (LUMIGAN) 0.01 % SOLN 1 drop at bedtime.    [provider]  Calcium Carb-Cholecalciferol (CALCIUM 600 + D PO) Take 1 tablet by mouth in the morning and at bedtime.    [provider]  donepezil (ARICEPT) 5 MG tablet Take 5 mg by mouth at bedtime.    [provider]  indapamide (LOZOL) 2.5 MG tablet Take 2.5 mg by mouth every morning.    [provider]  Metoprolol Succinate 50 MG CS24 Take 1 tablet by mouth daily.    [provider]  Multiple Vitamins-Minerals (CENTRUM SILVER PO) Take 8 mg by mouth daily. 455mcg-300mcg.    [provider]  triamcinolone cream (KENALOG) 0.1 % Apply 1 application topically 2 (two) times daily as needed.    [provider]    Physical Exam: Vitals:   11/12/19 2256 11/12/19 2300  BP:  (!) 142/46  Pulse:  64  Resp:  14  Temp: 98.4 F (36.9 C)   TempSrc: Oral   SpO2:  99%    Constitutional: NAD, calm  Eyes: PERTLA, lids and conjunctivae normal ENMT: Mucous membranes are moist. Posterior pharynx clear of any exudate or lesions.   Neck: normal, supple, no  masses, no thyromegaly Respiratory:  no wheezing, no crackles. No accessory muscle use.  Cardiovascular: S1 & S2 heard, regular rate and rhythm. Pretibial pitting edema bilaterally.   Abdomen: No distension, no tenderness, soft. Bowel sounds active.  Musculoskeletal: no clubbing / cyanosis. No joint deformity upper and lower extremities.   Skin: no significant rashes, lesions, ulcers. Warm, dry, well-perfused. Neurologic: No gross facial asymmetry, PEERL. Sensation to light touch intact. Strength 5/5 in all 4 limbs.  Psychiatric: Alert and oriented to person only. Calm and cooperative.     Labs and Imaging on Admission: I have personally reviewed following labs and imaging studies  CBC: Recent Labs  Lab 11/12/19 2259  WBC 7.7  HGB 11.6*  HCT 35.5*  MCV 92.9  PLT 481   Basic Metabolic Panel: Recent Labs  Lab 11/12/19 2259  NA 140  K 4.4  CL 101  CO2 27  GLUCOSE 104*  BUN 27*  CREATININE 1.18*  CALCIUM 9.3   GFR: CrCl cannot be calculated (Unknown ideal weight.). Liver Function Tests: No results for input(s): AST, ALT, ALKPHOS, BILITOT, PROT, ALBUMIN in the last 168 hours. No results for input(s): LIPASE, AMYLASE in the last 168 hours. No results for input(s): AMMONIA in the last 168 hours. Coagulation Profile: No results for input(s): INR, PROTIME in the last 168 hours. Cardiac Enzymes: No results for input(s): CKTOTAL, CKMB, CKMBINDEX, TROPONINI in the last 168 hours. BNP (last 3 results) No results for input(s): PROBNP in the last 8760 hours. HbA1C: No results for input(s): HGBA1C in the last 72 hours. CBG: No results for input(s): GLUCAP in the last 168 hours. Lipid Profile: No results for input(s): CHOL, HDL, LDLCALC, TRIG, CHOLHDL, LDLDIRECT in the last 72 hours. Thyroid Function Tests: No results for input(s): TSH, T4TOTAL, FREET4, T3FREE, THYROIDAB in the last 72 hours. Anemia Panel: No results for input(s): VITAMINB12, FOLATE, FERRITIN, TIBC, IRON, RETICCTPCT in the last 72 hours. Urine analysis:    Component Value Date/Time   COLORURINE YELLOW 11/12/2019 2255   APPEARANCEUR CLEAR 11/12/2019 2255   LABSPEC 1.013 11/12/2019 2255   PHURINE 6.0 11/12/2019 2255   GLUCOSEU NEGATIVE 11/12/2019 2255   HGBUR NEGATIVE 11/12/2019 2255   BILIRUBINUR NEGATIVE 11/12/2019 2255   KETONESUR NEGATIVE 11/12/2019 2255   PROTEINUR NEGATIVE 11/12/2019 2255   UROBILINOGEN 0.2 04/30/2014 1345   NITRITE NEGATIVE 11/12/2019 2255   LEUKOCYTESUR NEGATIVE 11/12/2019 2255   Sepsis Labs: @LABRCNTIP (procalcitonin:4,lacticidven:4) )No results found for this or  any previous visit (from the past 240 hour(s)).   Radiological Exams on Admission: No results found.  EKG: Independently reviewed. Sinus rhythm.   Assessment/Plan   1. Transient left-sided weakness and dysarthria  - Presents after an episode of acute left-sided weakness and slurred speech, seemed to be back to baseline in ED  - Neurology was contacted by ED physician and recommended call back if MRI brain is positive  - Continue cardiac monitoring, neuro checks, NPO pending swallow screen, therapy assessments, check MRI brain   2. Paroxysmal atrial fibrillation  - In sinus rhythm on admission  - CHADS-VASc at least 4 (age x2, gender, HTN)  - She is not anticoagulated prior to arrival, unclear why   3. Dementia  - At baseline, is able to feed self, ambulates with walker, is disoriented but able to recognize her son and daughter  - Continue supportive care   4. Mild renal insufficiency  - SCr is 1.18 on admission, similar to priors  - Renally-dose medications, monitor    5.  Hypertension  - BP at goal, pharmacy medication-reconciliation pending    DVT prophylaxis: Lovenox  Code Status: DNR, confirmed with patient's son on admission  Family Communication: Son updated by phone  Disposition Plan:  Patient is from: ALF   Anticipated d/c is to: TBD Anticipated d/c date is: 11/14/19 Patient currently: Pending MRI brain, therapy assessments  Consults called: None  Admission status: Observation     Briscoe Deutscher, MD [facility] Pager: See [email or web address]  If 7AM-7PM, please contact the daytime attending [email or web address]  11/13/2019, 12:46 AM

## 2019-11-13 NOTE — Consult Note (Signed)
NEURO HOSPITALIST CONSULT NOTE   Requestig physician: Dr. Renford Dills  Reason for Consult: Stroke  History obtained from:  Chart   HPI:                                                                                                                                          Jasmine Woodward is an 86 y.o. female with a history of Alzheimer dementia, atrial fibrillation (not on anticoagulation) and HTN who was brought in by EMS for evaluation of a possible stroke. She was at [facility], [place] at 6 PM Tuesday night, when she went to dinner. At 9pm a Engineer, materials for the facility noticed that the patient was staying at the table longer than usual and that she also had slurred speech, left sided weakness, left facial droop and difficulty standing. On EMS arrival, she "was back to baseline" and ambulatory with her walker. EMS VS: BP 170/70, HR 72, RR 20.  In the ED she appeared to be improving, but possible left sided neglect was noted. She also had difficulty ambulating and required 2 person assist. She was not a tPA candidate due to time criteria.   MRI brain revealed an acute right PCA territory infarct with petechial hemorrhage. Also noted was advanced brain atrophy correlating with her history of Alzheimer's disease.  Her home medications include ASA.   Past Medical History:  Diagnosis Date  . Alzheimer disease (HCC)   . Atrial fibrillation (HCC)   . Hypertension     History reviewed. No pertinent surgical history.  History reviewed. No pertinent family history.            Social History:  reports that she has never smoked. She does not have any smokeless tobacco history on file. She reports previous alcohol use. No history on file for drug.  Allergies  Allergen Reactions  . Bee Venom     Bee Stings.    HOME MEDICATIONS:                                                                                                                      No current  facility-administered medications on file prior to encounter.   Current Outpatient Medications on File Prior to Encounter  Medication Sig Dispense Refill  .  aspirin 325 MG tablet Take 325 mg by mouth daily.    . bimatoprost (LUMIGAN) 0.01 % SOLN 1 drop at bedtime.    . Calcium Carb-Cholecalciferol (CALCIUM 600 + D PO) Take 1 tablet by mouth in the morning and at bedtime.    . donepezil (ARICEPT) 5 MG tablet Take 5 mg by mouth at bedtime.    . indapamide (LOZOL) 2.5 MG tablet Take 2.5 mg by mouth every morning.    . Metoprolol Succinate 50 MG CS24 Take 1 tablet by mouth daily.    . Multiple Vitamins-Minerals (CENTRUM SILVER PO) Take 8 mg by mouth daily. 427mcg-300mcg.    . triamcinolone cream (KENALOG) 0.1 % Apply 1 application topically 2 (two) times daily as needed.       ROS:                                                                                                                                       Unreliable historian due to dementia. In the ED, she denied fever, headache and CP.   Blood pressure (!) 150/66, pulse 70, temperature 98.4 F (36.9 C), temperature source Oral, resp. rate (!) 22, SpO2 100 %.   General Examination:                                                                                                       Physical Exam  HEENT: Normocephalic, atraumatic, no lesions Lungs: Clear to auscultation Ext: Warm dry and intact  Neurological Examination-examination was limited secondary to patient having difficulty following commands Mental Status: Drowsy, not oriented, has dentures and slightly dysarthric, no aphasia in the context of sparse speech. Is able to follow single step commands with some prompting Cranial Nerves: II: Blinks to threat bilaterally. PERRL III,IV, VI: At rest ptosis of the right eye, EOMI V,VII: smile symmetric, facial light touch sensation normal bilaterally VIII: hearing normal bilaterally IX,X: uvula rises symmetrically XI:  bilateral shoulder shrug XII: midline tongue extension Motor: Right : Upper extremity   5/5    Left:     Upper extremity   5/5  Lower extremity   5/5     Lower extremity   4/5 with some give way aspect Tone and bulk: Normal tone throughout; no atrophy noted Sensory: Pinprick and light touch intact throughout, bilaterally Deep Tendon Reflexes: 2+ and symmetric throughout Plantars: Right: Downgoing   Left: Mute Cerebellar:  Normal finger-to-nose     Lab Results: Basic Metabolic Panel: Recent Labs  Lab 11/12/19 2259  NA 140  K 4.4  CL 101  CO2 27  GLUCOSE 104*  BUN 27*  CREATININE 1.18*  CALCIUM 9.3    CBC: Recent Labs  Lab 11/12/19 2259  WBC 7.7  HGB 11.6*  HCT 35.5*  MCV 92.9  PLT 239    Cardiac Enzymes: No results for input(s): CKTOTAL, CKMB, CKMBINDEX, TROPONINI in the last 168 hours.  Lipid Panel: Recent Labs  Lab 11/13/19 0616  CHOL 141  TRIG 46  HDL 64  CHOLHDL 2.2  VLDL 9  LDLCALC 68   A1c- 5.3    Imaging: MR BRAIN WO CONTRAST Result Date: 11/13/2019 CLINICAL DATA:  Stroke-like symptoms EXAM: MRI HEAD WITHOUT CONTRAST TECHNIQUE: Multiplanar, multiecho pulse sequences of the brain and surrounding structures were obtained without intravenous contrast. COMPARISON:  04/30/2014 FINDINGS: Brain: Confluent restricted diffusion in the right occipital and posteromedial temporal lobes with small acute infarct at the right thalamus. Petechial hemorrhage is seen at the level of the cortical infarct. Advanced brain atrophy, especially at the temporal and parietal lobes. There has been prior infarct at the bilateral parietal lobes. No acute hemorrhage, hydrocephalus, or masslike finding. Vascular: Major flow voids are preserved Skull and upper cervical spine: No focal marrow lesion. Sinuses/Orbits: Bilateral cataract resection IMPRESSION: 1. Acute right PCA territory infarct with petechial hemorrhage. 2. Advanced brain atrophy correlating with history of Alzheimer's  disease. Electronically Signed   By: Marnee Spring M.D.   On: 11/13/2019 05:55   Recent TTE from 10/24/19: 1. Left ventricular ejection fraction, by estimation, is 60 to 65%. The left ventricle has normal function. The left ventricle has no regional wall motion abnormalities. Left ventricular diastolic parameters are consistent with Grade I diastolic dysfunction (impaired relaxation).  2. Right ventricular systolic function is normal. The right ventricular size is normal. There is mildly elevated pulmonary artery systolic pressure. The estimated right ventricular systolic pressure is 39.7 mmHg.  3. Left atrial size was moderately dilated.  4. The mitral valve is normal in structure. Mild mitral valve regurgitation. No evidence of mitral stenosis.  5. The aortic valve is normal in structure. Aortic valve regurgitation is not visualized. No aortic stenosis is present.  6. The inferior vena cava is normal in size with greater than 50% respiratory variability, suggesting right atrial pressure of 3 mmHg.   Assessment: 86 year old female with acute right PCA territory ischemic infarction 1. Exam reveals mild dysarthria secondary to dentures which are loose, right ptosis, left leg weaker than right leg with a giveway component and mute toe on the left. 2. MRI brain reveals an acute right PCA territory infarct with petechial hemorrhage. Also noted was advanced brain atrophy correlating with her history of Alzheimer's disease. 3. Stroke risk factors: Atrial fibrillation and HTN 4. Elevated BUN/Cr ratio, suggestive of volume depletion.   Recommendations: 1. Carotid dopplers 2. MRA of the brain without contrast 3. PT consult, OT consult, Speech consult 4. Frequent neuro checks 5. Prophylactic therapy- Continue ASA 325 mg daily. Add Plavix 75 mg po qd as patient has failed ASA monotherapy.  6. Risk factor modification 7. Telemetry monitoring 8. Modified permissive HTN protocol due to advanced age:  Correct SBP if > 180. After 24 hours, normalize BP gradually to a goal of 120-140/80-90. 9. NPO until passes stroke swallow screen   Electronically signed: Dr. Caryl Pina 11/13/2019, 7:28 AM

## 2019-11-13 NOTE — Progress Notes (Signed)
Patient is a 86 year old female with medical history of Alzheimer's dementia, hypertension, atrial fibrillation not on anticoagulant presented to the emerge department with complaints of left-sided weakness, slurred speech.  On presentation, she was hemodynamically stable.  Stroke was suspected.  MRI showed acute right PCA territory infarct with petechial hemorrhage. Neurology has been consulted.  Started on aspirin.  DVT prophylaxis with SCD. Stroke work-up initiated. Patient seen and examined at the bedside this morning in the emergency department.  Currently she is hemodynamically stable. She is currently oriented to place only.  She follows commands. She has generalized weakness.  Her speech is not slurred at the moment.  She has weakness in her left side on both upper and lower extremities more more than the right Patient seen by Dr. Antionette Char this morning.  I agree with his assessment and plan.

## 2019-11-13 NOTE — ED Provider Notes (Signed)
D/w her son via phone and he was updated on plan    Zadie Rhine, MD 11/13/19 [phone]

## 2019-11-13 NOTE — ED Notes (Signed)
Jasmine Woodward son [phone] would like to speak with admitting provider

## 2019-11-14 ENCOUNTER — Encounter (HOSPITAL_COMMUNITY): Payer: Self-pay | Admitting: Internal Medicine

## 2019-11-14 ENCOUNTER — Inpatient Hospital Stay (HOSPITAL_COMMUNITY): Payer: Medicare Other

## 2019-11-14 DIAGNOSIS — I63431 Cerebral infarction due to embolism of right posterior cerebral artery: Principal | ICD-10-CM

## 2019-11-14 DIAGNOSIS — G301 Alzheimer's disease with late onset: Secondary | ICD-10-CM

## 2019-11-14 LAB — BASIC METABOLIC PANEL
Anion gap: 7 (ref 5–15)
BUN: 19 mg/dL (ref 8–23)
CO2: 27 mmol/L (ref 22–32)
Calcium: 8.5 mg/dL — ABNORMAL LOW (ref 8.9–10.3)
Chloride: 102 mmol/L (ref 98–111)
Creatinine, Ser: 1.15 mg/dL — ABNORMAL HIGH (ref 0.44–1.00)
GFR calc Af Amer: 49 mL/min — ABNORMAL LOW (ref >60)
GFR calc non Af Amer: 42 mL/min — ABNORMAL LOW (ref >60)
Glucose, Bld: 132 mg/dL — ABNORMAL HIGH (ref 70–99)
Potassium: 4.3 mmol/L (ref 3.5–5.1)
Sodium: 136 mmol/L (ref 135–145)

## 2019-11-14 LAB — MRSA PCR SCREENING: MRSA by PCR: NEGATIVE

## 2019-11-14 MED ORDER — SODIUM CHLORIDE 0.9% FLUSH
10.0000 mL | Freq: Two times a day (BID) | INTRAVENOUS | Status: DC
  Administered 2019-11-14 – 2019-11-16 (×4): 10 mL

## 2019-11-14 MED ORDER — IOHEXOL 350 MG/ML SOLN
75.0000 mL | Freq: Once | INTRAVENOUS | Status: AC | PRN
  Administered 2019-11-14: 75 mL via INTRAVENOUS

## 2019-11-14 MED ORDER — SODIUM CHLORIDE 0.9% FLUSH: 10.0000 mL | INTRAVENOUS | Status: DC | PRN

## 2019-11-14 MED ORDER — LABETALOL HCL 5 MG/ML IV SOLN: 10.0000 mg | INTRAVENOUS | Status: DC | PRN

## 2019-11-14 MED ORDER — ATORVASTATIN CALCIUM 10 MG PO TABS
20.0000 mg | ORAL_TABLET | Freq: Every day | ORAL | Status: DC
  Administered 2019-11-14 – 2019-11-16 (×3): 20 mg via ORAL
  Filled 2019-11-14 (×3): qty 2

## 2019-11-14 NOTE — Progress Notes (Signed)
PROGRESS NOTE    Jasmine Woodward  IZT:[id number] DOB: 1928/05/30 DOA: 11/12/2019 PCP: [name], MD   Brief Narrative: Patient is a 86 year old female with medical history of Alzheimer's dementia, hypertension, atrial fibrillation not on anticoagulant presented to the emerge department with complaints of left-sided weakness, slurred speech.  On presentation, she was hemodynamically stable.  Stroke was suspected.Patient is a 86 year old female with medical history of Alzheimer's dementia, hypertension, atrial fibrillation not on anticoagulant presented to the emergency  department with complaints of left-sided weakness, slurred speech.  On presentation, she was hemodynamically stable.  Stroke was suspected.  MRI showed acute right PCA territory infarct with petechial hemorrhage. Neurology has been consulted and following.  Assessment & Plan:   Principal Problem:   Acute focal neurological deficit Active Problems:   Hypertension   Atrial fibrillation (HCC)   Alzheimer disease (HCC)   Mild renal insufficiency   Stroke ([place])   Acute nonhemorrhagic stroke: Presented with left-sided weakness, slurred speech. MRI showed acute right PCA territory infarct with petechial hemorrhage. Neurology has been consulted. Currently on aspirin 325 mg .  PT/OT/speech consulted. CTA head and neck ordered. LDL of 68.  Hemoglobin A1c of 5.3. On lipitor 20 mg daily  Paroximal A. fib: On sinus rhythm at present.She was pradaxa  before and it was discontinued by her PCP last year . CHADS-VASc at least 46 (age x2, gender, HTN)  Not on anticoagulation.  Currently rate is controlled.  Dementia: Assisted living facility resident. At baseline, she is able to feed herself, ambulates with help of walker but disoriented.  Recognizes her son and daughter.  Continue supportive care.  Currently she is oriented to self only.  Hypertension: Allow permissive hypertension.  Gradually normalize blood pressure in 5 to  7 days.  Continue as needed meds for severe hypertension.        DVT prophylaxis:SCD Code Status: DNR Family Communication: Called and discussed with Son on phone on 11/14/19 Status is: Inpatient  Remains inpatient appropriate because:Unsafe d/c plan   Dispo: The patient is from: Home              Anticipated d/c is to: SNF              Anticipated d/c date is: 2 days              Patient currently is not medically stable to d/c.     Consultants: Neurology  Procedures: None  Antimicrobials:  Anti-infectives (From admission, onward)   None      Subjective:  Patient seen and examined the bedside this morning.  Hemodynamically stable.  Her mental status has improved since yesterday and she is more alert, awake and tries to communicate but remains disoriented.  Remains bedbound and has  generalized weakness. Objective: Vitals:   11/13/19 2031 11/13/19 2332 11/14/19 0423 11/14/19 0809  BP: (!) 171/65 (!) 125/48 (!) 172/74   Pulse: 80 91 90   Resp: 18 16 15    Temp: 99.6 F (37.6 C) 98.2 F (36.8 C) 99 F (37.2 C)   TempSrc: Oral  Oral   SpO2: 97% 97% 100%   Weight:    78.9 kg   No intake or output data in the 24 hours ending 11/14/19 0850 Filed Weights   11/14/19 0809  Weight: 78.9 kg    Examination:  General exam: Not in distress,average built, extremely deconditioned elderly female HEENT:PERRL,Oral mucosa moist, Ear/Nose normal on gross exam Respiratory system: Bilateral equal air entry, normal vesicular breath  sounds, no wheezes or crackles  Cardiovascular system: S1 & S2 heard, RRR. No JVD, murmurs, rubs, gallops or clicks. No pedal edema. Gastrointestinal system: Abdomen is nondistended, soft and nontender. No organomegaly or masses felt. Normal bowel sounds heard. Central nervous system: Alert and awake, not oriented, has generalized weakness Extremities: No edema, no clubbing ,no cyanosis    Data Reviewed: I have personally reviewed following labs and  imaging studies  CBC: Recent Labs  Lab 11/12/19 2259  WBC 7.7  HGB 11.6*  HCT 35.5*  MCV 92.9  PLT 239   Basic Metabolic Panel: Recent Labs  Lab 11/12/19 2259 11/14/19 0350  NA 140 136  K 4.4 4.3  CL 101 102  CO2 27 27  GLUCOSE 104* 132*  BUN 27* 19  CREATININE 1.18* 1.15*  CALCIUM 9.3 8.5*   GFR: CrCl cannot be calculated (Unknown ideal weight.). Liver Function Tests: No results for input(s): AST, ALT, ALKPHOS, BILITOT, PROT, ALBUMIN in the last 168 hours. No results for input(s): LIPASE, AMYLASE in the last 168 hours. No results for input(s): AMMONIA in the last 168 hours. Coagulation Profile: Recent Labs  Lab 11/13/19 0106  INR 1.1   Cardiac Enzymes: No results for input(s): CKTOTAL, CKMB, CKMBINDEX, TROPONINI in the last 168 hours. BNP (last 3 results) No results for input(s): PROBNP in the last 8760 hours. HbA1C: Recent Labs    11/13/19 0616  HGBA1C 5.3   CBG: No results for input(s): GLUCAP in the last 168 hours. Lipid Profile: Recent Labs    11/13/19 0616  CHOL 141  HDL 64  LDLCALC 68  TRIG 46  CHOLHDL 2.2   Thyroid Function Tests: No results for input(s): TSH, T4TOTAL, FREET4, T3FREE, THYROIDAB in the last 72 hours. Anemia Panel: No results for input(s): VITAMINB12, FOLATE, FERRITIN, TIBC, IRON, RETICCTPCT in the last 72 hours. Sepsis Labs: No results for input(s): PROCALCITON, LATICACIDVEN in the last 168 hours.  Recent Results (from the past 240 hour(s))  Urine culture     Status: Abnormal   Collection Time: 11/12/19 10:55 PM   Specimen: Urine, Catheterized  Result Value Ref Range Status   Specimen Description URINE, CATHETERIZED  Final   Special Requests NONE  Final   Culture (A)  Final    <10,000 COLONIES/mL INSIGNIFICANT GROWTH Performed at [facility], [address], [place], [place] [address]    Report Status 11/13/2019 FINAL  Final  SARS CORONAVIRUS 2 (TAT 6-24 HRS) Nasopharyngeal Nasopharyngeal Swab     Status:  None   Collection Time: 11/13/19  4:04 AM   Specimen: Nasopharyngeal Swab  Result Value Ref Range Status   SARS Coronavirus 2 NEGATIVE NEGATIVE Final    Comment: (NOTE) SARS-CoV-2 target nucleic acids are NOT DETECTED. The SARS-CoV-2 RNA is generally detectable in upper and lower respiratory specimens during the acute phase of infection. Negative results do not preclude SARS-CoV-2 infection, do not rule out co-infections with other pathogens, and should not be used as the sole basis for treatment or other patient management decisions. Negative results must be combined with clinical observations, patient history, and epidemiological information. The expected result is Negative. Fact Sheet for Patients: HairSlick.no Fact Sheet for Healthcare Providers: quierodirigir.com This test is not yet approved or cleared by the [place] FDA and  has been authorized for detection and/or diagnosis of SARS-CoV-2 by FDA under an Emergency Use Authorization (EUA). This EUA will remain  in effect (meaning this test can be used) for the duration of the COVID-19 declaration  under Section 56 4(b)(1) of the Act, 21 U.S.C. section 360bbb-3(b)(1), unless the authorization is terminated or revoked sooner. Performed at [facility], [address], [place], [place] [address]   MRSA PCR Screening     Status: None   Collection Time: 11/14/19 12:06 AM   Specimen: Nasal Mucosa; Nasopharyngeal  Result Value Ref Range Status   MRSA by PCR NEGATIVE NEGATIVE Final    Comment:        The GeneXpert MRSA Assay (FDA approved for NASAL specimens only), is one component of a comprehensive MRSA colonization surveillance program. It is not intended to diagnose MRSA infection nor to guide or monitor treatment for MRSA infections. Performed at [facility], [address] [place], [place] [address]          Radiology Studies: MR BRAIN WO  CONTRAST  Result Date: 11/13/2019 CLINICAL DATA:  Stroke-like symptoms EXAM: MRI HEAD WITHOUT CONTRAST TECHNIQUE: Multiplanar, multiecho pulse sequences of the brain and surrounding structures were obtained without intravenous contrast. COMPARISON:  04/30/2014 FINDINGS: Brain: Confluent restricted diffusion in the right occipital and posteromedial temporal lobes with small acute infarct at the right thalamus. Petechial hemorrhage is seen at the level of the cortical infarct. Advanced brain atrophy, especially at the temporal and parietal lobes. There has been prior infarct at the bilateral parietal lobes. No acute hemorrhage, hydrocephalus, or masslike finding. Vascular: Major flow voids are preserved Skull and upper cervical spine: No focal marrow lesion. Sinuses/Orbits: Bilateral cataract resection IMPRESSION: 1. Acute right PCA territory infarct with petechial hemorrhage. 2. Advanced brain atrophy correlating with history of Alzheimer's disease. Electronically Signed   By: Marnee Spring M.D.   On: 11/13/2019 05:55        Scheduled Meds: .  stroke: mapping our early stages of recovery book   Does not apply Once  . aspirin  300 mg Rectal Daily   Or  . aspirin  325 mg Oral Daily  . atorvastatin  20 mg Oral Daily  . chlorhexidine  15 mL Mouth/Throat BID  . sodium chloride flush  3 mL Intravenous Q12H   Continuous Infusions: . sodium chloride       LOS: 1 day    Time spent: 25 mins.More than 50% of that time was spent in counseling and/or coordination of care.      Burnadette Pop, MD [facility] P6/08/2019, 8:50 AM

## 2019-11-14 NOTE — Progress Notes (Signed)
STROKE TEAM PROGRESS NOTE   INTERVAL HISTORY No family at bedside.  Patient sleepy drowsy, not cooperative on exam.  However seems that she still has left hemianopia with left neglect, but able to move all extremities symmetrically.  I discussed with son over the phone, he stated that patient was on Pradaxa before moving to assisted living facility [facility].  However, no anticoagulation on patient current medication list.  Presumed that she was taken off Pradaxa due to fall risk.  CTA head and neck pending.  PT/OT recommend SNF.  Vitals:   11/13/19 2031 11/13/19 2332 11/14/19 0423 11/14/19 0809  BP: (!) 171/65 (!) 125/48 (!) 172/74   Pulse: 80 91 90   Resp: 18 16 15    Temp: 99.6 F (37.6 C) 98.2 F (36.8 C) 99 F (37.2 C)   TempSrc: Oral  Oral   SpO2: 97% 97% 100%   Weight:    78.9 kg    CBC:  Recent Labs  Lab 11/12/19 2259  WBC 7.7  HGB 11.6*  HCT 35.5*  MCV 92.9  PLT 026    Basic Metabolic Panel:  Recent Labs  Lab 11/12/19 2259 11/14/19 0350  NA 140 136  K 4.4 4.3  CL 101 102  CO2 27 27  GLUCOSE 104* 132*  BUN 27* 19  CREATININE 1.18* 1.15*  CALCIUM 9.3 8.5*   Lipid Panel:     Component Value Date/Time   CHOL 141 11/13/2019 0616   TRIG 46 11/13/2019 0616   HDL 64 11/13/2019 0616   CHOLHDL 2.2 11/13/2019 0616   VLDL 9 11/13/2019 0616   LDLCALC 68 11/13/2019 0616   HgbA1c:  Lab Results  Component Value Date   HGBA1C 5.3 11/13/2019   Urine Drug Screen:     Component Value Date/Time   LABOPIA NONE DETECTED 11/13/2019 0106   COCAINSCRNUR NONE DETECTED 11/13/2019 0106   LABBENZ NONE DETECTED 11/13/2019 0106   AMPHETMU NONE DETECTED 11/13/2019 0106   THCU NONE DETECTED 11/13/2019 0106   LABBARB NONE DETECTED 11/13/2019 0106    Alcohol Level No results found for: ETH  IMAGING past 24 hours No results found.  PHYSICAL EXAM  Temp:  [98.2 F (36.8 C)-99.6 F (37.6 C)] 99.2 F (37.3 C) (06/03 1325) Pulse Rate:  [63-91] 68 (06/03  1325) Resp:  [15-20] 15 (06/03 0423) BP: (125-172)/(45-89) 131/45 (06/03 1325) SpO2:  [96 %-100 %] 96 % (06/03 1325) Weight:  [78.9 kg] 78.9 kg (06/03 0809)  General - Well nourished, well developed, drowsy sleepy.  Ophthalmologic - fundi not visualized due to noncooperation.  Cardiovascular - Regular rhythm and rate, not in A. fib.  Neuro - sleepy drowsy but arousable briefly. Not orientated to place, time or age.  Able to name 2/3, not repeat sentences for me but able to speak in short sentences coherently.  Moderate dysarthria, able to follow limited simple commands.  Not blinking to visual threat on the left, seem to have left neglect.  PERRL, right gaze preference, barely cross midline.  Facial symmetrical.  Tongue protrusion not corporative.  Bilateral upper extremities 3/5, bilateral lower extremity 2+/5.  DTR 1+, no Babinski.  Sensation not corporative, however finger-to-nose grossly intact bilaterally.  Gait not tested.   ASSESSMENT/PLAN [name] is a 86 y.o. female with history of Alzheimer dementia, atrial fibrillation (not on anticoagulation) and HTN presenting with slurred speech, L sided weakness, L facial droop and difficulty standing.  Stroke:   R PCA large infarct embolic secondary to AF  not on [facility]  MRI  R PCA infarct w/ petechial hemorrhage. Advanced atrophy.   CTA head & neck pending  2D Echo 10/2019 EF 60-65%. No source of embolus. LA moderate dilated  LDL 68    HgbA1c 5.3  SCDs for VTE prophylaxis  aspirin 325 mg daily prior to admission, now on aspirin 325 mg daily. Pt was on pradaxa in the past, but not on PTA due to fall risk. Discussed with son, he has no objection to [facility] but would like to decide after all stroke work up done.  Therapy recommendations:  SNF   Disposition:  pending (from [facility])  Atrial Fibrillation  Home anticoagulation:  none  . Hx fall 12/2018 w/ small SDH . Hx fall 04/2014, mechanical fall, no hemorrhage  . Hx syncope 02/2012  w/ LOC following bee sting w/ vagal episode. Laceration to LE w/ repair.  . Was on [facility] with pradaxa, but off in [facility] due to fall risk. Discussed with son, he has no objection to [facility] but would like to decide after all stroke work up done.   Hypertension  Stable . Permissive hypertension (OK if < 220/120) but gradually normalize in 5-7 days . Long-term BP goal normotensive  Other Stroke Risk Factors  Advanced age  Overweight, Body mass index is 29.86 kg/m., recommend weight loss, diet and exercise as appropriate   Other Active Problems  Baseline Alzheimer's dementia - on aricept - 09/2000 - behavioral health admission depression, dementia  CKD IIIa Cre 1.15  Hospital day # 1  Marvel Plan, MD PhD Stroke Neurology 11/14/2019 3:40 PM   To contact Stroke Continuity provider, please refer to [email or web address]. After hours, contact General Neurology

## 2019-11-14 NOTE — Evaluation (Signed)
Speech Language Pathology Evaluation Patient Details Name: Jasmine Woodward MRN: [id number] DOB: [birth year] Today's Date: 11/14/2019 Time: 4193-7902 SLP Time Calculation (min) (ACUTE ONLY): 14 min  Problem List:  Patient Active Problem List   Diagnosis Date Noted  . Acute focal neurological deficit 11/13/2019  . Stroke (HCC) 11/13/2019  . Hypertension   . Atrial fibrillation (HCC)   . Alzheimer disease (HCC)   . Mild renal insufficiency    Past Medical History:  Past Medical History:  Diagnosis Date  . Alzheimer disease (HCC)   . Atrial fibrillation (HCC)   . Hypertension    Past Surgical History: History reviewed. No pertinent surgical history. HPI:  86 yo female adm to [facility] with left sided weakness that resolved PTA per MD notes, found to have a right PCA infarct - with petechila hemmorhage and advanced atrophy per MRI.  Pt PMH + for Alzheimers's dementia, Afib, HTN - uses roling walker at [facility] per notes.  Speech eval ordered.  Per notes, pt was disoriented - pulling at lines last night.   Assessment / Plan / Recommendation Clinical Impression  Limited cognitive linguistic evaluation completed due to decreased participation/attention.  Pt with premorbid dementia and no family present to establish her baseline function.   Her language is intact for basic communication including answering questions re :basic needs *eg: desire tv on or off. Pt was oriented to herself and birthday month/day but not date, place, nor situation.  Decreased sustained attention present resulting in impaird ability to recall location and medical event within 10 minutes of being informed.  Pt returned demonstration of call bell use *pointing to proper button* but will require reinforcement for carryover to be possible.  No focal CN deficits apparent and articulation ability intact.  Of note, pt responded to approx 50% of SLPs questions and mostly remained with her eyes closed, however at end of session when  concluded - she opened her eye with improved efficiency of responding - suspect some behavior component to evaluation results.  SLP will follow up with pt/family to determine baseline function and improve cognitive linguistic skills that are impared as a result of this event to allow her to participate in her medical care.    SLP Assessment  SLP Recommendation/Assessment: Patient needs continued Speech Lanaguage Pathology Services SLP Visit Diagnosis: Cognitive communication deficit (R41.841)    Follow Up Recommendations  (TBD)    Frequency and Duration min 1 x/week  1 week      SLP Evaluation Cognition  Orientation Level: Oriented to person;Disoriented to place;Disoriented to time;Disoriented to situation Attention: Focused;Sustained Sustained Attention: Impaired Sustained Attention Impairment: Functional basic;Verbal basic Memory: Impaired(pt did not recall information re: being in hopital approx 10 minutes after being informed, stated she was "in a building where we live") Memory Impairment: Storage deficit;Retrieval deficit Awareness: Impaired Awareness Impairment: Intellectual impairment Problem Solving: Impaired Problem Solving Impairment: Verbal basic(when inquired re: what to do if need to have bowel movement, pt stated "just go", redirected to use call, able to verbalize importance of not trying to get OOB alone)       Comprehension  Auditory Comprehension Overall Auditory Comprehension: Appears within functional limits for tasks assessed Yes/No Questions: Not tested Commands: (comprehension for basic information reviewed intact, pt's decrease sustained attention impacting comprehension) Conversation: Simple Interfering Components: Attention EffectiveTechniques: Extra processing time;Repetition Visual Recognition/Discrimination Discrimination: Not tested Reading Comprehension Reading Status: Not tested    Expression Expression Primary Mode of Expression:  Verbal Verbal Expression Initiation: No impairment Level  of Generative/Spontaneous Verbalization: Sentence Repetition: (DNT) Naming: Not tested Pragmatics: Unable to assess Written Expression Dominant Hand: Right Written Expression: Not tested   Oral / Motor  Oral Motor/Sensory Function Overall Oral Motor/Sensory Function: Within functional limits Motor Speech Overall Motor Speech: Appears within functional limits for tasks assessed Respiration: Within functional limits Phonation: Normal Resonance: Within functional limits Articulation: Within functional limitis Intelligibility: Intelligible Motor Planning: Witnin functional limits Motor Speech Errors: Not applicable   GO                    Macario Golds 11/14/2019, 9:14 AM  Kathleen Lime, MS [place] Office [phone]

## 2019-11-14 NOTE — Plan of Care (Signed)
  Problem: Education: Goal: Knowledge of disease or condition will improve Outcome: Adequate for Discharge Goal: Knowledge of secondary prevention will improve Outcome: Adequate for Discharge Goal: Knowledge of patient specific risk factors addressed and post discharge goals established will improve Outcome: Adequate for Discharge   

## 2019-11-15 ENCOUNTER — Other Ambulatory Visit: Payer: Self-pay

## 2019-11-15 NOTE — [place] FL2 (Signed)
[place] MEDICAID FL2 LEVEL OF CARE SCREENING TOOL     IDENTIFICATION  Patient Name: Jasmine Woodward Birthdate: 12-Jun-1928 Sex: female Admission Date (Current Location): 11/12/2019  [facility] and [place] Number:  Herbalist and Address:  [facility] [place], [place] [address], [place], [place] [address]      Provider Number: [id number]  Attending Physician Name and Address:  Shelly Coss, MD  Relative Name and Phone Number:  Janyce Llanos - [phone]    Current Level of Care: Hospital Recommended Level of Care: [place] Prior Approval Number:    Date Approved/Denied:   PASRR Number: [id number](Eff. 11/15/19)  Discharge Plan: SNF    Current Diagnoses: Patient Active Problem List   Diagnosis Date Noted  . Acute focal neurological deficit 11/13/2019  . Stroke ([place]) 11/13/2019  . Hypertension   . Atrial fibrillation ([place])   . Alzheimer disease ([place])   . Mild renal insufficiency     Orientation RESPIRATION BLADDER Height & Weight     Self  Normal Incontinent, External catheter(Catheter placed 6/2) Weight: 173 lb 15.1 oz (78.9 kg) Height:     BEHAVIORAL SYMPTOMS/MOOD NEUROLOGICAL BOWEL NUTRITION STATUS      Continent Diet(Heart healthy)  AMBULATORY STATUS COMMUNICATION OF NEEDS Skin   Extensive Assist(Mod assistance - +2 physical assistance per PT) Verbally Normal                       Personal Care Assistance Level of Assistance  Bathing, Feeding, Dressing Bathing Assistance: Maximum assistance Feeding assistance: Limited assistance(Assistance with set-up) Dressing Assistance: Maximum assistance     Functional Limitations Info  Sight, Hearing, Speech Sight Info: Impaired(Wears reading glasses) Hearing Info: Adequate Speech Info: Adequate    SPECIAL CARE FACTORS FREQUENCY  PT (By licensed PT), OT (By licensed OT), Speech therapy     PT Frequency: Evaluated 6/2. PT at [facility] Eval and Treat a minimum of 5  days per week OT Frequency: Evaluated 6/2. OT at SNF Eval and Treat a minimum of 5 days per week     Speech Therapy Frequency: Evaluated 6/3      Contractures Contractures Info: Not present    Additional Factors Info  Code Status, Allergies Code Status Info: DNR Allergies Info: Bee venom           Current Medications (11/15/2019):  This is the current hospital active medication list Current Facility-Administered Medications  Medication Dose Route Frequency Provider Last Rate Last Admin  .  stroke: mapping our early stages of recovery book   Does not apply Once Opyd, Ilene Qua, MD   Stopped at 11/13/19 1928  . 0.9 %  sodium chloride infusion  250 mL Intravenous PRN Opyd, Ilene Qua, MD      . acetaminophen (TYLENOL) tablet 650 mg  650 mg Oral Q4H PRN Opyd, Ilene Qua, MD   650 mg at 11/15/19 0025   Or  . acetaminophen (TYLENOL) 160 MG/5ML solution 650 mg  650 mg Per Tube Q4H PRN Opyd, Ilene Qua, MD       Or  . acetaminophen (TYLENOL) suppository 650 mg  650 mg Rectal Q4H PRN Opyd, Ilene Qua, MD      . aspirin suppository 300 mg  300 mg Rectal Daily Opyd, Ilene Qua, MD       Or  . aspirin tablet 325 mg  325 mg Oral Daily Opyd, Ilene Qua, MD   325 mg at 11/15/19 1055  . atorvastatin (  LIPITOR) tablet 20 mg  20 mg Oral Daily Marvel Plan, MD   20 mg at 11/15/19 1055  . chlorhexidine (PERIDEX) 0.12 % solution 15 mL  15 mL Mouth/Throat BID Burnadette Pop, MD   15 mL at 11/15/19 1055  . labetalol (NORMODYNE) injection 10 mg  10 mg Intravenous Q2H PRN Adhikari, Amrit, MD      . senna-docusate (Senokot-S) tablet 1 tablet  1 tablet Oral QHS PRN Opyd, Lavone Neri, MD      . sodium chloride flush (NS) 0.9 % injection 10-40 mL  10-40 mL Intracatheter Q12H Adhikari, Amrit, MD   10 mL at 11/15/19 1056  . sodium chloride flush (NS) 0.9 % injection 10-40 mL  10-40 mL Intracatheter PRN Adhikari, Amrit, MD      . sodium chloride flush (NS) 0.9 % injection 3 mL  3 mL Intravenous Q12H Opyd, Lavone Neri, MD    3 mL at 11/15/19 1056  . sodium chloride flush (NS) 0.9 % injection 3 mL  3 mL Intravenous PRN Opyd, Lavone Neri, MD         Discharge Medications: Please see discharge summary for a list of discharge medications.  Relevant Imaging Results:  Relevant Lab Results:   Additional Information ss#[id number]  Cristobal Goldmann, LCSW

## 2019-11-15 NOTE — Progress Notes (Signed)
PROGRESS NOTE    Jasmine Woodward  ZOX:[id number] DOB: 14-Aug-1927 DOA: 11/12/2019 PCP: Georgianne Fick, MD   Brief Narrative: Patient is a 86 year old female with medical history of Alzheimer's dementia, hypertension, atrial fibrillation not on anticoagulant presented to the emerge department with complaints of left-sided weakness, slurred speech.  On presentation, she was hemodynamically stable.  Stroke was suspected.Patient is a 86 year old female with medical history of Alzheimer's dementia, hypertension, atrial fibrillation not on anticoagulant presented to the emergency  department with complaints of left-sided weakness, slurred speech.  On presentation, she was hemodynamically stable.  Stroke was suspected.  MRI showed acute right PCA territory infarct with petechial hemorrhage. Neurology has been consulted and following.  Assessment & Plan:   Principal Problem:   Acute focal neurological deficit Active Problems:   Hypertension   Atrial fibrillation (HCC)   Alzheimer disease (HCC)   Mild renal insufficiency   Stroke (HCC)   Acute nonhemorrhagic stroke: Presented with left-sided weakness, slurred speech. MRI showed acute right PCA territory infarct with petechial hemorrhage. Neurology has been consulted. Currently on aspirin 325 mg .  PT/OT/speech consulted. CTA head and neck showed no large vessel occlusion, 50% stenosis in proximal ICA. Echocardiogram done on 10/24/2019 showed left ventricular ejection fraction of 66 5%, grade 1 diastolic dysfunction, no obvious source for emboli LDL of 68.  Hemoglobin A1c of 5.3. On lipitor 20 mg daily  Paroximal A. fib: On sinus rhythm at present.She was pradaxa  before and it was discontinued by her PCP last year . CHADS-VASc at least 64 (age x2, gender, HTN)  Not on anticoagulation.  Currently rate is controlled. Son wants to wait for full stroke work-up before deciding on starting on anticoagulation.  I will leave the decision to start  anticoagulation for neurology  Dementia: Assisted living facility resident. At baseline, she is able to feed herself, ambulates with help of walker but disoriented.  Recognizes her son and daughter.  Continue supportive care.  Currently she is oriented to self only.  Hypertension: Allow permissive hypertension.  Gradually normalize blood pressure in 5 to 7 days.  Continue as needed meds for severe hypertension.        DVT prophylaxis:SCD Code Status: DNR Family Communication: Called and discussed with Son on phone on 11/14/19 Status is: Inpatient  Remains inpatient appropriate because:Unsafe d/c plan   Dispo: The patient is from: Home              Anticipated d/c is to: SNF              Anticipated d/c date is: 1 day              Patient currently is not medically stable to d/c.     Consultants: Neurology  Procedures: None  Antimicrobials:  Anti-infectives (From admission, onward)   None      Subjective:  Patient seen and examined the bedside this morning.  No new issues today.  Hemodynamically stable.  Remains confused, bedbound  Objective: Vitals:   11/14/19 2039 11/15/19 0012 11/15/19 0317 11/15/19 0744  BP: (!) 149/70 (!) 139/58 (!) 139/41 (!) 168/61  Pulse: 85 88 70 74  Resp: 17 20 16 18   Temp: 99.1 F (37.3 C) (!) 101.3 F (38.5 C) 99.8 F (37.7 C) 98.3 F (36.8 C)  TempSrc: Oral Oral Oral Oral  SpO2: 99% 96% 97% 97%  Weight:        Intake/Output Summary (Last 24 hours) at 11/15/2019 0846 Last data filed at 11/15/2019  0700 Gross per 24 hour  Intake --  Output 1050 ml  Net -1050 ml   Filed Weights   11/14/19 0809  Weight: 78.9 kg    Examination:  General exam: Extremely deconditioned elderly female Respiratory system: Bilateral equal air entry, normal vesicular breath sounds, no wheezes or crackles  Cardiovascular system: S1 & S2 heard, RRR. No JVD, murmurs, rubs, gallops or clicks. Gastrointestinal system: Abdomen is nondistended, soft and  nontender. No organomegaly or masses felt. Normal bowel sounds heard. Central nervous system: Not Alert and oriented.  Generalized weakness Extremities: No edema, no clubbing ,no cyanosis    Data Reviewed: I have personally reviewed following labs and imaging studies  CBC: Recent Labs  Lab 11/12/19 2259  WBC 7.7  HGB 11.6*  HCT 35.5*  MCV 92.9  PLT 239   Basic Metabolic Panel: Recent Labs  Lab 11/12/19 2259 11/14/19 0350  NA 140 136  K 4.4 4.3  CL 101 102  CO2 27 27  GLUCOSE 104* 132*  BUN 27* 19  CREATININE 1.18* 1.15*  CALCIUM 9.3 8.5*   GFR: CrCl cannot be calculated (Unknown ideal weight.). Liver Function Tests: No results for input(s): AST, ALT, ALKPHOS, BILITOT, PROT, ALBUMIN in the last 168 hours. No results for input(s): LIPASE, AMYLASE in the last 168 hours. No results for input(s): AMMONIA in the last 168 hours. Coagulation Profile: Recent Labs  Lab 11/13/19 0106  INR 1.1   Cardiac Enzymes: No results for input(s): CKTOTAL, CKMB, CKMBINDEX, TROPONINI in the last 168 hours. BNP (last 3 results) No results for input(s): PROBNP in the last 8760 hours. HbA1C: Recent Labs    11/13/19 0616  HGBA1C 5.3   CBG: No results for input(s): GLUCAP in the last 168 hours. Lipid Profile: Recent Labs    11/13/19 0616  CHOL 141  HDL 64  LDLCALC 68  TRIG 46  CHOLHDL 2.2   Thyroid Function Tests: No results for input(s): TSH, T4TOTAL, FREET4, T3FREE, THYROIDAB in the last 72 hours. Anemia Panel: No results for input(s): VITAMINB12, FOLATE, FERRITIN, TIBC, IRON, RETICCTPCT in the last 72 hours. Sepsis Labs: No results for input(s): PROCALCITON, LATICACIDVEN in the last 168 hours.  Recent Results (from the past 240 hour(s))  Urine culture     Status: Abnormal   Collection Time: 11/12/19 10:55 PM   Specimen: Urine, Catheterized  Result Value Ref Range Status   Specimen Description URINE, CATHETERIZED  Final   Special Requests NONE  Final   Culture (A)   Final    <10,000 COLONIES/mL INSIGNIFICANT GROWTH Performed at [facility], [address] [place], [place] [address]    Report Status 11/13/2019 FINAL  Final  SARS CORONAVIRUS 2 (TAT 6-24 HRS) Nasopharyngeal Nasopharyngeal Swab     Status: None   Collection Time: 11/13/19  4:04 AM   Specimen: Nasopharyngeal Swab  Result Value Ref Range Status   SARS Coronavirus 2 NEGATIVE NEGATIVE Final    Comment: (NOTE) SARS-CoV-2 target nucleic acids are NOT DETECTED. The SARS-CoV-2 RNA is generally detectable in upper and lower respiratory specimens during the acute phase of infection. Negative results do not preclude SARS-CoV-2 infection, do not rule out co-infections with other pathogens, and should not be used as the sole basis for treatment or other patient management decisions. Negative results must be combined with clinical observations, patient history, and epidemiological information. The expected result is Negative. Fact Sheet for Patients: HairSlick.no Fact Sheet for Healthcare Providers: quierodirigir.com This test is not yet approved or cleared  by the [place] and  has been authorized for detection and/or diagnosis of SARS-CoV-2 by FDA under an Emergency Use Authorization (EUA). This EUA will remain  in effect (meaning this test can be used) for the duration of the COVID-19 declaration under Section 56 4(b)(1) of the Act, 21 U.S.C. section 360bbb-3(b)(1), unless the authorization is terminated or revoked sooner. Performed at [facility], [address] [place], [place] [address]   MRSA PCR Screening     Status: None   Collection Time: 11/14/19 12:06 AM   Specimen: Nasal Mucosa; Nasopharyngeal  Result Value Ref Range Status   MRSA by PCR NEGATIVE NEGATIVE Final    Comment:        The GeneXpert MRSA Assay (FDA approved for NASAL specimens only), is one component of a comprehensive MRSA  colonization surveillance program. It is not intended to diagnose MRSA infection nor to guide or monitor treatment for MRSA infections. Performed at [facility], [address], [place], [place] [address]          Radiology Studies: CT ANGIO HEAD W OR WO CONTRAST  Result Date: 11/15/2019 CLINICAL DATA:  Follow-up examination for stroke. EXAM: CT ANGIOGRAPHY HEAD AND NECK TECHNIQUE: Multidetector CT imaging of the head and neck was performed using the standard protocol during bolus administration of intravenous contrast. Multiplanar CT image reconstructions and MIPs were obtained to evaluate the vascular anatomy. Carotid stenosis measurements (when applicable) are obtained utilizing NASCET criteria, using the distal internal carotid diameter as the denominator. CONTRAST:  75mL OMNIPAQUE IOHEXOL 350 MG/ML SOLN COMPARISON:  Prior MRI from 11/13/2019. FINDINGS: CT HEAD FINDINGS Brain: Fairly sizable evolving right PCA territory infarct again seen, stable in size and distribution as compared to previous MRI. Mild patchy involvement of the lateral right thalamus noted. Few scattered areas of mild hyperdensity within the evolving cytotoxic edema consistent with petechial hemorrhage, also seen on prior MRI. No frank hemorrhagic transformation. Localized edema without significant regional mass effect. No other new large vessel territory infarct. No other acute intracranial hemorrhage. Underlying atrophy with chronic small vessel ischemic disease with chronic by parietal infarcts noted. No mass lesion or midline shift. No hydrocephalus or extra-axial fluid collection. Vascular: No hyperdense vessel. Scattered vascular calcifications noted within the carotid siphons. Skull: Scalp soft tissues and calvarium within normal limits. Sinuses: Chronic right sphenoid sinusitis noted. Paranasal sinuses are otherwise largely clear. No mastoid effusion. Orbits: Globes and orbital soft tissues demonstrate no acute  finding. Review of the MIP images confirms the above findings CTA NECK FINDINGS Aortic arch: Visualized aortic arch of normal caliber with normal 3 vessel morphology. Moderate atheromatous change about the visualized arch and origin of the great vessels without hemodynamically significant stenosis. Visualized subclavian arteries widely patent. Right carotid system: Right common carotid artery widely patent from its origin to the bifurcation without stenosis. Minimal atheromatous change about the right bifurcation without stenosis. Scattered eccentric calcified plaque noted just distally within the proximal right ICA with associated short-segment stenosis of up to 50% by NASCET criteria (series 11, image 195). Right ICA otherwise widely patent distally to the skull base without stenosis or other abnormality. Left carotid system: Left common carotid artery patent from its origin to the bifurcation without stenosis. Mild atheromatous change about the left bifurcation without stenosis. Left ICA widely patent distally to the skull base without stenosis, dissection or occlusion. Vertebral arteries: Both vertebral arteries arise from the subclavian arteries. Left vertebral artery slightly dominant. Focal plaque at the  origin of the left vertebral artery with no more than mild stenosis. Vertebral arteries otherwise widely patent within the neck without stenosis, dissection or occlusion. Skeleton: No acute osseous abnormality. No discrete or worrisome osseous lesions. Moderate cervical spondylosis noted at C3-4 through C6-7. Patient is edentulous. Other neck: No other acute soft tissue abnormality within the neck. No mass lesion or adenopathy. Few scattered subcentimeter thyroid nodules noted, largest of which measures 6 mm, felt to be of no clinical significance given size and patient's advanced age. No follow-up imaging recommended regarding these lesions. Upper chest: 8 mm focus of ground-glass opacity noted within the  right upper lobe, which could reflect a small focus of atelectasis versus pneumonitis (series 11, image 350). Layering secretions/debris noted within the upper esophageal lumen. Small layering left pleural effusion partially visualized. Visualized upper chest demonstrates no other acute finding. Review of the MIP images confirms the above findings CTA HEAD FINDINGS Anterior circulation: Petrous segments widely patent bilaterally. Atheromatous change throughout the carotid siphons with associated mild to moderate multifocal stenosis (estimated 30-50%), slightly worse on the right. A1 segments widely patent. Normal anterior communicating artery complex. Anterior cerebral arteries widely patent to their distal aspects. No M1 stenosis or occlusion. Normal MCA bifurcations. Distal MCA branches well perfused and symmetric. Posterior circulation: Both vertebral arteries widely patent to the vertebrobasilar junction without stenosis. Neither PICA well visualized. Basilar widely patent to its distal aspect without stenosis. Superior cerebral arteries patent bilaterally. Both PCAs primarily supplied via the basilar. Both PCAs well perfused to their distal aspects without appreciable stenosis. Venous sinuses: Patent allowing for timing the contrast bolus. Anatomic variants: None significant. Review of the MIP images confirms the above findings IMPRESSION: CT HEAD IMPRESSION: 1. Continued interval evolution of right PCA territory infarct, stable in size and distribution as compared to previous MRI. Associated petechial hemorrhage without hemorrhagic transformation, also similar. 2. No other new acute intracranial abnormality. 3. Advanced cerebral atrophy with chronic small vessel ischemic disease, stable. CTA HEAD AND NECK IMPRESSION: 1. Negative CTA for large vessel occlusion. 2. 50% short-segment atheromatous stenosis involving the proximal right ICA. 3. Atheromatous plaque involving the carotid siphons with associated mild  to moderate diffuse narrowing. 4. No other hemodynamically significant or correctable stenosis about the major arterial vasculature of the head and neck. 5. Subcentimeter focus of ground-glass opacity within the right upper lobe as above, which could reflect a small focus of pneumonitis versus atelectasis. 6. Small layering left pleural effusion. Electronically Signed   By: Jeannine Boga M.D.   On: 11/15/2019 01:00   CT ANGIO NECK W OR WO CONTRAST  Result Date: 11/15/2019 CLINICAL DATA:  Follow-up examination for stroke. EXAM: CT ANGIOGRAPHY HEAD AND NECK TECHNIQUE: Multidetector CT imaging of the head and neck was performed using the standard protocol during bolus administration of intravenous contrast. Multiplanar CT image reconstructions and MIPs were obtained to evaluate the vascular anatomy. Carotid stenosis measurements (when applicable) are obtained utilizing NASCET criteria, using the distal internal carotid diameter as the denominator. CONTRAST:  27mL OMNIPAQUE IOHEXOL 350 MG/ML SOLN COMPARISON:  Prior MRI from 11/13/2019. FINDINGS: CT HEAD FINDINGS Brain: Fairly sizable evolving right PCA territory infarct again seen, stable in size and distribution as compared to previous MRI. Mild patchy involvement of the lateral right thalamus noted. Few scattered areas of mild hyperdensity within the evolving cytotoxic edema consistent with petechial hemorrhage, also seen on prior MRI. No frank hemorrhagic transformation. Localized edema without significant regional mass effect. No other new large vessel territory  infarct. No other acute intracranial hemorrhage. Underlying atrophy with chronic small vessel ischemic disease with chronic by parietal infarcts noted. No mass lesion or midline shift. No hydrocephalus or extra-axial fluid collection. Vascular: No hyperdense vessel. Scattered vascular calcifications noted within the carotid siphons. Skull: Scalp soft tissues and calvarium within normal limits.  Sinuses: Chronic right sphenoid sinusitis noted. Paranasal sinuses are otherwise largely clear. No mastoid effusion. Orbits: Globes and orbital soft tissues demonstrate no acute finding. Review of the MIP images confirms the above findings CTA NECK FINDINGS Aortic arch: Visualized aortic arch of normal caliber with normal 3 vessel morphology. Moderate atheromatous change about the visualized arch and origin of the great vessels without hemodynamically significant stenosis. Visualized subclavian arteries widely patent. Right carotid system: Right common carotid artery widely patent from its origin to the bifurcation without stenosis. Minimal atheromatous change about the right bifurcation without stenosis. Scattered eccentric calcified plaque noted just distally within the proximal right ICA with associated short-segment stenosis of up to 50% by NASCET criteria (series 11, image 195). Right ICA otherwise widely patent distally to the skull base without stenosis or other abnormality. Left carotid system: Left common carotid artery patent from its origin to the bifurcation without stenosis. Mild atheromatous change about the left bifurcation without stenosis. Left ICA widely patent distally to the skull base without stenosis, dissection or occlusion. Vertebral arteries: Both vertebral arteries arise from the subclavian arteries. Left vertebral artery slightly dominant. Focal plaque at the origin of the left vertebral artery with no more than mild stenosis. Vertebral arteries otherwise widely patent within the neck without stenosis, dissection or occlusion. Skeleton: No acute osseous abnormality. No discrete or worrisome osseous lesions. Moderate cervical spondylosis noted at C3-4 through C6-7. Patient is edentulous. Other neck: No other acute soft tissue abnormality within the neck. No mass lesion or adenopathy. Few scattered subcentimeter thyroid nodules noted, largest of which measures 6 mm, felt to be of no clinical  significance given size and patient's advanced age. No follow-up imaging recommended regarding these lesions. Upper chest: 8 mm focus of ground-glass opacity noted within the right upper lobe, which could reflect a small focus of atelectasis versus pneumonitis (series 11, image 350). Layering secretions/debris noted within the upper esophageal lumen. Small layering left pleural effusion partially visualized. Visualized upper chest demonstrates no other acute finding. Review of the MIP images confirms the above findings CTA HEAD FINDINGS Anterior circulation: Petrous segments widely patent bilaterally. Atheromatous change throughout the carotid siphons with associated mild to moderate multifocal stenosis (estimated 30-50%), slightly worse on the right. A1 segments widely patent. Normal anterior communicating artery complex. Anterior cerebral arteries widely patent to their distal aspects. No M1 stenosis or occlusion. Normal MCA bifurcations. Distal MCA branches well perfused and symmetric. Posterior circulation: Both vertebral arteries widely patent to the vertebrobasilar junction without stenosis. Neither PICA well visualized. Basilar widely patent to its distal aspect without stenosis. Superior cerebral arteries patent bilaterally. Both PCAs primarily supplied via the basilar. Both PCAs well perfused to their distal aspects without appreciable stenosis. Venous sinuses: Patent allowing for timing the contrast bolus. Anatomic variants: None significant. Review of the MIP images confirms the above findings IMPRESSION: CT HEAD IMPRESSION: 1. Continued interval evolution of right PCA territory infarct, stable in size and distribution as compared to previous MRI. Associated petechial hemorrhage without hemorrhagic transformation, also similar. 2. No other new acute intracranial abnormality. 3. Advanced cerebral atrophy with chronic small vessel ischemic disease, stable. CTA HEAD AND NECK IMPRESSION: 1. Negative CTA for  large vessel occlusion. 2. 50% short-segment atheromatous stenosis involving the proximal right ICA. 3. Atheromatous plaque involving the carotid siphons with associated mild to moderate diffuse narrowing. 4. No other hemodynamically significant or correctable stenosis about the major arterial vasculature of the head and neck. 5. Subcentimeter focus of ground-glass opacity within the right upper lobe as above, which could reflect a small focus of pneumonitis versus atelectasis. 6. Small layering left pleural effusion. Electronically Signed   By: Rise Mu M.D.   On: 11/15/2019 01:00        Scheduled Meds: .  stroke: mapping our early stages of recovery book   Does not apply Once  . aspirin  300 mg Rectal Daily   Or  . aspirin  325 mg Oral Daily  . atorvastatin  20 mg Oral Daily  . chlorhexidine  15 mL Mouth/Throat BID  . sodium chloride flush  10-40 mL Intracatheter Q12H  . sodium chloride flush  3 mL Intravenous Q12H   Continuous Infusions: . sodium chloride       LOS: 2 days    Time spent: 25 mins.More than 50% of that time was spent in counseling and/or coordination of care.      Burnadette Pop, MD [facility] P6/09/2019, 8:46 AM

## 2019-11-15 NOTE — Progress Notes (Addendum)
STROKE TEAM PROGRESS NOTE   INTERVAL HISTORY No family at bedside.  Patient is awake, sitting up in bed, finished eating breakfast.  Pt still has left hemianopia  with left neglect, but able to move all extremities symmetrically. I called son over the phone, no answer, did not leave VM d/t it being a home phone number. Pt's son previously stated that pt was Pradaxa before moving to assisted living facility at [facility].  However, no anticoagulation on patient current medication list.  Presumed that she was taken off Pradaxa due to fall risk. Pt's son willing to start pt on anticoagulation however I was not able to reach him via tele today. Will try again later this afternoon, if unsuccessful, will defer to primary team to f/u.  Vitals:   11/15/19 0012 11/15/19 0317 11/15/19 0744 11/15/19 1213  BP: (!) 139/58 (!) 139/41 (!) 168/61 (!) 160/56  Pulse: 88 70 74 75  Resp: 20 16 18 20   Temp: (!) 101.3 F (38.5 C) 99.8 F (37.7 C) 98.3 F (36.8 C) 98.1 F (36.7 C)  TempSrc: Oral Oral Oral Oral  SpO2: 96% 97% 97% 96%  Weight:        CBC:  Recent Labs  Lab 11/12/19 2259  WBC 7.7  HGB 11.6*  HCT 35.5*  MCV 92.9  PLT 239    Basic Metabolic Panel:  Recent Labs  Lab 11/12/19 2259 11/14/19 0350  NA 140 136  K 4.4 4.3  CL 101 102  CO2 27 27  GLUCOSE 104* 132*  BUN 27* 19  CREATININE 1.18* 1.15*  CALCIUM 9.3 8.5*   Lipid Panel:     Component Value Date/Time   CHOL 141 11/13/2019 0616   TRIG 46 11/13/2019 0616   HDL 64 11/13/2019 0616   CHOLHDL 2.2 11/13/2019 0616   VLDL 9 11/13/2019 0616   LDLCALC 68 11/13/2019 0616   HgbA1c:  Lab Results  Component Value Date   HGBA1C 5.3 11/13/2019   Urine Drug Screen:     Component Value Date/Time   LABOPIA NONE DETECTED 11/13/2019 0106   COCAINSCRNUR NONE DETECTED 11/13/2019 0106   LABBENZ NONE DETECTED 11/13/2019 0106   AMPHETMU NONE DETECTED 11/13/2019 0106   THCU NONE DETECTED 11/13/2019 0106   LABBARB NONE DETECTED  11/13/2019 0106    Alcohol Level No results found for: ETH  IMAGING past 24 hours CT ANGIO HEAD W OR WO CONTRAST  Result Date: 11/15/2019 CLINICAL DATA:  Follow-up examination for stroke. EXAM: CT ANGIOGRAPHY HEAD AND NECK TECHNIQUE: Multidetector CT imaging of the head and neck was performed using the standard protocol during bolus administration of intravenous contrast. Multiplanar CT image reconstructions and MIPs were obtained to evaluate the vascular anatomy. Carotid stenosis measurements (when applicable) are obtained utilizing NASCET criteria, using the distal internal carotid diameter as the denominator. CONTRAST:  52mL OMNIPAQUE IOHEXOL 350 MG/ML SOLN COMPARISON:  Prior MRI from 11/13/2019. FINDINGS: CT HEAD FINDINGS Brain: Fairly sizable evolving right PCA territory infarct again seen, stable in size and distribution as compared to previous MRI. Mild patchy involvement of the lateral right thalamus noted. Few scattered areas of mild hyperdensity within the evolving cytotoxic edema consistent with petechial hemorrhage, also seen on prior MRI. No frank hemorrhagic transformation. Localized edema without significant regional mass effect. No other new large vessel territory infarct. No other acute intracranial hemorrhage. Underlying atrophy with chronic small vessel ischemic disease with chronic by parietal infarcts noted. No mass lesion or midline shift. No hydrocephalus or extra-axial fluid collection.  Vascular: No hyperdense vessel. Scattered vascular calcifications noted within the carotid siphons. Skull: Scalp soft tissues and calvarium within normal limits. Sinuses: Chronic right sphenoid sinusitis noted. Paranasal sinuses are otherwise largely clear. No mastoid effusion. Orbits: Globes and orbital soft tissues demonstrate no acute finding. Review of the MIP images confirms the above findings CTA NECK FINDINGS Aortic arch: Visualized aortic arch of normal caliber with normal 3 vessel morphology.  Moderate atheromatous change about the visualized arch and origin of the great vessels without hemodynamically significant stenosis. Visualized subclavian arteries widely patent. Right carotid system: Right common carotid artery widely patent from its origin to the bifurcation without stenosis. Minimal atheromatous change about the right bifurcation without stenosis. Scattered eccentric calcified plaque noted just distally within the proximal right ICA with associated short-segment stenosis of up to 50% by NASCET criteria (series 11, image 195). Right ICA otherwise widely patent distally to the skull base without stenosis or other abnormality. Left carotid system: Left common carotid artery patent from its origin to the bifurcation without stenosis. Mild atheromatous change about the left bifurcation without stenosis. Left ICA widely patent distally to the skull base without stenosis, dissection or occlusion. Vertebral arteries: Both vertebral arteries arise from the subclavian arteries. Left vertebral artery slightly dominant. Focal plaque at the origin of the left vertebral artery with no more than mild stenosis. Vertebral arteries otherwise widely patent within the neck without stenosis, dissection or occlusion. Skeleton: No acute osseous abnormality. No discrete or worrisome osseous lesions. Moderate cervical spondylosis noted at C3-4 through C6-7. Patient is edentulous. Other neck: No other acute soft tissue abnormality within the neck. No mass lesion or adenopathy. Few scattered subcentimeter thyroid nodules noted, largest of which measures 6 mm, felt to be of no clinical significance given size and patient's advanced age. No follow-up imaging recommended regarding these lesions. Upper chest: 8 mm focus of ground-glass opacity noted within the right upper lobe, which could reflect a small focus of atelectasis versus pneumonitis (series 11, image 350). Layering secretions/debris noted within the upper  esophageal lumen. Small layering left pleural effusion partially visualized. Visualized upper chest demonstrates no other acute finding. Review of the MIP images confirms the above findings CTA HEAD FINDINGS Anterior circulation: Petrous segments widely patent bilaterally. Atheromatous change throughout the carotid siphons with associated mild to moderate multifocal stenosis (estimated 30-50%), slightly worse on the right. A1 segments widely patent. Normal anterior communicating artery complex. Anterior cerebral arteries widely patent to their distal aspects. No M1 stenosis or occlusion. Normal MCA bifurcations. Distal MCA branches well perfused and symmetric. Posterior circulation: Both vertebral arteries widely patent to the vertebrobasilar junction without stenosis. Neither PICA well visualized. Basilar widely patent to its distal aspect without stenosis. Superior cerebral arteries patent bilaterally. Both PCAs primarily supplied via the basilar. Both PCAs well perfused to their distal aspects without appreciable stenosis. Venous sinuses: Patent allowing for timing the contrast bolus. Anatomic variants: None significant. Review of the MIP images confirms the above findings IMPRESSION: CT HEAD IMPRESSION: 1. Continued interval evolution of right PCA territory infarct, stable in size and distribution as compared to previous MRI. Associated petechial hemorrhage without hemorrhagic transformation, also similar. 2. No other new acute intracranial abnormality. 3. Advanced cerebral atrophy with chronic small vessel ischemic disease, stable. CTA HEAD AND NECK IMPRESSION: 1. Negative CTA for large vessel occlusion. 2. 50% short-segment atheromatous stenosis involving the proximal right ICA. 3. Atheromatous plaque involving the carotid siphons with associated mild to moderate diffuse narrowing. 4. No other hemodynamically significant  or correctable stenosis about the major arterial vasculature of the head and neck. 5.  Subcentimeter focus of ground-glass opacity within the right upper lobe as above, which could reflect a small focus of pneumonitis versus atelectasis. 6. Small layering left pleural effusion. Electronically Signed   By: Rise MuBenjamin  McClintock M.D.   On: 11/15/2019 01:00   CT ANGIO NECK W OR WO CONTRAST  Result Date: 11/15/2019 CLINICAL DATA:  Follow-up examination for stroke. EXAM: CT ANGIOGRAPHY HEAD AND NECK TECHNIQUE: Multidetector CT imaging of the head and neck was performed using the standard protocol during bolus administration of intravenous contrast. Multiplanar CT image reconstructions and MIPs were obtained to evaluate the vascular anatomy. Carotid stenosis measurements (when applicable) are obtained utilizing NASCET criteria, using the distal internal carotid diameter as the denominator. CONTRAST:  75mL OMNIPAQUE IOHEXOL 350 MG/ML SOLN COMPARISON:  Prior MRI from 11/13/2019. FINDINGS: CT HEAD FINDINGS Brain: Fairly sizable evolving right PCA territory infarct again seen, stable in size and distribution as compared to previous MRI. Mild patchy involvement of the lateral right thalamus noted. Few scattered areas of mild hyperdensity within the evolving cytotoxic edema consistent with petechial hemorrhage, also seen on prior MRI. No frank hemorrhagic transformation. Localized edema without significant regional mass effect. No other new large vessel territory infarct. No other acute intracranial hemorrhage. Underlying atrophy with chronic small vessel ischemic disease with chronic by parietal infarcts noted. No mass lesion or midline shift. No hydrocephalus or extra-axial fluid collection. Vascular: No hyperdense vessel. Scattered vascular calcifications noted within the carotid siphons. Skull: Scalp soft tissues and calvarium within normal limits. Sinuses: Chronic right sphenoid sinusitis noted. Paranasal sinuses are otherwise largely clear. No mastoid effusion. Orbits: Globes and orbital soft tissues  demonstrate no acute finding. Review of the MIP images confirms the above findings CTA NECK FINDINGS Aortic arch: Visualized aortic arch of normal caliber with normal 3 vessel morphology. Moderate atheromatous change about the visualized arch and origin of the great vessels without hemodynamically significant stenosis. Visualized subclavian arteries widely patent. Right carotid system: Right common carotid artery widely patent from its origin to the bifurcation without stenosis. Minimal atheromatous change about the right bifurcation without stenosis. Scattered eccentric calcified plaque noted just distally within the proximal right ICA with associated short-segment stenosis of up to 50% by NASCET criteria (series 11, image 195). Right ICA otherwise widely patent distally to the skull base without stenosis or other abnormality. Left carotid system: Left common carotid artery patent from its origin to the bifurcation without stenosis. Mild atheromatous change about the left bifurcation without stenosis. Left ICA widely patent distally to the skull base without stenosis, dissection or occlusion. Vertebral arteries: Both vertebral arteries arise from the subclavian arteries. Left vertebral artery slightly dominant. Focal plaque at the origin of the left vertebral artery with no more than mild stenosis. Vertebral arteries otherwise widely patent within the neck without stenosis, dissection or occlusion. Skeleton: No acute osseous abnormality. No discrete or worrisome osseous lesions. Moderate cervical spondylosis noted at C3-4 through C6-7. Patient is edentulous. Other neck: No other acute soft tissue abnormality within the neck. No mass lesion or adenopathy. Few scattered subcentimeter thyroid nodules noted, largest of which measures 6 mm, felt to be of no clinical significance given size and patient's advanced age. No follow-up imaging recommended regarding these lesions. Upper chest: 8 mm focus of ground-glass  opacity noted within the right upper lobe, which could reflect a small focus of atelectasis versus pneumonitis (series 11, image 350). Layering secretions/debris noted within  the upper esophageal lumen. Small layering left pleural effusion partially visualized. Visualized upper chest demonstrates no other acute finding. Review of the MIP images confirms the above findings CTA HEAD FINDINGS Anterior circulation: Petrous segments widely patent bilaterally. Atheromatous change throughout the carotid siphons with associated mild to moderate multifocal stenosis (estimated 30-50%), slightly worse on the right. A1 segments widely patent. Normal anterior communicating artery complex. Anterior cerebral arteries widely patent to their distal aspects. No M1 stenosis or occlusion. Normal MCA bifurcations. Distal MCA branches well perfused and symmetric. Posterior circulation: Both vertebral arteries widely patent to the vertebrobasilar junction without stenosis. Neither PICA well visualized. Basilar widely patent to its distal aspect without stenosis. Superior cerebral arteries patent bilaterally. Both PCAs primarily supplied via the basilar. Both PCAs well perfused to their distal aspects without appreciable stenosis. Venous sinuses: Patent allowing for timing the contrast bolus. Anatomic variants: None significant. Review of the MIP images confirms the above findings IMPRESSION: CT HEAD IMPRESSION: 1. Continued interval evolution of right PCA territory infarct, stable in size and distribution as compared to previous MRI. Associated petechial hemorrhage without hemorrhagic transformation, also similar. 2. No other new acute intracranial abnormality. 3. Advanced cerebral atrophy with chronic small vessel ischemic disease, stable. CTA HEAD AND NECK IMPRESSION: 1. Negative CTA for large vessel occlusion. 2. 50% short-segment atheromatous stenosis involving the proximal right ICA. 3. Atheromatous plaque involving the carotid  siphons with associated mild to moderate diffuse narrowing. 4. No other hemodynamically significant or correctable stenosis about the major arterial vasculature of the head and neck. 5. Subcentimeter focus of ground-glass opacity within the right upper lobe as above, which could reflect a small focus of pneumonitis versus atelectasis. 6. Small layering left pleural effusion. Electronically Signed   By: Jeannine Boga M.D.   On: 11/15/2019 01:00    PHYSICAL EXAM  Temp:  [98.1 F (36.7 C)-101.3 F (38.5 C)] 98.1 F (36.7 C) (06/04 1213) Pulse Rate:  [70-88] 75 (06/04 1213) Resp:  [16-20] 20 (06/04 1213) BP: (139-168)/(41-83) 160/56 (06/04 1213) SpO2:  [96 %-99 %] 96 % (06/04 1213)  General - Well nourished, well developed, awake, alert, not oriented d/t dementia.  Ophthalmologic - fundi not visualized due to noncooperation.  Cardiovascular - Regular rhythm and rate, not in A. fib.  Neuro - awake, not orientated to place, time or age.  Able to name 2/3, able to repeat sentences for me and able to speak in short sentences coherently.  Moderate dysarthria, able to follow limited simple commands.  Not blinking to visual threat on the left, seem to have left neglect.  PERRL, right gaze preference, barely cross midline.  Facial symmetrical.  Tongue protrusion not corporative.  Bilateral upper extremities 3/5, bilateral lower extremity 2+/5.  DTR 1+, no Babinski.  Sensation not corporative, however finger-to-nose grossly intact bilaterally.  Gait not tested.   ASSESSMENT/PLAN Jasmine Woodward is a 86 y.o. female with history of Alzheimer dementia, atrial fibrillation (not on anticoagulation) and HTN presenting with slurred speech, L sided weakness, L facial droop and difficulty standing.  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 Advanced atrophy.   CTA head & neck negative for LVO, 50% stenosis of proximal R ICA,  atheromatous plaque of carotid siphons with mild-mod narrowing however these are anatomically not likely of being the etiology of her R PCA infarct.  2D Echo 10/2019 EF 60-65%. No source of embolus. LA moderate  dilated  LDL 68    HgbA1c 5.3  SCDs for VTE prophylaxis  aspirin 325 mg daily prior to admission, now on aspirin 325 mg daily. Pt was on pradaxa in the past, but not on PTA due to fall risk. Discussed with son, he is OK with restarting AC. Will recommend eliquis 5mg  bid in one week (11/21/19), continue ASA 325 at meantime.   Therapy recommendations:  SNF   Disposition:  pending (from [facility])  Atrial Fibrillation  Home anticoagulation:  none, decision pending. [facility] Hx fall 12/2018 w/ small SDH . Hx fall 04/2014, mechanical fall, no hemorrhage  . Hx syncope 02/2012 w/ LOC following bee sting w/ vagal episode. Laceration to LE w/ repair.  . Was on [facility] with pradaxa, but off in [facility] due to fall risk. Discussed with son, he agree with restarting AC. Will recommend eliquis as above   Hypertension  Stable . Permissive hypertension (OK if < 220/120) but gradually normalize in 2-3 days . Long-term BP goal normotensive  Other Stroke Risk Factors  Advanced age  Overweight, Body mass index is 29.86 kg/m., recommend weight loss, diet and exercise as appropriate   Other Active Problems  Baseline Alzheimer's dementia - on aricept - 09/2000 - behavioral health admission depression, dementia  CKD IIIa Cre 1.15  Hospital day # 2  08-18-2005 PA-C [facility] [phone]  ATTENDING NOTE: I reviewed above note and agree with the assessment and plan. Pt was seen and examined.   Patient sitting in chair, not in distress.  No acute event overnight.  Neuro stable.  Our NP [phone] has discussed with patient son who agree to restart anticoagulation for stroke prevention.  Will recommend Eliquis to start in 1 week around 11/21/2019.  At the meantime continue aspirin 325.  Case  discussed with Dr. 01/21/2020. Neurology will sign off. Please call with questions. Pt will follow up with stroke clinic NP at [facility] in about 4 weeks. Thanks for the consult.   [facility], MD PhD Stroke Neurology 11/15/2019 7:28 PM     To contact Stroke Continuity provider, please refer to 01/15/2020. After hours, contact General Neurology

## 2019-11-15 NOTE — TOC Initial Note (Addendum)
Transition of Care [facility]) - Initial/Assessment Note    Patient Details  Name: Jasmine Woodward MRN: [id number] Date of Birth: September 23, 1927  Transition of Care [facility]) CM/SW Contact:    Sable Feil, LCSW Phone Number: 11/15/2019, 12:00 PM  Clinical Narrative:  Talked with son, Dayton Scrape ([phone]) regarding his mother's discharge disposition, as PT/OT recommending SNF.  Mr. Wynetta Emery confirmed that his mother is from [place] and has been there since July 2020. Prior to that patient was in [place] [facility] for 5 years. PT/OT recommendation discussed and son agreeable. He reported that he is waiting to hear from the doctor to receive an update on his mother. Son provided CSW with name and phone number of ALF Social Engineering geologist. Son also supplied CSW with his cell number: [phone].   11:40 am: Call made to [place], Gay with [facility] [phone]) and message left regarding patient and recommendation of ST rehab. .   12:12 pm: Call made to [employer] [phone]) staff person with [facility] and provided update. Informed Ms. Wiser that call was made to [place], [place] for SNF and message was left.   12:21 pm: Call received from [place], [facility] regarding patient. She indicated that they currently have 2 rehab beds and there is limited availability for weekend SNF admissions. She requested that over the weekend this number [phone] be called, and ask for the Health Care nurse. She will make contact with the Director of Nursing to determine if they have bed availability. When asked, Zack Seal was advised that patient does have a PASRR number.          Expected Discharge Plan: Skilled Nursing Facility Barriers to Discharge: Continued Medical Work up   Patient Goals and CMS Choice Patient states their goals for this hospitalization and ongoing recovery are:: Talked with son and he is agreeable to patient discharging to rehab at [facility] if this  is the recommendation CMS Medicare.gov Compare Post Acute Care list provided to:: Other (Comment Required)(Medicare.gov not discussed as patient from [place] [facility] and will likely discharge to [facility] SN[facility] for rehab) Choice offered to / list presented to : NA  Expected Discharge Plan and Services Expected Discharge Plan: [place] In-house Referral: Clinical Social Work                                            Prior Living Arrangements/Services   Lives with:: Self(ALF at [facility]) Patient language and need for interpreter reviewed:: No Do you feel safe going back to the place where you live?: No   Son agreeable to [facility] rehab  Need for Family Participation in Patient Care: Yes (Comment) Care giver support system in place?: No (comment)   Criminal Activity/Legal Involvement Pertinent to Current Situation/Hospitalization: No - Comment as needed  Activities of Daily Living Home Assistive Devices/Equipment: Walker (specify type)(front wheel) ADL Screening (condition at time of admission) Patient's cognitive ability adequate to safely complete daily activities?: No Is the patient deaf or have difficulty hearing?: No Does the patient have difficulty seeing, even when wearing glasses/contacts?: No Does the patient have difficulty concentrating, remembering, or making decisions?: Yes Patient able to express need for assistance with ADLs?: Yes Does the patient have difficulty dressing or bathing?: Yes Independently performs ADLs?: No Communication: Needs assistance Is this a change from  baseline?: Change from baseline, expected to last >3 days Dressing (OT): Needs assistance Is this a change from baseline?: Change from baseline, expected to last >3 days Grooming: Needs assistance Is this a change from baseline?: Change from baseline, expected to last >3 days Feeding: Needs assistance Is this a change from baseline?: Change from baseline, expected to last >3  days Bathing: Needs assistance Is this a change from baseline?: Change from baseline, expected to last >3 days Toileting: Needs assistance Is this a change from baseline?: Change from baseline, expected to last >3days In/Out Bed: Needs assistance Is this a change from baseline?: Change from baseline, expected to last >3 days Walks in Home: Needs assistance Is this a change from baseline?: Change from baseline, expected to last >3 days Does the patient have difficulty walking or climbing stairs?: Yes Weakness of Legs: Both Weakness of Arms/Hands: None  Permission Sought/Granted Permission sought to share information with : Family Supports(Did not talk with patient, as she is oriented to self only) Permission granted to share information with : No(Did not talk with patient due to orientation status. Spoke with son)              Emotional Assessment Appearance:: Other (Comment Required(Did not visit with patient. Talked with son by phone) Attitude/Demeanor/Rapport: Unable to Assess Affect (typically observed): Unable to Assess   Alcohol / Substance Use: Tobacco Use, Alcohol Use, Illicit Drugs(Per H&P, patient has never smoked, does not currently drink alcohol and does not use illicit drugs) Psych Involvement: No (comment)  Admission diagnosis:  TIA (transient ischemic attack) [G45.9] Stroke [facility]) [I63.9] Acute focal neurological deficit [R29.818] Patient Active Problem List   Diagnosis Date Noted  . Acute focal neurological deficit 11/13/2019  . Stroke (HCC) 11/13/2019  . Hypertension   . Atrial fibrillation (HCC)   . Alzheimer disease (HCC)   . Mild renal insufficiency    PCP:  Georgianne Fick, MD Pharmacy:   [facility] [place], [place] - [place] [address] [place] [address] Phone: [phone] Fax: [phone]     Social Determinants of Health (SDOH) Interventions    Readmission Risk Interventions No flowsheet  data found.

## 2019-11-15 NOTE — Care Management Important Message (Signed)
Important Message  Patient Details  Name: Jasmine Woodward MRN: [id number] Date of Birth: 1927/10/08   Medicare Important Message Given:        Dorena Bodo 11/15/2019, 1:26 PM

## 2019-11-15 NOTE — Progress Notes (Signed)
Physical Therapy Treatment Patient Details Name: Jasmine Woodward MRN: [id number] DOB: November 12, 1927 Today's Date: 11/15/2019    History of Present Illness Jasmine Woodward is an 86 y.o. female from [facility] presenting with L sided weakness. 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 PMH includes Alzheimer dementia, atrial fibrillation (not on anticoagulation) and HTN.    PT Comments    Pt was alert and generally easy to direct and keep on task.  Emphasis on transition to EOB, transfers to/from [facility] to chair.   Follow Up Recommendations  SNF;Supervision/Assistance - 24 hour     Equipment Recommendations  None recommended by PT    Recommendations for Other Services       Precautions / Restrictions Precautions Precautions: Fall    Mobility  Bed Mobility Overal bed mobility: Needs Assistance Bed Mobility: Supine to Sit     Supine to sit: Mod assist;+2 for physical assistance     General bed mobility comments: v/t cues for direction, assist to come up via R elbow.  Transfers Overall transfer level: Needs assistance   Transfers: Sit to/from Stand;Stand Pivot Transfers Sit to Stand: Min assist;Mod assist Stand pivot transfers: Min assist;Mod assist       General transfer comment: assist to come forward and up.  face to face pivot to the [facility], then further pivoting from [facility] over to the recliner.  Ambulation/Gait             General Gait Details: face to face pivot/stepping approx 4 feet over to the chair   Stairs             Wheelchair Mobility    Modified Rankin (Stroke Patients Only) Modified Rankin (Stroke Patients Only) Modified Rankin: Moderately severe disability     Balance Overall balance assessment: Needs assistance   Sitting balance-Leahy Scale: Fair     Standing balance support: Bilateral upper extremity supported Standing balance-Leahy Scale: Poor Standing balance comment: Reliant on BUE  and external support                             Cognition Arousal/Alertness: Awake/alert Behavior During Therapy: Restless Overall Cognitive Status: History of cognitive impairments - at baseline                         Following Commands: Follows one step commands inconsistently              Exercises      General Comments        Pertinent Vitals/Pain Pain Assessment: Faces Faces Pain Scale: No hurt    Home Living                      Prior Function            PT Goals (current goals can now be found in the care plan section) Acute Rehab PT Goals Patient Stated Goal: pt unable to participate PT Goal Formulation: Patient unable to participate in goal setting Time For Goal Achievement: 11/27/19 Potential to Achieve Goals: Fair Progress towards PT goals: Progressing toward goals    Frequency    Min 3X/week      PT Plan Current plan remains appropriate    Co-evaluation              AM-PAC PT "6 Clicks" Mobility   Outcome Measure  Help needed turning  from your back to your side while in a flat bed without using bedrails?: A Lot Help needed moving from lying on your back to sitting on the side of a flat bed without using bedrails?: A Lot Help needed moving to and from a bed to a chair (including a wheelchair)?: A Lot Help needed standing up from a chair using your arms (e.g., wheelchair or bedside chair)?: A Lot Help needed to walk in hospital room?: A Lot Help needed climbing 3-5 steps with a railing? : Total 6 Click Score: 11    End of Session   Activity Tolerance: Patient tolerated treatment well Patient left: in chair;with call bell/phone within reach;with chair alarm set Nurse Communication: Mobility status PT Visit Diagnosis: Unsteadiness on feet (R26.81);Muscle weakness (generalized) (M62.81);Difficulty in walking, not elsewhere classified (R26.2)     Time: 1537-1600 PT Time Calculation (min) (ACUTE  ONLY): 23 min  Charges:  $Therapeutic Activity: 23-37 mins                     11/15/2019  Ginger Carne., PT Acute Rehabilitation Services [phone]  (pager) [phone]  (office)   Tessie Fass Elgin Carn 11/15/2019, 5:56 PM

## 2019-11-16 DIAGNOSIS — G301 Alzheimer's disease with late onset: Secondary | ICD-10-CM | POA: Diagnosis not present

## 2019-11-16 DIAGNOSIS — F0280 Dementia in other diseases classified elsewhere without behavioral disturbance: Secondary | ICD-10-CM | POA: Diagnosis not present

## 2019-11-16 DIAGNOSIS — I480 Paroxysmal atrial fibrillation: Secondary | ICD-10-CM | POA: Diagnosis not present

## 2019-11-16 DIAGNOSIS — I63431 Cerebral infarction due to embolism of right posterior cerebral artery: Secondary | ICD-10-CM | POA: Diagnosis not present

## 2019-11-16 DIAGNOSIS — G309 Alzheimer's disease, unspecified: Secondary | ICD-10-CM | POA: Diagnosis not present

## 2019-11-16 DIAGNOSIS — I63531 Cerebral infarction due to unspecified occlusion or stenosis of right posterior cerebral artery: Secondary | ICD-10-CM | POA: Diagnosis not present

## 2019-11-16 DIAGNOSIS — N289 Disorder of kidney and ureter, unspecified: Secondary | ICD-10-CM | POA: Diagnosis not present

## 2019-11-16 DIAGNOSIS — I4811 Longstanding persistent atrial fibrillation: Secondary | ICD-10-CM | POA: Diagnosis not present

## 2019-11-16 DIAGNOSIS — E785 Hyperlipidemia, unspecified: Secondary | ICD-10-CM | POA: Diagnosis not present

## 2019-11-16 DIAGNOSIS — I639 Cerebral infarction, unspecified: Secondary | ICD-10-CM | POA: Diagnosis not present

## 2019-11-16 DIAGNOSIS — M6281 Muscle weakness (generalized): Secondary | ICD-10-CM | POA: Diagnosis not present

## 2019-11-16 DIAGNOSIS — R2681 Unsteadiness on feet: Secondary | ICD-10-CM | POA: Diagnosis not present

## 2019-11-16 DIAGNOSIS — R4789 Other speech disturbances: Secondary | ICD-10-CM | POA: Diagnosis not present

## 2019-11-16 DIAGNOSIS — L89622 Pressure ulcer of left heel, stage 2: Secondary | ICD-10-CM | POA: Diagnosis not present

## 2019-11-16 DIAGNOSIS — I69318 Other symptoms and signs involving cognitive functions following cerebral infarction: Secondary | ICD-10-CM | POA: Diagnosis not present

## 2019-11-16 DIAGNOSIS — I10 Essential (primary) hypertension: Secondary | ICD-10-CM | POA: Diagnosis not present

## 2019-11-16 MED ORDER — ATORVASTATIN CALCIUM 20 MG PO TABS: 20.0000 mg | ORAL_TABLET | Freq: Every day | ORAL | Status: DC

## 2019-11-16 MED ORDER — ASPIRIN 325 MG PO TABS: 325.0000 mg | ORAL_TABLET | Freq: Every day | ORAL | Status: DC

## 2019-11-16 MED ORDER — APIXABAN 5 MG PO TABS
5.0000 mg | ORAL_TABLET | Freq: Two times a day (BID) | ORAL | 0 refills | Status: DC
Start: 2019-11-21 — End: 2019-11-21

## 2019-11-16 NOTE — Discharge Summary (Addendum)
Physician Discharge Summary  Jasmine Woodward:[id number] DOB: June 07, 1928 DOA: 11/12/2019  PCP: Georgianne Fick, MD  Admit date: 11/12/2019 Discharge date: 11/16/2019  Admitted From: Home Disposition:  SNF  Discharge Condition:Stable CODE STATUS:DNR Diet recommendation: Heart Healthy   Brief/Interim Summary:  Patient is a 86 year old female with medical history of Alzheimer's dementia, hypertension, atrial fibrillation not on anticoagulant presented to the emerge department with complaints of left-sided weakness, slurred speech. On presentation, she was hemodynamically stable. Stroke was suspected.Patient is a 86 year old female with medical history of Alzheimer's dementia, hypertension, atrial fibrillation not on anticoagulant presented to the emergency  department with complaints of left-sided weakness, slurred speech. On presentation, she was hemodynamically stable. Stroke was suspected.MRI showed acute right PCA territory infarct with petechial hemorrhage. Neurology was consulted and following.  Her stroke was most likely secondary to discontinuation of anticoagulation for paroxysmal A. fib.  Stroke work-up completed.  Physical therapy recommended skilled nursing facility on discharge.  Recommended to start on anticoagulation for Eliquis.  She is hemodynamically stable for discharge today.  Following  problems were addressed during her hospitalization:  Acute nonhemorrhagic stroke: Presented with left-sided weakness, slurred speech. MRI showed acute right PCA territory infarct with petechial hemorrhage. Neurology has been consulted. Currently on aspirin 325 mg .  PT/OT/speech consulted. CTA head and neck showed no large vessel occlusion, 50% stenosis in proximal ICA. Echocardiogram done on 10/24/2019 showed left ventricular ejection fraction of 66 5%, grade 1 diastolic dysfunction, no obvious source for emboli LDL of 68.  Hemoglobin A1c of 5.3. On lipitor 20 mg daily. Stop aspirin  after 11/20/2019.  Start Eliquis 5 mg twice a day from 11/21/2019.  Paroximal A. fib: On sinus rhythm at present.She was pradaxa  before and it was discontinued by her PCP last year . CHADS-VASc at least 60 (age x2, gender, HTN) Currently rate is controlled. Start Eliquis as above.  Dementia: Assisted living facility resident. At baseline, she is able to feed herself, ambulates with help of walker but disoriented.  Recognizes her son and daughter.  Continue supportive care.  Currently she is oriented to self only.  Hypertension: Continue her home medications.    Discharge Diagnoses:  Principal Problem:   Acute focal neurological deficit Active Problems:   Hypertension   Atrial fibrillation (HCC)   Alzheimer disease (HCC)   Mild renal insufficiency   Stroke [facility])    Discharge Instructions  Discharge Instructions    Ambulatory referral to Neurology   Complete by: As directed    Follow up with stroke clinic NP (Jessica Vanschaick or Darrol Angel, if both not available, consider Manson Allan, or [facility]) at [facility] in about 4 weeks. Thanks.   Diet - low sodium heart healthy   Complete by: As directed    Discharge instructions   Complete by: As directed    1)Please take prescribed medications as instructed.Start Eliquis 5 mg BID from 11/21/19 and stop aspirin after 11/20/19. 2)Follow up with [facility] neurology in 4 weeks.  Name and number of the provider group has been attached.   Increase activity slowly   Complete by: As directed      Allergies as of 11/16/2019      Reactions   Bee Venom    Bee Stings.      Medication List    TAKE these medications   apixaban 5 MG Tabs tablet Commonly known as: ELIQUIS Take 1 tablet (5 mg total) by mouth 2 (two) times daily. Start taking on: November 21, 2019  aspirin 325 MG tablet Take 1 tablet (325 mg total) by mouth daily. Stop after 11/20/19 What changed: additional instructions   atorvastatin 20 MG tablet Commonly known as:  LIPITOR Take 1 tablet (20 mg total) by mouth daily. Start taking on: November 17, 2019   bimatoprost 0.01 % Soln Commonly known as: LUMIGAN Place 1 drop into both eyes at bedtime.   CALCIUM 600 + D PO Take 1 tablet by mouth in the morning and at bedtime.   CENTRUM SILVER PO Take 1 tablet by mouth daily. 4101mcg-300mcg.   donepezil 5 MG tablet Commonly known as: ARICEPT Take 5 mg by mouth at bedtime.   hydrochlorothiazide 25 MG tablet Commonly known as: HYDRODIURIL Take 25 mg by mouth daily.   indapamide 2.5 MG tablet Commonly known as: LOZOL Take 2.5 mg by mouth every morning.   Metoprolol Succinate 50 MG Cs24 Take 50 mg by mouth daily. Hold if Systolic is < 95 or if pulse is less than 60   triamcinolone cream 0.1 % Commonly known as: KENALOG Apply 1 application topically 2 (two) times daily as needed.      Follow-up Information    [facility]. Schedule an appointment as soon as possible for a visit in 4 week(s).   Specialty: Neurology Contact information: [address] [place] [address] [phone]         Allergies  Allergen Reactions  . Bee Venom     Bee Stings.    Consultations:  Neurology   Procedures/Studies: CT ANGIO HEAD W OR WO CONTRAST  Result Date: 11/15/2019 CLINICAL DATA:  Follow-up examination for stroke. EXAM: CT ANGIOGRAPHY HEAD AND NECK TECHNIQUE: Multidetector CT imaging of the head and neck was performed using the standard protocol during bolus administration of intravenous contrast. Multiplanar CT image reconstructions and MIPs were obtained to evaluate the vascular anatomy. Carotid stenosis measurements (when applicable) are obtained utilizing NASCET criteria, using the distal internal carotid diameter as the denominator. CONTRAST:  75mL OMNIPAQUE IOHEXOL 350 MG/ML SOLN COMPARISON:  Prior MRI from 11/13/2019. FINDINGS: CT HEAD FINDINGS Brain: Fairly sizable evolving right PCA territory infarct  again seen, stable in size and distribution as compared to previous MRI. Mild patchy involvement of the lateral right thalamus noted. Few scattered areas of mild hyperdensity within the evolving cytotoxic edema consistent with petechial hemorrhage, also seen on prior MRI. No frank hemorrhagic transformation. Localized edema without significant regional mass effect. No other new large vessel territory infarct. No other acute intracranial hemorrhage. Underlying atrophy with chronic small vessel ischemic disease with chronic by parietal infarcts noted. No mass lesion or midline shift. No hydrocephalus or extra-axial fluid collection. Vascular: No hyperdense vessel. Scattered vascular calcifications noted within the carotid siphons. Skull: Scalp soft tissues and calvarium within normal limits. Sinuses: Chronic right sphenoid sinusitis noted. Paranasal sinuses are otherwise largely clear. No mastoid effusion. Orbits: Globes and orbital soft tissues demonstrate no acute finding. Review of the MIP images confirms the above findings CTA NECK FINDINGS Aortic arch: Visualized aortic arch of normal caliber with normal 3 vessel morphology. Moderate atheromatous change about the visualized arch and origin of the great vessels without hemodynamically significant stenosis. Visualized subclavian arteries widely patent. Right carotid system: Right common carotid artery widely patent from its origin to the bifurcation without stenosis. Minimal atheromatous change about the right bifurcation without stenosis. Scattered eccentric calcified plaque noted just distally within the proximal right ICA with associated short-segment stenosis of up to 50% by NASCET criteria (series  11, image 195). Right ICA otherwise widely patent distally to the skull base without stenosis or other abnormality. Left carotid system: Left common carotid artery patent from its origin to the bifurcation without stenosis. Mild atheromatous change about the left  bifurcation without stenosis. Left ICA widely patent distally to the skull base without stenosis, dissection or occlusion. Vertebral arteries: Both vertebral arteries arise from the subclavian arteries. Left vertebral artery slightly dominant. Focal plaque at the origin of the left vertebral artery with no more than mild stenosis. Vertebral arteries otherwise widely patent within the neck without stenosis, dissection or occlusion. Skeleton: No acute osseous abnormality. No discrete or worrisome osseous lesions. Moderate cervical spondylosis noted at C3-4 through C6-7. Patient is edentulous. Other neck: No other acute soft tissue abnormality within the neck. No mass lesion or adenopathy. Few scattered subcentimeter thyroid nodules noted, largest of which measures 6 mm, felt to be of no clinical significance given size and patient's advanced age. No follow-up imaging recommended regarding these lesions. Upper chest: 8 mm focus of ground-glass opacity noted within the right upper lobe, which could reflect a small focus of atelectasis versus pneumonitis (series 11, image 350). Layering secretions/debris noted within the upper esophageal lumen. Small layering left pleural effusion partially visualized. Visualized upper chest demonstrates no other acute finding. Review of the MIP images confirms the above findings CTA HEAD FINDINGS Anterior circulation: Petrous segments widely patent bilaterally. Atheromatous change throughout the carotid siphons with associated mild to moderate multifocal stenosis (estimated 30-50%), slightly worse on the right. A1 segments widely patent. Normal anterior communicating artery complex. Anterior cerebral arteries widely patent to their distal aspects. No M1 stenosis or occlusion. Normal MCA bifurcations. Distal MCA branches well perfused and symmetric. Posterior circulation: Both vertebral arteries widely patent to the vertebrobasilar junction without stenosis. Neither PICA well visualized.  Basilar widely patent to its distal aspect without stenosis. Superior cerebral arteries patent bilaterally. Both PCAs primarily supplied via the basilar. Both PCAs well perfused to their distal aspects without appreciable stenosis. Venous sinuses: Patent allowing for timing the contrast bolus. Anatomic variants: None significant. Review of the MIP images confirms the above findings IMPRESSION: CT HEAD IMPRESSION: 1. Continued interval evolution of right PCA territory infarct, stable in size and distribution as compared to previous MRI. Associated petechial hemorrhage without hemorrhagic transformation, also similar. 2. No other new acute intracranial abnormality. 3. Advanced cerebral atrophy with chronic small vessel ischemic disease, stable. CTA HEAD AND NECK IMPRESSION: 1. Negative CTA for large vessel occlusion. 2. 50% short-segment atheromatous stenosis involving the proximal right ICA. 3. Atheromatous plaque involving the carotid siphons with associated mild to moderate diffuse narrowing. 4. No other hemodynamically significant or correctable stenosis about the major arterial vasculature of the head and neck. 5. Subcentimeter focus of ground-glass opacity within the right upper lobe as above, which could reflect a small focus of pneumonitis versus atelectasis. 6. Small layering left pleural effusion. Electronically Signed   By: Rise Mu M.D.   On: 11/15/2019 01:00   CT ANGIO NECK W OR WO CONTRAST  Result Date: 11/15/2019 CLINICAL DATA:  Follow-up examination for stroke. EXAM: CT ANGIOGRAPHY HEAD AND NECK TECHNIQUE: Multidetector CT imaging of the head and neck was performed using the standard protocol during bolus administration of intravenous contrast. Multiplanar CT image reconstructions and MIPs were obtained to evaluate the vascular anatomy. Carotid stenosis measurements (when applicable) are obtained utilizing NASCET criteria, using the distal internal carotid diameter as the denominator.  CONTRAST:  75mL OMNIPAQUE IOHEXOL 350 MG/ML  SOLN COMPARISON:  Prior MRI from 11/13/2019. FINDINGS: CT HEAD FINDINGS Brain: Fairly sizable evolving right PCA territory infarct again seen, stable in size and distribution as compared to previous MRI. Mild patchy involvement of the lateral right thalamus noted. Few scattered areas of mild hyperdensity within the evolving cytotoxic edema consistent with petechial hemorrhage, also seen on prior MRI. No frank hemorrhagic transformation. Localized edema without significant regional mass effect. No other new large vessel territory infarct. No other acute intracranial hemorrhage. Underlying atrophy with chronic small vessel ischemic disease with chronic by parietal infarcts noted. No mass lesion or midline shift. No hydrocephalus or extra-axial fluid collection. Vascular: No hyperdense vessel. Scattered vascular calcifications noted within the carotid siphons. Skull: Scalp soft tissues and calvarium within normal limits. Sinuses: Chronic right sphenoid sinusitis noted. Paranasal sinuses are otherwise largely clear. No mastoid effusion. Orbits: Globes and orbital soft tissues demonstrate no acute finding. Review of the MIP images confirms the above findings CTA NECK FINDINGS Aortic arch: Visualized aortic arch of normal caliber with normal 3 vessel morphology. Moderate atheromatous change about the visualized arch and origin of the great vessels without hemodynamically significant stenosis. Visualized subclavian arteries widely patent. Right carotid system: Right common carotid artery widely patent from its origin to the bifurcation without stenosis. Minimal atheromatous change about the right bifurcation without stenosis. Scattered eccentric calcified plaque noted just distally within the proximal right ICA with associated short-segment stenosis of up to 50% by NASCET criteria (series 11, image 195). Right ICA otherwise widely patent distally to the skull base without  stenosis or other abnormality. Left carotid system: Left common carotid artery patent from its origin to the bifurcation without stenosis. Mild atheromatous change about the left bifurcation without stenosis. Left ICA widely patent distally to the skull base without stenosis, dissection or occlusion. Vertebral arteries: Both vertebral arteries arise from the subclavian arteries. Left vertebral artery slightly dominant. Focal plaque at the origin of the left vertebral artery with no more than mild stenosis. Vertebral arteries otherwise widely patent within the neck without stenosis, dissection or occlusion. Skeleton: No acute osseous abnormality. No discrete or worrisome osseous lesions. Moderate cervical spondylosis noted at C3-4 through C6-7. Patient is edentulous. Other neck: No other acute soft tissue abnormality within the neck. No mass lesion or adenopathy. Few scattered subcentimeter thyroid nodules noted, largest of which measures 6 mm, felt to be of no clinical significance given size and patient's advanced age. No follow-up imaging recommended regarding these lesions. Upper chest: 8 mm focus of ground-glass opacity noted within the right upper lobe, which could reflect a small focus of atelectasis versus pneumonitis (series 11, image 350). Layering secretions/debris noted within the upper esophageal lumen. Small layering left pleural effusion partially visualized. Visualized upper chest demonstrates no other acute finding. Review of the MIP images confirms the above findings CTA HEAD FINDINGS Anterior circulation: Petrous segments widely patent bilaterally. Atheromatous change throughout the carotid siphons with associated mild to moderate multifocal stenosis (estimated 30-50%), slightly worse on the right. A1 segments widely patent. Normal anterior communicating artery complex. Anterior cerebral arteries widely patent to their distal aspects. No M1 stenosis or occlusion. Normal MCA bifurcations. Distal MCA  branches well perfused and symmetric. Posterior circulation: Both vertebral arteries widely patent to the vertebrobasilar junction without stenosis. Neither PICA well visualized. Basilar widely patent to its distal aspect without stenosis. Superior cerebral arteries patent bilaterally. Both PCAs primarily supplied via the basilar. Both PCAs well perfused to their distal aspects without appreciable stenosis. Venous sinuses: Patent  allowing for timing the contrast bolus. Anatomic variants: None significant. Review of the MIP images confirms the above findings IMPRESSION: CT HEAD IMPRESSION: 1. Continued interval evolution of right PCA territory infarct, stable in size and distribution as compared to previous MRI. Associated petechial hemorrhage without hemorrhagic transformation, also similar. 2. No other new acute intracranial abnormality. 3. Advanced cerebral atrophy with chronic small vessel ischemic disease, stable. CTA HEAD AND NECK IMPRESSION: 1. Negative CTA for large vessel occlusion. 2. 50% short-segment atheromatous stenosis involving the proximal right ICA. 3. Atheromatous plaque involving the carotid siphons with associated mild to moderate diffuse narrowing. 4. No other hemodynamically significant or correctable stenosis about the major arterial vasculature of the head and neck. 5. Subcentimeter focus of ground-glass opacity within the right upper lobe as above, which could reflect a small focus of pneumonitis versus atelectasis. 6. Small layering left pleural effusion. Electronically Signed   By: Rise Mu M.D.   On: 11/15/2019 01:00   MR BRAIN WO CONTRAST  Result Date: 11/13/2019 CLINICAL DATA:  Stroke-like symptoms EXAM: MRI HEAD WITHOUT CONTRAST TECHNIQUE: Multiplanar, multiecho pulse sequences of the brain and surrounding structures were obtained without intravenous contrast. COMPARISON:  04/30/2014 FINDINGS: Brain: Confluent restricted diffusion in the right occipital and posteromedial  temporal lobes with small acute infarct at the right thalamus. Petechial hemorrhage is seen at the level of the cortical infarct. Advanced brain atrophy, especially at the temporal and parietal lobes. There has been prior infarct at the bilateral parietal lobes. No acute hemorrhage, hydrocephalus, or masslike finding. Vascular: Major flow voids are preserved Skull and upper cervical spine: No focal marrow lesion. Sinuses/Orbits: Bilateral cataract resection IMPRESSION: 1. Acute right PCA territory infarct with petechial hemorrhage. 2. Advanced brain atrophy correlating with history of Alzheimer's disease. Electronically Signed   By: Marnee Spring M.D.   On: 11/13/2019 05:55   ECHOCARDIOGRAM COMPLETE  Result Date: 10/24/2019    ECHOCARDIOGRAM REPORT   Patient Name:   Jasmine Woodward Willis Date of Exam: 10/24/2019 Medical Rec #:  [id number]       Height:       64.0 in Accession #:    [id number]      Weight:       141.2 lb Date of Birth:  [birth year]        BSA:          1.687 m Patient Age:    89 years        BP:           139/89 mmHg Patient Gender: F               HR:           67 bpm. Exam Location:  Outpatient Procedure: 2D Echo Indications:    pedal edema  History:        Patient has no prior history of Echocardiogram examinations.  Sonographer:    Delcie Roch RDCS Referring Phys: [id number] Georgianne Fick IMPRESSIONS  1. Left ventricular ejection fraction, by estimation, is 60 to 65%. The left ventricle has normal function. The left ventricle has no regional wall motion abnormalities. Left ventricular diastolic parameters are consistent with Grade I diastolic dysfunction (impaired relaxation).  2. Right ventricular systolic function is normal. The right ventricular size is normal. There is mildly elevated pulmonary artery systolic pressure. The estimated right ventricular systolic pressure is 39.7 mmHg.  3. Left atrial size was moderately dilated.  4. The mitral valve is normal in structure. Mild mitral valve  regurgitation. No evidence of mitral stenosis.  5. The aortic valve is normal in structure. Aortic valve regurgitation is not visualized. No aortic stenosis is present.  6. The inferior vena cava is normal in size with greater than 50% respiratory variability, suggesting right atrial pressure of 3 mmHg. FINDINGS  Left Ventricle: Left ventricular ejection fraction, by estimation, is 60 to 65%. The left ventricle has normal function. The left ventricle has no regional wall motion abnormalities. The left ventricular internal cavity size was normal in size. There is  no left ventricular hypertrophy. Left ventricular diastolic parameters are consistent with Grade I diastolic dysfunction (impaired relaxation). Right Ventricle: The right ventricular size is normal. No increase in right ventricular wall thickness. Right ventricular systolic function is normal. There is mildly elevated pulmonary artery systolic pressure. The tricuspid regurgitant velocity is 3.03  m/s, and with an assumed right atrial pressure of 3 mmHg, the estimated right ventricular systolic pressure is 54.0 mmHg. Left Atrium: Left atrial size was moderately dilated. Right Atrium: Right atrial size was normal in size. Pericardium: There is no evidence of pericardial effusion. Mitral Valve: The mitral valve is normal in structure. Normal mobility of the mitral valve leaflets. Mild mitral valve regurgitation. No evidence of mitral valve stenosis. Tricuspid Valve: The tricuspid valve is normal in structure. Tricuspid valve regurgitation is trivial. No evidence of tricuspid stenosis. Aortic Valve: The aortic valve is normal in structure. Aortic valve regurgitation is not visualized. No aortic stenosis is present. Pulmonic Valve: The pulmonic valve was normal in structure. Pulmonic valve regurgitation is trivial. No evidence of pulmonic stenosis. Aorta: The aortic root is normal in size and structure. Venous: The inferior vena cava is normal in size with  greater than 50% respiratory variability, suggesting right atrial pressure of 3 mmHg. IAS/Shunts: No atrial level shunt detected by color flow Doppler.  LEFT VENTRICLE PLAX 2D LVIDd:         4.40 cm  Diastology LVIDs:         2.90 cm  LV e' lateral:   10.00 cm/s LV PW:         1.00 cm  LV E/e' lateral: 9.5 LV IVS:        0.90 cm  LV e' medial:    6.96 cm/s LVOT diam:     2.10 cm  LV E/e' medial:  13.7 LV SV:         70 LV SV Index:   42 LVOT Area:     3.46 cm  IVC IVC diam: 2.10 cm LEFT ATRIUM             Index       RIGHT ATRIUM           Index LA diam:        4.00 cm 2.37 cm/m  RA Area:     15.80 cm LA Vol (A2C):   53.9 ml 31.94 ml/m RA Volume:   36.40 ml  21.57 ml/m LA Vol (A4C):   63.8 ml 37.81 ml/m LA Biplane Vol: 59.6 ml 35.32 ml/m  AORTIC VALVE LVOT Vmax:   67.90 cm/s LVOT Vmean:  48.400 cm/s LVOT VTI:    0.203 m  AORTA Ao Root diam: 2.40 cm MITRAL VALVE               TRICUSPID VALVE MV Area (PHT): 4.89 cm    TR Peak grad:   36.7 mmHg MV Decel Time: 155 msec    TR Vmax:  303.00 cm/s MV E velocity: 95.10 cm/s MV A velocity: 51.80 cm/s  SHUNTS MV E/A ratio:  1.84        Systemic VTI:  0.20 m                            Systemic Diam: 2.10 cm Donato SchultzMark Skains MD Electronically signed by Donato SchultzMark Skains MD Signature Date/Time: 10/24/2019/2:02:10 PM    Final        Subjective: Patient seen and examined at the bedside this morning.  Hemodynamically stable for discharge.  Discharge Exam: Vitals:   11/16/19 0351 11/16/19 0832  BP: (!) 144/60 (!) 153/65  Pulse: 83 90  Resp: 18 18  Temp: 98.3 F (36.8 C) 98.7 F (37.1 C)  SpO2: 99% 99%   Vitals:   11/15/19 1936 11/15/19 2325 11/16/19 0351 11/16/19 0832  BP: (!) 132/46 (!) 159/53 (!) 144/60 (!) 153/65  Pulse: 80 85 83 90  Resp: 18 18 18 18   Temp: 98.5 F (36.9 C) 98.7 F (37.1 C) 98.3 F (36.8 C) 98.7 F (37.1 C)  TempSrc: Oral Oral Oral Oral  SpO2: 97% 97% 99% 99%  Weight:      Height:        General: Pt is alert, awake, not in  acute distress Cardiovascular: RRR, S1/S2 +, no rubs, no gallops Respiratory: CTA bilaterally, no wheezing, no rhonchi Abdominal: Soft, NT, ND, bowel sounds + Extremities: no edema, no cyanosis    The results of significant diagnostics from this hospitalization (including imaging, microbiology, ancillary and laboratory) are listed below for reference.     Microbiology: Recent Results (from the past 240 hour(s))  Urine culture     Status: Abnormal   Collection Time: 11/12/19 10:55 PM   Specimen: Urine, Catheterized  Result Value Ref Range Status   Specimen Description URINE, CATHETERIZED  Final   Special Requests NONE  Final   Culture (A)  Final    <10,000 COLONIES/mL INSIGNIFICANT GROWTH Performed at [facility], [address] [place], [place] [address]    Report Status 11/13/2019 FINAL  Final  SARS CORONAVIRUS 2 (TAT 6-24 HRS) Nasopharyngeal Nasopharyngeal Swab     Status: None   Collection Time: 11/13/19  4:04 AM   Specimen: Nasopharyngeal Swab  Result Value Ref Range Status   SARS Coronavirus 2 NEGATIVE NEGATIVE Final    Comment: (NOTE) SARS-CoV-2 target nucleic acids are NOT DETECTED. The SARS-CoV-2 RNA is generally detectable in upper and lower respiratory specimens during the acute phase of infection. Negative results do not preclude SARS-CoV-2 infection, do not rule out co-infections with other pathogens, and should not be used as the sole basis for treatment or other patient management decisions. Negative results must be combined with clinical observations, patient history, and epidemiological information. The expected result is Negative. Fact Sheet for Patients: HairSlick.nohttps://www.fda.gov/media/138098/download Fact Sheet for Healthcare Providers: quierodirigir.comhttps://www.fda.gov/media/138095/download This test is not yet approved or cleared by the [place] FDA and  has been authorized for detection and/or diagnosis of SARS-CoV-2 by FDA under an Emergency Use  Authorization (EUA). This EUA will remain  in effect (meaning this test can be used) for the duration of the COVID-19 declaration under Section 56 4(b)(1) of the Act, 21 U.S.C. section 360bbb-3(b)(1), unless the authorization is terminated or revoked sooner. Performed at [facility], [address] [place], [place] [address]   MRSA PCR Screening     Status: None   Collection Time:  11/14/19 12:06 AM   Specimen: Nasal Mucosa; Nasopharyngeal  Result Value Ref Range Status   MRSA by PCR NEGATIVE NEGATIVE Final    Comment:        The GeneXpert MRSA Assay (FDA approved for NASAL specimens only), is one component of a comprehensive MRSA colonization surveillance program. It is not intended to diagnose MRSA infection nor to guide or monitor treatment for MRSA infections. Performed at [facility], [address], [place], [place] [address]      Labs: BNP (last 3 results) No results for input(s): BNP in the last 8760 hours. Basic Metabolic Panel: Recent Labs  Lab 11/12/19 2259 11/14/19 0350  NA 140 136  K 4.4 4.3  CL 101 102  CO2 27 27  GLUCOSE 104* 132*  BUN 27* 19  CREATININE 1.18* 1.15*  CALCIUM 9.3 8.5*   Liver Function Tests: No results for input(s): AST, ALT, ALKPHOS, BILITOT, PROT, ALBUMIN in the last 168 hours. No results for input(s): LIPASE, AMYLASE in the last 168 hours. No results for input(s): AMMONIA in the last 168 hours. CBC: Recent Labs  Lab 11/12/19 2259  WBC 7.7  HGB 11.6*  HCT 35.5*  MCV 92.9  PLT 239   Cardiac Enzymes: No results for input(s): CKTOTAL, CKMB, CKMBINDEX, TROPONINI in the last 168 hours. BNP: Invalid input(s): POCBNP CBG: No results for input(s): GLUCAP in the last 168 hours. D-Dimer No results for input(s): DDIMER in the last 72 hours. Hgb A1c No results for input(s): HGBA1C in the last 72 hours. Lipid Profile No results for input(s): CHOL, HDL, LDLCALC, TRIG, CHOLHDL, LDLDIRECT in the last 72  hours. Thyroid function studies No results for input(s): TSH, T4TOTAL, T3FREE, THYROIDAB in the last 72 hours.  Invalid input(s): FREET3 Anemia work up No results for input(s): VITAMINB12, FOLATE, FERRITIN, TIBC, IRON, RETICCTPCT in the last 72 hours. Urinalysis    Component Value Date/Time   COLORURINE YELLOW 11/12/2019 2255   APPEARANCEUR CLEAR 11/12/2019 2255   LABSPEC 1.013 11/12/2019 2255   PHURINE 6.0 11/12/2019 2255   GLUCOSEU NEGATIVE 11/12/2019 2255   HGBUR NEGATIVE 11/12/2019 2255   BILIRUBINUR NEGATIVE 11/12/2019 2255   KETONESUR NEGATIVE 11/12/2019 2255   PROTEINUR NEGATIVE 11/12/2019 2255   UROBILINOGEN 0.2 04/30/2014 1345   NITRITE NEGATIVE 11/12/2019 2255   LEUKOCYTESUR NEGATIVE 11/12/2019 2255   Sepsis Labs Invalid input(s): PROCALCITONIN,  WBC,  LACTICIDVEN Microbiology Recent Results (from the past 240 hour(s))  Urine culture     Status: Abnormal   Collection Time: 11/12/19 10:55 PM   Specimen: Urine, Catheterized  Result Value Ref Range Status   Specimen Description URINE, CATHETERIZED  Final   Special Requests NONE  Final   Culture (A)  Final    <10,000 COLONIES/mL INSIGNIFICANT GROWTH Performed at [facility], [address] [place], [place] [address]    Report Status 11/13/2019 FINAL  Final  SARS CORONAVIRUS 2 (TAT 6-24 HRS) Nasopharyngeal Nasopharyngeal Swab     Status: None   Collection Time: 11/13/19  4:04 AM   Specimen: Nasopharyngeal Swab  Result Value Ref Range Status   SARS Coronavirus 2 NEGATIVE NEGATIVE Final    Comment: (NOTE) SARS-CoV-2 target nucleic acids are NOT DETECTED. The SARS-CoV-2 RNA is generally detectable in upper and lower respiratory specimens during the acute phase of infection. Negative results do not preclude SARS-CoV-2 infection, do not rule out co-infections with other pathogens, and should not be used as the sole basis for treatment or other patient management decisions.  Negative results must be combined  with clinical observations, patient history, and epidemiological information. The expected result is Negative. Fact Sheet for Patients: HairSlick.no Fact Sheet for Healthcare Providers: quierodirigir.com This test is not yet approved or cleared by the [place] FDA and  has been authorized for detection and/or diagnosis of SARS-CoV-2 by FDA under an Emergency Use Authorization (EUA). This EUA will remain  in effect (meaning this test can be used) for the duration of the COVID-19 declaration under Section 56 4(b)(1) of the Act, 21 U.S.C. section 360bbb-3(b)(1), unless the authorization is terminated or revoked sooner. Performed at [facility], [address], [place], [place] [address]   MRSA PCR Screening     Status: None   Collection Time: 11/14/19 12:06 AM   Specimen: Nasal Mucosa; Nasopharyngeal  Result Value Ref Range Status   MRSA by PCR NEGATIVE NEGATIVE Final    Comment:        The GeneXpert MRSA Assay (FDA approved for NASAL specimens only), is one component of a comprehensive MRSA colonization surveillance program. It is not intended to diagnose MRSA infection nor to guide or monitor treatment for MRSA infections. Performed at [facility], [address], [place], [place] [address]     Please note: You were cared for by a hospitalist during your hospital stay. Once you are discharged, your primary care physician will handle any further medical issues. Please note that NO REFILLS for any discharge medications will be authorized once you are discharged, as it is imperative that you return to your primary care physician (or establish a relationship with a primary care physician if you do not have one) for your post hospital discharge needs so that they can reassess your need for medications and monitor your lab values.    Time coordinating discharge: 40 minutes  SIGNED:   Burnadette Pop,  MD  [facility] 11/16/2019, 11:17 AM Pager [phone]  If 7PM-7AM, please contact night-coverage [email or web address] Password [email or web address]

## 2019-11-16 NOTE — Progress Notes (Signed)
Physical Therapy Treatment Patient Details Name: Jasmine Woodward MRN: [id number] DOB: May 23, 1928 Today's Date: 11/16/2019    History of Present Illness Jasmine Woodward is an 86 y.o. female from [facility] presenting with L sided weakness. 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 PMH includes Alzheimer dementia, atrial fibrillation (not on anticoagulation) and HTN.    PT Comments    Pt in bed upon arrival of PT, agreeable to PT session with focus on progressing functional mobility and ambulation. The pt was able to demo improvements in bed mobility, transfers, and short ambulation in room, but continues to require minA for safety due to deficits in strength, endurance, coordination of movements, and cognition. The pt will continue to benefit from skilled PT to address these deficits to improve safety and independence with mobility following d/c.    Follow Up Recommendations  SNF;Supervision/Assistance - 24 hour     Equipment Recommendations  None recommended by PT    Recommendations for Other Services       Precautions / Restrictions Precautions Precautions: Fall Restrictions Weight Bearing Restrictions: No    Mobility  Bed Mobility Overal bed mobility: Needs Assistance Bed Mobility: Supine to Sit     Supine to sit: Min assist Sit to supine: Min assist   General bed mobility comments: cues for reaching and LE movement, minA to raise trunk and steady. minA to reposition in bed following PT  Transfers Overall transfer level: Needs assistance Equipment used: Rolling walker (2 wheeled) Transfers: Sit to/from [employer] Sit to Stand: Min assist;Min guard Stand pivot transfers: Min guard       General transfer comment: minG for safety as pt is generally able to power up without assist, but frequently needs minA to position hands despite repeated verbal cues. completed pivot from bed to [facility] with minA for RW  management as pt stating "I don't see a BSC anywhere"  Ambulation/Gait Ambulation/Gait assistance: Min assist Gait Distance (Feet): 15 Feet Assistive device: Rolling walker (2 wheeled) Gait Pattern/deviations: Step-to pattern;Shuffle;Trunk flexed;Decreased stride length Gait velocity: decreased Gait velocity interpretation: <1.31 ft/sec, indicative of household ambulator General Gait Details: pt with short, shuffling steps, frequent need for assist/adjustment of RW. poor vision, but able to generally steer in room with cues. minA for stability   Stairs             Wheelchair Mobility    Modified Rankin (Stroke Patients Only) Modified Rankin (Stroke Patients Only) Pre-Morbid Rankin Score: Moderately severe disability Modified Rankin: Moderately severe disability     Balance Overall balance assessment: Needs assistance Sitting-balance support: Bilateral upper extremity supported;Feet supported Sitting balance-Leahy Scale: Fair     Standing balance support: Bilateral upper extremity supported Standing balance-Leahy Scale: Poor Standing balance comment: Reliant on BUE and external support                             Cognition Arousal/Alertness: Awake/alert Behavior During Therapy: Restless;WFL for tasks assessed/performed Overall Cognitive Status: History of cognitive impairments - at baseline Area of Impairment: Orientation;Attention;Memory;Following commands;Safety/judgement;Awareness;Problem solving                 Orientation Level: Disoriented to;Place;Time;Situation Current Attention Level: Focused Memory: Decreased recall of precautions;Decreased short-term memory Following Commands: Follows one step commands inconsistently Safety/Judgement: Decreased awareness of safety;Decreased awareness of deficits Awareness: Intellectual Problem Solving: Slow processing;Decreased initiation;Difficulty sequencing;Requires verbal cues;Requires tactile  cues General Comments: Pt very  perseverative and with poor sequencing throughout session. requires repeated cues for same tasks throughout session      Exercises      General Comments        Pertinent Vitals/Pain Pain Assessment: No/denies pain Pain Intervention(s): Limited activity within patient's tolerance;Monitored during session;Repositioned    Home Living                      Prior Function            PT Goals (current goals can now be found in the care plan section) Acute Rehab PT Goals Patient Stated Goal: pt unable to participate PT Goal Formulation: Patient unable to participate in goal setting Time For Goal Achievement: 11/27/19 Potential to Achieve Goals: Fair Progress towards PT goals: Progressing toward goals    Frequency    Min 3X/week      PT Plan Current plan remains appropriate    Co-evaluation              AM-PAC PT "6 Clicks" Mobility   Outcome Measure  Help needed turning from your back to your side while in a flat bed without using bedrails?: A Little Help needed moving from lying on your back to sitting on the side of a flat bed without using bedrails?: A Little Help needed moving to and from a bed to a chair (including a wheelchair)?: A Lot Help needed standing up from a chair using your arms (e.g., wheelchair or bedside chair)?: A Little Help needed to walk in hospital room?: A Little Help needed climbing 3-5 steps with a railing? : A Lot 6 Click Score: 16    End of Session Equipment Utilized During Treatment: Gait belt Activity Tolerance: Patient tolerated treatment well Patient left: in bed;with call bell/phone within reach;with bed alarm set Nurse Communication: Mobility status PT Visit Diagnosis: Unsteadiness on feet (R26.81);Muscle weakness (generalized) (M62.81);Difficulty in walking, not elsewhere classified (R26.2)     Time: 2458-0998 PT Time Calculation (min) (ACUTE ONLY): 25 min  Charges:  $Gait  Training: 23-37 mins                     Karma Ganja, PT, DPT   Acute Rehabilitation Department Pager #: [phone]   Otho Bellows 11/16/2019, 2:46 PM

## 2019-11-16 NOTE — TOC Progression Note (Signed)
Transition of Care [facility]) - Progression Note    Patient Details  Name: Jasmine Woodward MRN: [id number] Date of Birth: January 24, 1928  Transition of Care [facility]) CM/SW Contact  Nonda Lou, [place] Phone Number: 11/16/2019, 10:34 AM  Clinical Narrative:    Patient will DC to: [facility] Family notified: Casimiro Needle, son Transport by: Sharin Mons   Per MD patient ready for DC to [facility]. RN, patient, patient's family, and facility notified of DC. Discharge Summary and FL2 sent to facility. RN to call report prior to discharge [phone] ext [phone] or ext [phone]). DC packet on chart. Ambulance transport requested for patient.   CSW will sign off for now as social work intervention is no longer needed. Please consult [place] again if new needs arise.   Expected Discharge Plan: Skilled Nursing Facility Barriers to Discharge: Continued Medical Work up  Expected Discharge Plan and Services Expected Discharge Plan: Skilled Nursing Facility In-house Referral: Clinical Social Work                                             Social Determinants of Health (SDOH) Interventions    Readmission Risk Interventions No flowsheet data found.

## 2019-11-16 NOTE — Progress Notes (Signed)
Pt discharged to [facility].  PTAR called by Jake Shark, [place]  Report given to receiving nurse (K.D., RN) at [facility].  Midline and peripheral IV removed, telemetry discontinued.  AVS given with discharge instructions placed in discharge packet.

## 2019-11-16 NOTE — TOC Transition Note (Signed)
Transition of Care [facility]) - CM/SW Discharge Note   Patient Details  Name: Jasmine Woodward MRN: [id number] Date of Birth: December 15, 1927  Transition [facility]) CM/SW Contact:  Luiz Blare Phone Number: 11/16/2019, 11:43 AM   Clinical Narrative:    Patient will DC to: [facility] Family notified: Casimiro Needle, son Transport by: Sharin Mons   Per MD patient ready for DC to [facility]. RN, patient, patient's family, and facility notified of DC. Discharge Summary and FL2 sent to facility. RN to call report prior to discharge [phone] ext [phone] or ext [phone]). DC packet on chart. Ambulance transport requested for patient.   CSW will sign off for now as social work intervention is no longer needed. Please consult [place] again if new needs arise.   Final next level of care: Skilled Nursing Facility Barriers to Discharge: No Barriers Identified   Patient Goals and CMS Choice Patient states their goals for this hospitalization and ongoing recovery are:: Talked with son and he is agreeable to patient discharging to rehab at [facility] if this is the recommendation CMS Medicare.gov Compare Post Acute Care list provided to:: Patient Represenative (must comment) Choice offered to / list presented to : Adult Children  Discharge Placement              Patient chooses bed at: [facility] Patient to be transferred to facility by: PTAR Name of family member notified: Casimiro Needle Patient and family notified of of transfer: 11/16/19  Discharge Plan and Services In-house Referral: Clinical Social Work                                   Social Determinants of Health (SDOH) Interventions     Readmission Risk Interventions No flowsheet data found.

## 2019-11-18 ENCOUNTER — Non-Acute Institutional Stay (SKILLED_NURSING_FACILITY): Payer: Medicare Other | Admitting: Nurse Practitioner

## 2019-11-18 ENCOUNTER — Encounter: Payer: Self-pay | Admitting: Nurse Practitioner

## 2019-11-18 DIAGNOSIS — I63431 Cerebral infarction due to embolism of right posterior cerebral artery: Secondary | ICD-10-CM

## 2019-11-18 DIAGNOSIS — G301 Alzheimer's disease with late onset: Secondary | ICD-10-CM

## 2019-11-18 DIAGNOSIS — F0280 Dementia in other diseases classified elsewhere without behavioral disturbance: Secondary | ICD-10-CM | POA: Diagnosis not present

## 2019-11-18 DIAGNOSIS — I480 Paroxysmal atrial fibrillation: Secondary | ICD-10-CM

## 2019-11-18 DIAGNOSIS — I10 Essential (primary) hypertension: Secondary | ICD-10-CM

## 2019-11-18 NOTE — Assessment & Plan Note (Signed)
Slightly weakness left arm, leg, continue ASA then Eliquis, Atorvastatin. PT/OT/ST in SNF [facility]

## 2019-11-18 NOTE — Progress Notes (Signed)
Location:    [facility] Room Number: 1930/02/22 Place of Service:  SNF (31) Provider:  Chipper Oman NP  Georgianne Fick, MD  Patient Care Team: Georgianne Fick, MD as PCP - General (Internal Medicine)  Extended Emergency Contact Information Primary Emergency Contact: Marval Regal Address: [address]          [place], [place] [address] [place] of [place] Home Phone: [phone] Relation: Son  Code Status:  DNR Goals of care: Advanced Directive information Advanced Directives 11/13/2019  Does Patient Have a Medical Advance Directive? Yes  Type of Estate agent of [place];Living will;Out of facility DNR (pink MOST or yellow form)  Does patient want to make changes to medical advance directive? No - Patient declined  Copy of Healthcare Power of Attorney in Chart? No - copy requested  Would patient like information on creating a medical advance directive? -  Pre-existing out of facility DNR order (yellow form or pink MOST form) -     Chief Complaint  Patient presents with  . Acute Visit    Medication review    HPI:  Pt is a 86 y.o. female seen today for an acute visit for    Past Medical History:  Diagnosis Date  . Alzheimer disease (HCC)   . Atrial fibrillation (HCC)   . Hypertension    History reviewed. No pertinent surgical history.  Allergies  Allergen Reactions  . Bee Venom     Bee Stings.    Allergies as of 11/18/2019      Reactions   Bee Venom    Bee Stings.      Medication List       Accurate as of November 18, 2019  4:33 PM. If you have any questions, ask your nurse or doctor.        apixaban 5 MG Tabs tablet Commonly known as: ELIQUIS Take 1 tablet (5 mg total) by mouth 2 (two) times daily. Start taking on: November 21, 2019   aspirin 325 MG tablet Take 1 tablet (325 mg total) by mouth daily. Stop after 11/20/19   atorvastatin 20 MG tablet Commonly known as: LIPITOR Take 1 tablet (20 mg  total) by mouth daily.   bimatoprost 0.01 % Soln Commonly known as: LUMIGAN Place 1 drop into both eyes at bedtime.   CALCIUM 600 + D PO Take 1 tablet by mouth in the morning and at bedtime.   CENTRUM SILVER PO Take 1 tablet by mouth daily. 47mcg-300mcg.   donepezil 5 MG tablet Commonly known as: ARICEPT Take 5 mg by mouth at bedtime.   hydrochlorothiazide 25 MG tablet Commonly known as: HYDRODIURIL Take 25 mg by mouth daily.   indapamide 2.5 MG tablet Commonly known as: LOZOL Take 2.5 mg by mouth every morning.   Metoprolol Succinate 50 MG Cs24 Take 50 mg by mouth daily. Hold if Systolic is < 95 or if pulse is less than 60   triamcinolone cream 0.1 % Commonly known as: KENALOG Apply 1 application topically 2 (two) times daily as needed.       Review of Systems  Immunization History  Administered Date(s) Administered  . Influenza-Unspecified 04/02/2014, 04/16/2015, 04/01/2016, 04/14/2017  . Moderna SARS-COVID-2 Vaccination 06/17/2019, 07/15/2019  . Pneumococcal Conjugate-13 03/24/2014  . Pneumococcal Polysaccharide-23 02/26/2009  . Tdap 03/08/2012, 12/23/2018  . Zoster 09/03/2007   Pertinent  Health Maintenance Due  Topic Date Due  . URINE MICROALBUMIN  Never done  . DEXA SCAN  Never done  .  INFLUENZA VACCINE  01/12/2020  . PNA vac Low Risk Adult  Completed   No flowsheet data found. Functional Status Survey:    Vitals:   11/18/19 1630  BP: (!) 162/75  Pulse: 90  Resp: 16  Temp: (!) 97 F (36.1 C)  SpO2: 97%  Weight: 170 lb (77.1 kg)  Height: 5\' 8"  (1.727 m)   Body mass index is 25.85 kg/m. Physical Exam  Labs reviewed: Recent Labs    05/16/19 0000 11/12/19 2259 11/14/19 0350  NA 140 140 136  K 4.1 4.4 4.3  CL 102 101 102  CO2 31* 27 27  GLUCOSE  --  104* 132*  BUN 29* 27* 19  CREATININE 1.0 1.18* 1.15*  CALCIUM 9.4 9.3 8.5*   Recent Labs    05/16/19 0000  AST 19  ALT 15  ALKPHOS 86  ALBUMIN 3.8   Recent Labs     11/12/19 2259  WBC 7.7  HGB 11.6*  HCT 35.5*  MCV 92.9  PLT 239   No results found for: TSH Lab Results  Component Value Date   HGBA1C 5.3 11/13/2019   Lab Results  Component Value Date   CHOL 141 11/13/2019   HDL 64 11/13/2019   LDLCALC 68 11/13/2019   TRIG 46 11/13/2019   CHOLHDL 2.2 11/13/2019    Significant Diagnostic Results in last 30 days:  CT ANGIO HEAD W OR WO CONTRAST  Result Date: 11/15/2019 CLINICAL DATA:  Follow-up examination for stroke. EXAM: CT ANGIOGRAPHY HEAD AND NECK TECHNIQUE: Multidetector CT imaging of the head and neck was performed using the standard protocol during bolus administration of intravenous contrast. Multiplanar CT image reconstructions and MIPs were obtained to evaluate the vascular anatomy. Carotid stenosis measurements (when applicable) are obtained utilizing NASCET criteria, using the distal internal carotid diameter as the denominator. CONTRAST:  99mL OMNIPAQUE IOHEXOL 350 MG/ML SOLN COMPARISON:  Prior MRI from 11/13/2019. FINDINGS: CT HEAD FINDINGS Brain: Fairly sizable evolving right PCA territory infarct again seen, stable in size and distribution as compared to previous MRI. Mild patchy involvement of the lateral right thalamus noted. Few scattered areas of mild hyperdensity within the evolving cytotoxic edema consistent with petechial hemorrhage, also seen on prior MRI. No frank hemorrhagic transformation. Localized edema without significant regional mass effect. No other new large vessel territory infarct. No other acute intracranial hemorrhage. Underlying atrophy with chronic small vessel ischemic disease with chronic by parietal infarcts noted. No mass lesion or midline shift. No hydrocephalus or extra-axial fluid collection. Vascular: No hyperdense vessel. Scattered vascular calcifications noted within the carotid siphons. Skull: Scalp soft tissues and calvarium within normal limits. Sinuses: Chronic right sphenoid sinusitis noted. Paranasal  sinuses are otherwise largely clear. No mastoid effusion. Orbits: Globes and orbital soft tissues demonstrate no acute finding. Review of the MIP images confirms the above findings CTA NECK FINDINGS Aortic arch: Visualized aortic arch of normal caliber with normal 3 vessel morphology. Moderate atheromatous change about the visualized arch and origin of the great vessels without hemodynamically significant stenosis. Visualized subclavian arteries widely patent. Right carotid system: Right common carotid artery widely patent from its origin to the bifurcation without stenosis. Minimal atheromatous change about the right bifurcation without stenosis. Scattered eccentric calcified plaque noted just distally within the proximal right ICA with associated short-segment stenosis of up to 50% by NASCET criteria (series 11, image 195). Right ICA otherwise widely patent distally to the skull base without stenosis or other abnormality. Left carotid system: Left common carotid artery patent from  its origin to the bifurcation without stenosis. Mild atheromatous change about the left bifurcation without stenosis. Left ICA widely patent distally to the skull base without stenosis, dissection or occlusion. Vertebral arteries: Both vertebral arteries arise from the subclavian arteries. Left vertebral artery slightly dominant. Focal plaque at the origin of the left vertebral artery with no more than mild stenosis. Vertebral arteries otherwise widely patent within the neck without stenosis, dissection or occlusion. Skeleton: No acute osseous abnormality. No discrete or worrisome osseous lesions. Moderate cervical spondylosis noted at C3-4 through C6-7. Patient is edentulous. Other neck: No other acute soft tissue abnormality within the neck. No mass lesion or adenopathy. Few scattered subcentimeter thyroid nodules noted, largest of which measures 6 mm, felt to be of no clinical significance given size and patient's advanced age. No  follow-up imaging recommended regarding these lesions. Upper chest: 8 mm focus of ground-glass opacity noted within the right upper lobe, which could reflect a small focus of atelectasis versus pneumonitis (series 11, image 350). Layering secretions/debris noted within the upper esophageal lumen. Small layering left pleural effusion partially visualized. Visualized upper chest demonstrates no other acute finding. Review of the MIP images confirms the above findings CTA HEAD FINDINGS Anterior circulation: Petrous segments widely patent bilaterally. Atheromatous change throughout the carotid siphons with associated mild to moderate multifocal stenosis (estimated 30-50%), slightly worse on the right. A1 segments widely patent. Normal anterior communicating artery complex. Anterior cerebral arteries widely patent to their distal aspects. No M1 stenosis or occlusion. Normal MCA bifurcations. Distal MCA branches well perfused and symmetric. Posterior circulation: Both vertebral arteries widely patent to the vertebrobasilar junction without stenosis. Neither PICA well visualized. Basilar widely patent to its distal aspect without stenosis. Superior cerebral arteries patent bilaterally. Both PCAs primarily supplied via the basilar. Both PCAs well perfused to their distal aspects without appreciable stenosis. Venous sinuses: Patent allowing for timing the contrast bolus. Anatomic variants: None significant. Review of the MIP images confirms the above findings IMPRESSION: CT HEAD IMPRESSION: 1. Continued interval evolution of right PCA territory infarct, stable in size and distribution as compared to previous MRI. Associated petechial hemorrhage without hemorrhagic transformation, also similar. 2. No other new acute intracranial abnormality. 3. Advanced cerebral atrophy with chronic small vessel ischemic disease, stable. CTA HEAD AND NECK IMPRESSION: 1. Negative CTA for large vessel occlusion. 2. 50% short-segment  atheromatous stenosis involving the proximal right ICA. 3. Atheromatous plaque involving the carotid siphons with associated mild to moderate diffuse narrowing. 4. No other hemodynamically significant or correctable stenosis about the major arterial vasculature of the head and neck. 5. Subcentimeter focus of ground-glass opacity within the right upper lobe as above, which could reflect a small focus of pneumonitis versus atelectasis. 6. Small layering left pleural effusion. Electronically Signed   By: Rise Mu M.D.   On: 11/15/2019 01:00   CT ANGIO NECK W OR WO CONTRAST  Result Date: 11/15/2019 CLINICAL DATA:  Follow-up examination for stroke. EXAM: CT ANGIOGRAPHY HEAD AND NECK TECHNIQUE: Multidetector CT imaging of the head and neck was performed using the standard protocol during bolus administration of intravenous contrast. Multiplanar CT image reconstructions and MIPs were obtained to evaluate the vascular anatomy. Carotid stenosis measurements (when applicable) are obtained utilizing NASCET criteria, using the distal internal carotid diameter as the denominator. CONTRAST:  66mL OMNIPAQUE IOHEXOL 350 MG/ML SOLN COMPARISON:  Prior MRI from 11/13/2019. FINDINGS: CT HEAD FINDINGS Brain: Fairly sizable evolving right PCA territory infarct again seen, stable in size and distribution as compared  to previous MRI. Mild patchy involvement of the lateral right thalamus noted. Few scattered areas of mild hyperdensity within the evolving cytotoxic edema consistent with petechial hemorrhage, also seen on prior MRI. No frank hemorrhagic transformation. Localized edema without significant regional mass effect. No other new large vessel territory infarct. No other acute intracranial hemorrhage. Underlying atrophy with chronic small vessel ischemic disease with chronic by parietal infarcts noted. No mass lesion or midline shift. No hydrocephalus or extra-axial fluid collection. Vascular: No hyperdense vessel.  Scattered vascular calcifications noted within the carotid siphons. Skull: Scalp soft tissues and calvarium within normal limits. Sinuses: Chronic right sphenoid sinusitis noted. Paranasal sinuses are otherwise largely clear. No mastoid effusion. Orbits: Globes and orbital soft tissues demonstrate no acute finding. Review of the MIP images confirms the above findings CTA NECK FINDINGS Aortic arch: Visualized aortic arch of normal caliber with normal 3 vessel morphology. Moderate atheromatous change about the visualized arch and origin of the great vessels without hemodynamically significant stenosis. Visualized subclavian arteries widely patent. Right carotid system: Right common carotid artery widely patent from its origin to the bifurcation without stenosis. Minimal atheromatous change about the right bifurcation without stenosis. Scattered eccentric calcified plaque noted just distally within the proximal right ICA with associated short-segment stenosis of up to 50% by NASCET criteria (series 11, image 195). Right ICA otherwise widely patent distally to the skull base without stenosis or other abnormality. Left carotid system: Left common carotid artery patent from its origin to the bifurcation without stenosis. Mild atheromatous change about the left bifurcation without stenosis. Left ICA widely patent distally to the skull base without stenosis, dissection or occlusion. Vertebral arteries: Both vertebral arteries arise from the subclavian arteries. Left vertebral artery slightly dominant. Focal plaque at the origin of the left vertebral artery with no more than mild stenosis. Vertebral arteries otherwise widely patent within the neck without stenosis, dissection or occlusion. Skeleton: No acute osseous abnormality. No discrete or worrisome osseous lesions. Moderate cervical spondylosis noted at C3-4 through C6-7. Patient is edentulous. Other neck: No other acute soft tissue abnormality within the neck. No mass  lesion or adenopathy. Few scattered subcentimeter thyroid nodules noted, largest of which measures 6 mm, felt to be of no clinical significance given size and patient's advanced age. No follow-up imaging recommended regarding these lesions. Upper chest: 8 mm focus of ground-glass opacity noted within the right upper lobe, which could reflect a small focus of atelectasis versus pneumonitis (series 11, image 350). Layering secretions/debris noted within the upper esophageal lumen. Small layering left pleural effusion partially visualized. Visualized upper chest demonstrates no other acute finding. Review of the MIP images confirms the above findings CTA HEAD FINDINGS Anterior circulation: Petrous segments widely patent bilaterally. Atheromatous change throughout the carotid siphons with associated mild to moderate multifocal stenosis (estimated 30-50%), slightly worse on the right. A1 segments widely patent. Normal anterior communicating artery complex. Anterior cerebral arteries widely patent to their distal aspects. No M1 stenosis or occlusion. Normal MCA bifurcations. Distal MCA branches well perfused and symmetric. Posterior circulation: Both vertebral arteries widely patent to the vertebrobasilar junction without stenosis. Neither PICA well visualized. Basilar widely patent to its distal aspect without stenosis. Superior cerebral arteries patent bilaterally. Both PCAs primarily supplied via the basilar. Both PCAs well perfused to their distal aspects without appreciable stenosis. Venous sinuses: Patent allowing for timing the contrast bolus. Anatomic variants: None significant. Review of the MIP images confirms the above findings IMPRESSION: CT HEAD IMPRESSION: 1. Continued interval evolution of  right PCA territory infarct, stable in size and distribution as compared to previous MRI. Associated petechial hemorrhage without hemorrhagic transformation, also similar. 2. No other new acute intracranial abnormality.  3. Advanced cerebral atrophy with chronic small vessel ischemic disease, stable. CTA HEAD AND NECK IMPRESSION: 1. Negative CTA for large vessel occlusion. 2. 50% short-segment atheromatous stenosis involving the proximal right ICA. 3. Atheromatous plaque involving the carotid siphons with associated mild to moderate diffuse narrowing. 4. No other hemodynamically significant or correctable stenosis about the major arterial vasculature of the head and neck. 5. Subcentimeter focus of ground-glass opacity within the right upper lobe as above, which could reflect a small focus of pneumonitis versus atelectasis. 6. Small layering left pleural effusion. Electronically Signed   By: Jeannine Boga M.D.   On: 11/15/2019 01:00   MR BRAIN WO CONTRAST  Result Date: 11/13/2019 CLINICAL DATA:  Stroke-like symptoms EXAM: MRI HEAD WITHOUT CONTRAST TECHNIQUE: Multiplanar, multiecho pulse sequences of the brain and surrounding structures were obtained without intravenous contrast. COMPARISON:  04/30/2014 FINDINGS: Brain: Confluent restricted diffusion in the right occipital and posteromedial temporal lobes with small acute infarct at the right thalamus. Petechial hemorrhage is seen at the level of the cortical infarct. Advanced brain atrophy, especially at the temporal and parietal lobes. There has been prior infarct at the bilateral parietal lobes. No acute hemorrhage, hydrocephalus, or masslike finding. Vascular: Major flow voids are preserved Skull and upper cervical spine: No focal marrow lesion. Sinuses/Orbits: Bilateral cataract resection IMPRESSION: 1. Acute right PCA territory infarct with petechial hemorrhage. 2. Advanced brain atrophy correlating with history of Alzheimer's disease. Electronically Signed   By: Monte Fantasia M.D.   On: 11/13/2019 05:55   ECHOCARDIOGRAM COMPLETE  Result Date: 10/24/2019    ECHOCARDIOGRAM REPORT   Patient Name:   Jasmine Woodward Date of Exam: 10/24/2019 Medical Rec #:  [id number]        Height:       64.0 in Accession #:    [id number]      Weight:       141.2 lb Date of Birth:  August 03, 1927        BSA:          1.687 m Patient Age:    86 years        BP:           139/89 mmHg Patient Gender: F               HR:           67 bpm. Exam Location:  Outpatient Procedure: 2D Echo Indications:    pedal edema  History:        Patient has no prior history of Echocardiogram examinations.  Sonographer:    Johny Chess RDCS Referring Phys: [place]  1. Left ventricular ejection fraction, by estimation, is 60 to 65%. The left ventricle has normal function. The left ventricle has no regional wall motion abnormalities. Left ventricular diastolic parameters are consistent with Grade I diastolic dysfunction (impaired relaxation).  2. Right ventricular systolic function is normal. The right ventricular size is normal. There is mildly elevated pulmonary artery systolic pressure. The estimated right ventricular systolic pressure is 93.8 mmHg.  3. Left atrial size was moderately dilated.  4. The mitral valve is normal in structure. Mild mitral valve regurgitation. No evidence of mitral stenosis.  5. The aortic valve is normal in structure. Aortic valve regurgitation is not visualized. No aortic stenosis is present.  6.  The inferior vena cava is normal in size with greater than 50% respiratory variability, suggesting right atrial pressure of 3 mmHg. FINDINGS  Left Ventricle: Left ventricular ejection fraction, by estimation, is 60 to 65%. The left ventricle has normal function. The left ventricle has no regional wall motion abnormalities. The left ventricular internal cavity size was normal in size. There is  no left ventricular hypertrophy. Left ventricular diastolic parameters are consistent with Grade I diastolic dysfunction (impaired relaxation). Right Ventricle: The right ventricular size is normal. No increase in right ventricular wall thickness. Right ventricular systolic function  is normal. There is mildly elevated pulmonary artery systolic pressure. The tricuspid regurgitant velocity is 3.03  m/s, and with an assumed right atrial pressure of 3 mmHg, the estimated right ventricular systolic pressure is 39.7 mmHg. Left Atrium: Left atrial size was moderately dilated. Right Atrium: Right atrial size was normal in size. Pericardium: There is no evidence of pericardial effusion. Mitral Valve: The mitral valve is normal in structure. Normal mobility of the mitral valve leaflets. Mild mitral valve regurgitation. No evidence of mitral valve stenosis. Tricuspid Valve: The tricuspid valve is normal in structure. Tricuspid valve regurgitation is trivial. No evidence of tricuspid stenosis. Aortic Valve: The aortic valve is normal in structure. Aortic valve regurgitation is not visualized. No aortic stenosis is present. Pulmonic Valve: The pulmonic valve was normal in structure. Pulmonic valve regurgitation is trivial. No evidence of pulmonic stenosis. Aorta: The aortic root is normal in size and structure. Venous: The inferior vena cava is normal in size with greater than 50% respiratory variability, suggesting right atrial pressure of 3 mmHg. IAS/Shunts: No atrial level shunt detected by color flow Doppler.  LEFT VENTRICLE PLAX 2D LVIDd:         4.40 cm  Diastology LVIDs:         2.90 cm  LV e' lateral:   10.00 cm/s LV PW:         1.00 cm  LV E/e' lateral: 9.5 LV IVS:        0.90 cm  LV e' medial:    6.96 cm/s LVOT diam:     2.10 cm  LV E/e' medial:  13.7 LV SV:         70 LV SV Index:   42 LVOT Area:     3.46 cm  IVC IVC diam: 2.10 cm LEFT ATRIUM             Index       RIGHT ATRIUM           Index LA diam:        4.00 cm 2.37 cm/m  RA Area:     15.80 cm LA Vol (A2C):   53.9 ml 31.94 ml/m RA Volume:   36.40 ml  21.57 ml/m LA Vol (A4C):   63.8 ml 37.81 ml/m LA Biplane Vol: 59.6 ml 35.32 ml/m  AORTIC VALVE LVOT Vmax:   67.90 cm/s LVOT Vmean:  48.400 cm/s LVOT VTI:    0.203 m  AORTA Ao Root diam:  2.40 cm MITRAL VALVE               TRICUSPID VALVE MV Area (PHT): 4.89 cm    TR Peak grad:   36.7 mmHg MV Decel Time: 155 msec    TR Vmax:        303.00 cm/s MV E velocity: 95.10 cm/s MV A velocity: 51.80 cm/s  SHUNTS MV E/A ratio:  1.84  Systemic VTI:  0.20 m                            Systemic Diam: 2.10 cm Donato Schultz MD Electronically signed by Donato Schultz MD Signature Date/Time: 10/24/2019/2:02:10 PM    Final     Assessment/Plan There are no diagnoses linked to this encounter.   Family/ staff Communication:   Labs/tests ordered:

## 2019-11-18 NOTE — Progress Notes (Signed)
Location:   [facility] Room Number: 01/07/30 Place of Service:  SNF (31) Provider: Lennie Odor Iretha Kirley NP  [name], MD  Patient Care Team: [name], MD as PCP - General (Internal Medicine)  Extended Emergency Contact Information Primary Emergency Contact: [name] Address: [address]          [place], [place] [address] Johnnette Litter of [place] Phone: [phone] Relation: Son  Code Status:  DNR Goals of care: Advanced Directive information Advanced Directives 11/13/2019  Does Patient Have a Medical Advance Directive? Yes  Type of Paramedic of [place];Living will;Out of facility DNR (pink MOST or yellow form)  Does patient want to make changes to medical advance directive? No - Patient declined  Copy of [place] in Chart? No - copy requested  Would patient like information on creating a medical advance directive? -  Pre-existing out of facility DNR order (yellow form or pink MOST form) -     Chief Complaint  Patient presents with  . Acute Visit    Medication review    HPI:  Pt is a 86 y.o. female seen today for an acute visit for review her medications.   The patient was hospitalized 11/12/19-11/16/19 for left sided weakness, slurred speech, MRI showed acute right PCA territory infarct with petechial hemorrhage,  CTA head and neckshowed no large vessel occlusion, 50% stenosis in proximal ICA. Echocardiogram done on 10/24/2019 showed left ventricular ejection fraction of 66 5%, grade 1 diastolic dysfunction, no obvious source for embolineurology recommended to Eliquis 5mg  bid 11/21/19 after ASA 325mg  qd completed 11/20/19 on Atorvastatin.   Hx of Alzheimer's dementia, on Donepezil.  HTN/trace edema BLE, on HCTZ 25mg  qd, Metoprolol 50mg  qd.  AFib, heart rate is in control, continue Metoprolol, stop ASA 325mg  qd 11/20/19, start  Eliquis 5mg  bid 11/21/19   Past Medical History:  Diagnosis Date  .  Alzheimer disease ([place])   . Atrial fibrillation ([place])   . Hypertension    History reviewed. No pertinent surgical history.  Allergies  Allergen Reactions  . Bee Venom     Bee Stings.    Allergies as of 11/18/2019      Reactions   Bee Venom    Bee Stings.      Medication List       Accurate as of November 18, 2019 11:59 PM. If you have any questions, ask your nurse or doctor.        STOP taking these medications   indapamide 2.5 MG tablet Commonly known as: LOZOL Stopped by: [name], NP     TAKE these medications   apixaban 5 MG Tabs tablet Commonly known as: ELIQUIS Take 1 tablet (5 mg total) by mouth 2 (two) times daily. Start taking on: November 21, 2019   aspirin 325 MG tablet Take 1 tablet (325 mg total) by mouth daily. Stop after 11/20/19   atorvastatin 20 MG tablet Commonly known as: LIPITOR Take 1 tablet (20 mg total) by mouth daily.   bimatoprost 0.01 % Soln Commonly known as: LUMIGAN Place 1 drop into both eyes at bedtime.   CALCIUM 600 + D PO Take 1 tablet by mouth in the morning and at bedtime.   CENTRUM SILVER PO Take 1 tablet by mouth daily. 441mcg-300mcg.   donepezil 5 MG tablet Commonly known as: ARICEPT Take 5 mg by mouth at bedtime.   hydrochlorothiazide 25 MG tablet Commonly known as: HYDRODIURIL Take 25 mg by mouth daily.  Metoprolol Succinate 50 MG Cs24 Take 50 mg by mouth daily. Hold if Systolic is < 95 or if pulse is less than 60   triamcinolone cream 0.1 % Commonly known as: KENALOG Apply 1 application topically 2 (two) times daily as needed.       Review of Systems  Constitutional: Negative for activity change, appetite change, chills, diaphoresis, fatigue and fever.  HENT: Positive for hearing loss. Negative for congestion, trouble swallowing and voice change.   Respiratory: Negative for cough, shortness of breath and wheezing.   Cardiovascular: Positive for leg swelling. Negative for chest pain and palpitations.    Gastrointestinal: Positive for nausea. Negative for abdominal distention, abdominal pain, constipation, diarrhea and vomiting.  Genitourinary: Negative for difficulty urinating and dysuria.  Musculoskeletal: Positive for gait problem.  Neurological: Positive for speech difficulty and weakness. Negative for dizziness, tremors, facial asymmetry, light-headedness and headaches.       Dementia  Psychiatric/Behavioral: Negative for agitation, behavioral problems, hallucinations and sleep disturbance. The patient is not nervous/anxious.     Immunization History  Administered Date(s) Administered  . Influenza-Unspecified 04/02/2014, 04/16/2015, 04/01/2016, 04/14/2017  . Moderna SARS-COVID-2 Vaccination 06/17/2019, 07/15/2019  . Pneumococcal Conjugate-13 03/24/2014  . Pneumococcal Polysaccharide-23 02/26/2009  . Tdap 03/08/2012, 12/23/2018  . Zoster 09/03/2007   Pertinent  Health Maintenance Due  Topic Date Due  . URINE MICROALBUMIN  Never done  . DEXA SCAN  Never done  . INFLUENZA VACCINE  01/12/2020  . PNA vac Low Risk Adult  Completed   No flowsheet data found. Functional Status Survey:    Vitals:   11/18/19 1630  BP: (!) 162/75  Pulse: 90  Resp: 16  Temp: (!) 97 F (36.1 C)  SpO2: 97%  Weight: 170 lb (77.1 kg)  Height: 5\' 8"  (1.727 m)   Body mass index is 25.85 kg/m. Physical Exam Vitals and nursing note reviewed.  Constitutional:      General: She is not in acute distress.    Appearance: Normal appearance. She is not ill-appearing, toxic-appearing or diaphoretic.  HENT:     Head: Normocephalic and atraumatic.     Nose: Nose normal.     Mouth/Throat:     Mouth: Mucous membranes are moist.  Eyes:     Extraocular Movements: Extraocular movements intact.     Conjunctiva/sclera: Conjunctivae normal.     Pupils: Pupils are equal, round, and reactive to light.  Cardiovascular:     Rate and Rhythm: Normal rate and regular rhythm.     Heart sounds: No murmur.   Pulmonary:     Effort: Pulmonary effort is normal.     Breath sounds: No wheezing, rhonchi or rales.  Abdominal:     General: Bowel sounds are normal. There is no distension.     Palpations: Abdomen is soft.     Tenderness: There is no abdominal tenderness. There is no right CVA tenderness, left CVA tenderness, guarding or rebound.  Musculoskeletal:     Cervical back: Normal range of motion and neck supple.     Right lower leg: Edema present.     Left lower leg: Edema present.     Comments: Trace edema BLE  Skin:    General: Skin is warm and dry.  Neurological:     General: No focal deficit present.     Mental Status: She is alert. Mental status is at baseline.     Comments: Oriented to person  Psychiatric:        Mood and Affect: Mood normal.  Behavior: Behavior normal.     Labs reviewed: Recent Labs    05/16/19 0000 11/12/19 2259 11/14/19 0350  NA 140 140 136  K 4.1 4.4 4.3  CL 102 101 102  CO2 31* 27 27  GLUCOSE  --  104* 132*  BUN 29* 27* 19  CREATININE 1.0 1.18* 1.15*  CALCIUM 9.4 9.3 8.5*   Recent Labs    05/16/19 0000  AST 19  ALT 15  ALKPHOS 86  ALBUMIN 3.8   Recent Labs    11/12/19 2259  WBC 7.7  HGB 11.6*  HCT 35.5*  MCV 92.9  PLT 239   No results found for: TSH Lab Results  Component Value Date   HGBA1C 5.3 11/13/2019   Lab Results  Component Value Date   CHOL 141 11/13/2019   HDL 64 11/13/2019   LDLCALC 68 11/13/2019   TRIG 46 11/13/2019   CHOLHDL 2.2 11/13/2019    Significant Diagnostic Results in last 30 days:  CT ANGIO HEAD W OR WO CONTRAST  Result Date: 11/15/2019 CLINICAL DATA:  Follow-up examination for stroke. EXAM: CT ANGIOGRAPHY HEAD AND NECK TECHNIQUE: Multidetector CT imaging of the head and neck was performed using the standard protocol during bolus administration of intravenous contrast. Multiplanar CT image reconstructions and MIPs were obtained to evaluate the vascular anatomy. Carotid stenosis measurements  (when applicable) are obtained utilizing NASCET criteria, using the distal internal carotid diameter as the denominator. CONTRAST:  75mL OMNIPAQUE IOHEXOL 350 MG/ML SOLN COMPARISON:  Prior MRI from 11/13/2019. FINDINGS: CT HEAD FINDINGS Brain: Fairly sizable evolving right PCA territory infarct again seen, stable in size and distribution as compared to previous MRI. Mild patchy involvement of the lateral right thalamus noted. Few scattered areas of mild hyperdensity within the evolving cytotoxic edema consistent with petechial hemorrhage, also seen on prior MRI. No frank hemorrhagic transformation. Localized edema without significant regional mass effect. No other new large vessel territory infarct. No other acute intracranial hemorrhage. Underlying atrophy with chronic small vessel ischemic disease with chronic by parietal infarcts noted. No mass lesion or midline shift. No hydrocephalus or extra-axial fluid collection. Vascular: No hyperdense vessel. Scattered vascular calcifications noted within the carotid siphons. Skull: Scalp soft tissues and calvarium within normal limits. Sinuses: Chronic right sphenoid sinusitis noted. Paranasal sinuses are otherwise largely clear. No mastoid effusion. Orbits: Globes and orbital soft tissues demonstrate no acute finding. Review of the MIP images confirms the above findings CTA NECK FINDINGS Aortic arch: Visualized aortic arch of normal caliber with normal 3 vessel morphology. Moderate atheromatous change about the visualized arch and origin of the great vessels without hemodynamically significant stenosis. Visualized subclavian arteries widely patent. Right carotid system: Right common carotid artery widely patent from its origin to the bifurcation without stenosis. Minimal atheromatous change about the right bifurcation without stenosis. Scattered eccentric calcified plaque noted just distally within the proximal right ICA with associated short-segment stenosis of up to  50% by NASCET criteria (series 11, image 195). Right ICA otherwise widely patent distally to the skull base without stenosis or other abnormality. Left carotid system: Left common carotid artery patent from its origin to the bifurcation without stenosis. Mild atheromatous change about the left bifurcation without stenosis. Left ICA widely patent distally to the skull base without stenosis, dissection or occlusion. Vertebral arteries: Both vertebral arteries arise from the subclavian arteries. Left vertebral artery slightly dominant. Focal plaque at the origin of the left vertebral artery with no more than mild stenosis. Vertebral arteries otherwise widely patent  within the neck without stenosis, dissection or occlusion. Skeleton: No acute osseous abnormality. No discrete or worrisome osseous lesions. Moderate cervical spondylosis noted at C3-4 through C6-7. Patient is edentulous. Other neck: No other acute soft tissue abnormality within the neck. No mass lesion or adenopathy. Few scattered subcentimeter thyroid nodules noted, largest of which measures 6 mm, felt to be of no clinical significance given size and patient's advanced age. No follow-up imaging recommended regarding these lesions. Upper chest: 8 mm focus of ground-glass opacity noted within the right upper lobe, which could reflect a small focus of atelectasis versus pneumonitis (series 11, image 350). Layering secretions/debris noted within the upper esophageal lumen. Small layering left pleural effusion partially visualized. Visualized upper chest demonstrates no other acute finding. Review of the MIP images confirms the above findings CTA HEAD FINDINGS Anterior circulation: Petrous segments widely patent bilaterally. Atheromatous change throughout the carotid siphons with associated mild to moderate multifocal stenosis (estimated 30-50%), slightly worse on the right. A1 segments widely patent. Normal anterior communicating artery complex. Anterior  cerebral arteries widely patent to their distal aspects. No M1 stenosis or occlusion. Normal MCA bifurcations. Distal MCA branches well perfused and symmetric. Posterior circulation: Both vertebral arteries widely patent to the vertebrobasilar junction without stenosis. Neither PICA well visualized. Basilar widely patent to its distal aspect without stenosis. Superior cerebral arteries patent bilaterally. Both PCAs primarily supplied via the basilar. Both PCAs well perfused to their distal aspects without appreciable stenosis. Venous sinuses: Patent allowing for timing the contrast bolus. Anatomic variants: None significant. Review of the MIP images confirms the above findings IMPRESSION: CT HEAD IMPRESSION: 1. Continued interval evolution of right PCA territory infarct, stable in size and distribution as compared to previous MRI. Associated petechial hemorrhage without hemorrhagic transformation, also similar. 2. No other new acute intracranial abnormality. 3. Advanced cerebral atrophy with chronic small vessel ischemic disease, stable. CTA HEAD AND NECK IMPRESSION: 1. Negative CTA for large vessel occlusion. 2. 50% short-segment atheromatous stenosis involving the proximal right ICA. 3. Atheromatous plaque involving the carotid siphons with associated mild to moderate diffuse narrowing. 4. No other hemodynamically significant or correctable stenosis about the major arterial vasculature of the head and neck. 5. Subcentimeter focus of ground-glass opacity within the right upper lobe as above, which could reflect a small focus of pneumonitis versus atelectasis. 6. Small layering left pleural effusion. Electronically Signed   By: Rise Mu M.D.   On: 11/15/2019 01:00   CT ANGIO NECK W OR WO CONTRAST  Result Date: 11/15/2019 CLINICAL DATA:  Follow-up examination for stroke. EXAM: CT ANGIOGRAPHY HEAD AND NECK TECHNIQUE: Multidetector CT imaging of the head and neck was performed using the standard protocol  during bolus administration of intravenous contrast. Multiplanar CT image reconstructions and MIPs were obtained to evaluate the vascular anatomy. Carotid stenosis measurements (when applicable) are obtained utilizing NASCET criteria, using the distal internal carotid diameter as the denominator. CONTRAST:  75mL OMNIPAQUE IOHEXOL 350 MG/ML SOLN COMPARISON:  Prior MRI from 11/13/2019. FINDINGS: CT HEAD FINDINGS Brain: Fairly sizable evolving right PCA territory infarct again seen, stable in size and distribution as compared to previous MRI. Mild patchy involvement of the lateral right thalamus noted. Few scattered areas of mild hyperdensity within the evolving cytotoxic edema consistent with petechial hemorrhage, also seen on prior MRI. No frank hemorrhagic transformation. Localized edema without significant regional mass effect. No other new large vessel territory infarct. No other acute intracranial hemorrhage. Underlying atrophy with chronic small vessel ischemic disease with chronic by  parietal infarcts noted. No mass lesion or midline shift. No hydrocephalus or extra-axial fluid collection. Vascular: No hyperdense vessel. Scattered vascular calcifications noted within the carotid siphons. Skull: Scalp soft tissues and calvarium within normal limits. Sinuses: Chronic right sphenoid sinusitis noted. Paranasal sinuses are otherwise largely clear. No mastoid effusion. Orbits: Globes and orbital soft tissues demonstrate no acute finding. Review of the MIP images confirms the above findings CTA NECK FINDINGS Aortic arch: Visualized aortic arch of normal caliber with normal 3 vessel morphology. Moderate atheromatous change about the visualized arch and origin of the great vessels without hemodynamically significant stenosis. Visualized subclavian arteries widely patent. Right carotid system: Right common carotid artery widely patent from its origin to the bifurcation without stenosis. Minimal atheromatous change about  the right bifurcation without stenosis. Scattered eccentric calcified plaque noted just distally within the proximal right ICA with associated short-segment stenosis of up to 50% by NASCET criteria (series 11, image 195). Right ICA otherwise widely patent distally to the skull base without stenosis or other abnormality. Left carotid system: Left common carotid artery patent from its origin to the bifurcation without stenosis. Mild atheromatous change about the left bifurcation without stenosis. Left ICA widely patent distally to the skull base without stenosis, dissection or occlusion. Vertebral arteries: Both vertebral arteries arise from the subclavian arteries. Left vertebral artery slightly dominant. Focal plaque at the origin of the left vertebral artery with no more than mild stenosis. Vertebral arteries otherwise widely patent within the neck without stenosis, dissection or occlusion. Skeleton: No acute osseous abnormality. No discrete or worrisome osseous lesions. Moderate cervical spondylosis noted at C3-4 through C6-7. Patient is edentulous. Other neck: No other acute soft tissue abnormality within the neck. No mass lesion or adenopathy. Few scattered subcentimeter thyroid nodules noted, largest of which measures 6 mm, felt to be of no clinical significance given size and patient's advanced age. No follow-up imaging recommended regarding these lesions. Upper chest: 8 mm focus of ground-glass opacity noted within the right upper lobe, which could reflect a small focus of atelectasis versus pneumonitis (series 11, image 350). Layering secretions/debris noted within the upper esophageal lumen. Small layering left pleural effusion partially visualized. Visualized upper chest demonstrates no other acute finding. Review of the MIP images confirms the above findings CTA HEAD FINDINGS Anterior circulation: Petrous segments widely patent bilaterally. Atheromatous change throughout the carotid siphons with  associated mild to moderate multifocal stenosis (estimated 30-50%), slightly worse on the right. A1 segments widely patent. Normal anterior communicating artery complex. Anterior cerebral arteries widely patent to their distal aspects. No M1 stenosis or occlusion. Normal MCA bifurcations. Distal MCA branches well perfused and symmetric. Posterior circulation: Both vertebral arteries widely patent to the vertebrobasilar junction without stenosis. Neither PICA well visualized. Basilar widely patent to its distal aspect without stenosis. Superior cerebral arteries patent bilaterally. Both PCAs primarily supplied via the basilar. Both PCAs well perfused to their distal aspects without appreciable stenosis. Venous sinuses: Patent allowing for timing the contrast bolus. Anatomic variants: None significant. Review of the MIP images confirms the above findings IMPRESSION: CT HEAD IMPRESSION: 1. Continued interval evolution of right PCA territory infarct, stable in size and distribution as compared to previous MRI. Associated petechial hemorrhage without hemorrhagic transformation, also similar. 2. No other new acute intracranial abnormality. 3. Advanced cerebral atrophy with chronic small vessel ischemic disease, stable. CTA HEAD AND NECK IMPRESSION: 1. Negative CTA for large vessel occlusion. 2. 50% short-segment atheromatous stenosis involving the proximal right ICA. 3. Atheromatous plaque involving  the carotid siphons with associated mild to moderate diffuse narrowing. 4. No other hemodynamically significant or correctable stenosis about the major arterial vasculature of the head and neck. 5. Subcentimeter focus of ground-glass opacity within the right upper lobe as above, which could reflect a small focus of pneumonitis versus atelectasis. 6. Small layering left pleural effusion. Electronically Signed   By: Rise Mu M.D.   On: 11/15/2019 01:00   MR BRAIN WO CONTRAST  Result Date: 11/13/2019 CLINICAL DATA:   Stroke-like symptoms EXAM: MRI HEAD WITHOUT CONTRAST TECHNIQUE: Multiplanar, multiecho pulse sequences of the brain and surrounding structures were obtained without intravenous contrast. COMPARISON:  04/30/2014 FINDINGS: Brain: Confluent restricted diffusion in the right occipital and posteromedial temporal lobes with small acute infarct at the right thalamus. Petechial hemorrhage is seen at the level of the cortical infarct. Advanced brain atrophy, especially at the temporal and parietal lobes. There has been prior infarct at the bilateral parietal lobes. No acute hemorrhage, hydrocephalus, or masslike finding. Vascular: Major flow voids are preserved Skull and upper cervical spine: No focal marrow lesion. Sinuses/Orbits: Bilateral cataract resection IMPRESSION: 1. Acute right PCA territory infarct with petechial hemorrhage. 2. Advanced brain atrophy correlating with history of Alzheimer's disease. Electronically Signed   By: Marnee Spring M.D.   On: 11/13/2019 05:55   ECHOCARDIOGRAM COMPLETE  Result Date: 10/24/2019    ECHOCARDIOGRAM REPORT   Patient Name:   Jasmine Woodward Date of Exam: 10/24/2019 Medical Rec #:  [id number]       Height:       64.0 in Accession #:    [id number]      Weight:       141.2 lb Date of Birth:  1928/03/25        BSA:          1.687 m Patient Age:    89 years        BP:           139/89 mmHg Patient Gender: F               HR:           67 bpm. Exam Location:  Outpatient Procedure: 2D Echo Indications:    pedal edema  History:        Patient has no prior history of Echocardiogram examinations.  Sonographer:    Delcie Roch RDCS Referring Phys: [id number] Georgianne Fick IMPRESSIONS  1. Left ventricular ejection fraction, by estimation, is 60 to 65%. The left ventricle has normal function. The left ventricle has no regional wall motion abnormalities. Left ventricular diastolic parameters are consistent with Grade I diastolic dysfunction (impaired relaxation).  2. Right ventricular  systolic function is normal. The right ventricular size is normal. There is mildly elevated pulmonary artery systolic pressure. The estimated right ventricular systolic pressure is 39.7 mmHg.  3. Left atrial size was moderately dilated.  4. The mitral valve is normal in structure. Mild mitral valve regurgitation. No evidence of mitral stenosis.  5. The aortic valve is normal in structure. Aortic valve regurgitation is not visualized. No aortic stenosis is present.  6. The inferior vena cava is normal in size with greater than 50% respiratory variability, suggesting right atrial pressure of 3 mmHg. FINDINGS  Left Ventricle: Left ventricular ejection fraction, by estimation, is 60 to 65%. The left ventricle has normal function. The left ventricle has no regional wall motion abnormalities. The left ventricular internal cavity size was normal in size. There is  no left ventricular  hypertrophy. Left ventricular diastolic parameters are consistent with Grade I diastolic dysfunction (impaired relaxation). Right Ventricle: The right ventricular size is normal. No increase in right ventricular wall thickness. Right ventricular systolic function is normal. There is mildly elevated pulmonary artery systolic pressure. The tricuspid regurgitant velocity is 3.03  m/s, and with an assumed right atrial pressure of 3 mmHg, the estimated right ventricular systolic pressure is 39.7 mmHg. Left Atrium: Left atrial size was moderately dilated. Right Atrium: Right atrial size was normal in size. Pericardium: There is no evidence of pericardial effusion. Mitral Valve: The mitral valve is normal in structure. Normal mobility of the mitral valve leaflets. Mild mitral valve regurgitation. No evidence of mitral valve stenosis. Tricuspid Valve: The tricuspid valve is normal in structure. Tricuspid valve regurgitation is trivial. No evidence of tricuspid stenosis. Aortic Valve: The aortic valve is normal in structure. Aortic valve regurgitation  is not visualized. No aortic stenosis is present. Pulmonic Valve: The pulmonic valve was normal in structure. Pulmonic valve regurgitation is trivial. No evidence of pulmonic stenosis. Aorta: The aortic root is normal in size and structure. Venous: The inferior vena cava is normal in size with greater than 50% respiratory variability, suggesting right atrial pressure of 3 mmHg. IAS/Shunts: No atrial level shunt detected by color flow Doppler.  LEFT VENTRICLE PLAX 2D LVIDd:         4.40 cm  Diastology LVIDs:         2.90 cm  LV e' lateral:   10.00 cm/s LV PW:         1.00 cm  LV E/e' lateral: 9.5 LV IVS:        0.90 cm  LV e' medial:    6.96 cm/s LVOT diam:     2.10 cm  LV E/e' medial:  13.7 LV SV:         70 LV SV Index:   42 LVOT Area:     3.46 cm  IVC IVC diam: 2.10 cm LEFT ATRIUM             Index       RIGHT ATRIUM           Index LA diam:        4.00 cm 2.37 cm/m  RA Area:     15.80 cm LA Vol (A2C):   53.9 ml 31.94 ml/m RA Volume:   36.40 ml  21.57 ml/m LA Vol (A4C):   63.8 ml 37.81 ml/m LA Biplane Vol: 59.6 ml 35.32 ml/m  AORTIC VALVE LVOT Vmax:   67.90 cm/s LVOT Vmean:  48.400 cm/s LVOT VTI:    0.203 m  AORTA Ao Root diam: 2.40 cm MITRAL VALVE               TRICUSPID VALVE MV Area (PHT): 4.89 cm    TR Peak grad:   36.7 mmHg MV Decel Time: 155 msec    TR Vmax:        303.00 cm/s MV E velocity: 95.10 cm/s MV A velocity: 51.80 cm/s  SHUNTS MV E/A ratio:  1.84        Systemic VTI:  0.20 m                            Systemic Diam: 2.10 cm Donato Schultz MD Electronically signed by Donato Schultz MD Signature Date/Time: 10/24/2019/2:02:10 PM    Final     Assessment/Plan Stroke (HCC) Slightly weakness left arm, leg,  continue ASA then Eliquis, Atorvastatin. PT/OT/ST in SNF [facility]  Atrial fibrillation (HCC) Heart rate is in control, continue Metoprolol, stop ASA  qd 11/20/19, starts Eliquis  bid 11/21/19  Hypertension Sbp is elevated, f/u Bp110/78 the patient denied headache, change of vision, chest  pain/pressrue, palpitation, or SOB, continue HCTZ, Metoprolol.   Alzheimer disease (HCC) Oriented to person, not place or time, feeding self, continue Donepezil     [facility] plan of care reviewed with the patient and charge nurse.   Labs/tests ordered:  none  Time spend 35 minutes

## 2019-11-18 NOTE — Assessment & Plan Note (Signed)
Oriented to person, not place or time, feeding self, continue Donepezil

## 2019-11-18 NOTE — Assessment & Plan Note (Addendum)
Sbp is elevated, f/u Bp110/78 the patient denied headache, change of vision, chest pain/pressrue, palpitation, or SOB, continue HCTZ, Metoprolol.

## 2019-11-18 NOTE — Assessment & Plan Note (Signed)
Heart rate is in control, continue Metoprolol, stop ASA 325mg  qd 11/20/19, starts Eliquis 5mg  bid 11/21/19

## 2019-11-19 ENCOUNTER — Encounter: Payer: Self-pay | Admitting: Nurse Practitioner

## 2019-11-21 ENCOUNTER — Non-Acute Institutional Stay (SKILLED_NURSING_FACILITY): Payer: Medicare Other | Admitting: Internal Medicine

## 2019-11-21 ENCOUNTER — Encounter: Payer: Self-pay | Admitting: Internal Medicine

## 2019-11-21 DIAGNOSIS — I10 Essential (primary) hypertension: Secondary | ICD-10-CM

## 2019-11-21 DIAGNOSIS — G301 Alzheimer's disease with late onset: Secondary | ICD-10-CM | POA: Diagnosis not present

## 2019-11-21 DIAGNOSIS — I63431 Cerebral infarction due to embolism of right posterior cerebral artery: Secondary | ICD-10-CM

## 2019-11-21 DIAGNOSIS — N289 Disorder of kidney and ureter, unspecified: Secondary | ICD-10-CM

## 2019-11-21 DIAGNOSIS — F0280 Dementia in other diseases classified elsewhere without behavioral disturbance: Secondary | ICD-10-CM

## 2019-11-21 DIAGNOSIS — I480 Paroxysmal atrial fibrillation: Secondary | ICD-10-CM | POA: Diagnosis not present

## 2019-11-21 NOTE — Progress Notes (Signed)
Provider:  Einar Crow MD Location:    [facility] Room Number: 10 Place of Service:  ALF (13)  PCP: Georgianne Fick, MD Patient Care Team: Georgianne Fick, MD as PCP - General (Internal Medicine)  Extended Emergency Contact Information Primary Emergency Contact: Marval Regal Address: [address]          [place], [place] [address] [place] of [place] Home Phone: [phone] Relation: Son  Code Status: DNR Goals of Care: Advanced Directive information Advanced Directives 11/21/2019  Does Patient Have a Medical Advance Directive? Yes  Type of Estate agent of [place];Out of facility DNR (pink MOST or yellow form);Living will  Does patient want to make changes to medical advance directive? No - Patient declined  Copy of Healthcare Power of Attorney in Chart? Yes - validated most recent copy scanned in chart (See row information)  Would patient like information on creating a medical advance directive? -  Pre-existing out of facility DNR order (yellow form or pink MOST form) Pink MOST form placed in chart (order not valid for inpatient use)      Chief Complaint  Patient presents with  . New Admit To SNF    Admission to SNF    HPI: Patient is a 86 y.o. female seen today for admission to SNF for therapy and long-term care. Patient has a history of Alzheimer's dementia, hypertension.  Also has a history of atrial fibrillation.  Seems like she was off her anticoagulant only on high-dose of aspirin..  She used to live in [place]  Was noticed by nurses to have a left-sided weakness and slurred speech.  Had MRI done which showed acute right PCA territory infarct.  Neurology thought that it was due to stopping the anticoagulation.  She was on aspirin on aspirin which was changed to Eliquis.  Her echo showed EF of 60 to 65% no source of embolus LA moderately dilated.  Patient is now in SNF for therapy.  She was unable to give much  history.  Does follow commands but is not oriented.  Very slow to respond. Did not have any acute complaints.  Past Medical History:  Diagnosis Date  . Alzheimer disease (HCC)   . Atrial fibrillation (HCC)   . Hypertension    History reviewed. No pertinent surgical history.  reports that she has never smoked. She has never used smokeless tobacco. She reports previous alcohol use. She reports that she does not use drugs. Social History   Socioeconomic History  . Marital status: Single    Spouse name: Not on file  . Number of children: Not on file  . Years of education: Not on file  . Highest education level: Not on file  Occupational History  . Not on file  Tobacco Use  . Smoking status: Never Smoker  . Smokeless tobacco: Never Used  Vaping Use  . Vaping Use: Unknown  Substance and Sexual Activity  . Alcohol use: Not Currently  . Drug use: Never  . Sexual activity: Not Currently  Other Topics Concern  . Not on file  Social History Narrative   ** Merged History Encounter **       Social Determinants of Health   Financial Resource Strain:   . Difficulty of Paying Living Expenses:   Food Insecurity:   . Worried About Programme researcher, broadcasting/film/video in the Last Year:   . [employer] of Food in the Last Year:   Transportation Needs:   . Lack of  Transportation (Medical):   Marland Kitchen Lack of Transportation (Non-Medical):   Physical Activity:   . Days of Exercise per Week:   . Minutes of Exercise per Session:   Stress:   . Feeling of Stress :   Social Connections:   . Frequency of Communication with Friends and Family:   . Frequency of Social Gatherings with Friends and Family:   . Attends Religious Services:   . Active Member of Clubs or Organizations:   . Attends Banker Meetings:   Marland Kitchen Marital Status:   Intimate Partner Violence:   . Fear of Current or Ex-Partner:   . Emotionally Abused:   Marland Kitchen Physically Abused:   . Sexually Abused:     Functional Status Survey:     History reviewed. No pertinent family history.  Health Maintenance  Topic Date Due  . URINE MICROALBUMIN  Never done  . DEXA SCAN  Never done  . INFLUENZA VACCINE  01/12/2020  . TETANUS/TDAP  12/22/2028  . COVID-19 Vaccine  Completed  . PNA vac Low Risk Adult  Completed    Allergies  Allergen Reactions  . Bee Venom     Bee Stings.    Allergies as of 11/21/2019      Reactions   Bee Venom    Bee Stings.      Medication List       Accurate as of November 21, 2019  3:59 PM. If you have any questions, ask your nurse or doctor.        STOP taking these medications   aspirin 325 MG tablet Stopped by: Mahlon Gammon, MD   indapamide 2.5 MG tablet Commonly known as: LOZOL Stopped by: Mahlon Gammon, MD     TAKE these medications   apixaban 5 MG Tabs tablet Commonly known as: ELIQUIS Take 5 mg by mouth 2 (two) times daily. What changed: Another medication with the same name was removed. Continue taking this medication, and follow the directions you see here. Changed by: Mahlon Gammon, MD   atorvastatin 20 MG tablet Commonly known as: LIPITOR Take 20 mg by mouth daily. What changed: Another medication with the same name was removed. Continue taking this medication, and follow the directions you see here. Changed by: Mahlon Gammon, MD   bimatoprost 0.01 % Soln Commonly known as: LUMIGAN Place 1 drop into both eyes at bedtime.   CALCIUM 600 + D PO Take 1 tablet by mouth in the morning and at bedtime.   CENTRUM SILVER PO Take 1 tablet by mouth daily. 424mcg-300mcg.   donepezil 5 MG tablet Commonly known as: ARICEPT Take 5 mg by mouth at bedtime.   hydrochlorothiazide 25 MG tablet Commonly known as: HYDRODIURIL Take 25 mg by mouth daily.   Metoprolol Succinate 50 MG Cs24 Take 50 mg by mouth daily. Hold if Systolic is < 95 or if pulse is less than 60   triamcinolone cream 0.1 % Commonly known as: KENALOG Apply 1 application topically 2 (two) times daily as  needed.   zinc oxide 20 % ointment Apply 1 application topically as needed for irritation.       Review of Systems  Unable to perform ROS: Dementia    Vitals:   11/21/19 1414  BP: (!) 160/76  Pulse: 64  Temp: (!) 97.3 F (36.3 C)  SpO2: 94%  Weight: 175 lb (79.4 kg)  Height: 5\' 1"  (1.549 m)   Body mass index is 33.07 kg/m. Physical Exam Vitals reviewed.  Constitutional:  Appearance: Normal appearance.  HENT:     Head: Normocephalic.     Nose: Nose normal.     Mouth/Throat:     Mouth: Mucous membranes are moist.     Pharynx: Oropharynx is clear.  Eyes:     Pupils: Pupils are equal, round, and reactive to light.  Cardiovascular:     Rate and Rhythm: Normal rate. Rhythm irregular.     Pulses: Normal pulses.     Heart sounds: No murmur heard.   Pulmonary:     Effort: Pulmonary effort is normal. No respiratory distress.     Breath sounds: Normal breath sounds. No wheezing or rales.  Abdominal:     General: Abdomen is flat. Bowel sounds are normal.     Palpations: Abdomen is soft.  Musculoskeletal:     Cervical back: Neck supple.     Comments: Mild Edema Bilateral  Skin:    General: Skin is warm and dry.  Neurological:     Mental Status: She is alert.     Comments: Responds very Slowly. Does follows Commands simple. Moving her Extremities well. Strengths seems good in Left UE.  Psychiatric:        Mood and Affect: Mood normal.     Labs reviewed: Basic Metabolic Panel: Recent Labs    05/16/19 0000 11/12/19 2259 11/14/19 0350  NA 140 140 136  K 4.1 4.4 4.3  CL 102 101 102  CO2 31* 27 27  GLUCOSE  --  104* 132*  BUN 29* 27* 19  CREATININE 1.0 1.18* 1.15*  CALCIUM 9.4 9.3 8.5*   Liver Function Tests: Recent Labs    05/16/19 0000  AST 19  ALT 15  ALKPHOS 86  ALBUMIN 3.8   No results for input(s): LIPASE, AMYLASE in the last 8760 hours. No results for input(s): AMMONIA in the last 8760 hours. CBC: Recent Labs    11/12/19 2259  WBC  7.7  HGB 11.6*  HCT 35.5*  MCV 92.9  PLT 239   Cardiac Enzymes: No results for input(s): CKTOTAL, CKMB, CKMBINDEX, TROPONINI in the last 8760 hours. BNP: Invalid input(s): POCBNP Lab Results  Component Value Date   HGBA1C 5.3 11/13/2019   No results found for: TSH No results found for: VITAMINB12 No results found for: FOLATE No results found for: IRON, TIBC, FERRITIN  Imaging and Procedures obtained prior to SNF admission: MR BRAIN WO CONTRAST  Result Date: 11/13/2019 CLINICAL DATA:  Stroke-like symptoms EXAM: MRI HEAD WITHOUT CONTRAST TECHNIQUE: Multiplanar, multiecho pulse sequences of the brain and surrounding structures were obtained without intravenous contrast. COMPARISON:  04/30/2014 FINDINGS: Brain: Confluent restricted diffusion in the right occipital and posteromedial temporal lobes with small acute infarct at the right thalamus. Petechial hemorrhage is seen at the level of the cortical infarct. Advanced brain atrophy, especially at the temporal and parietal lobes. There has been prior infarct at the bilateral parietal lobes. No acute hemorrhage, hydrocephalus, or masslike finding. Vascular: Major flow voids are preserved Skull and upper cervical spine: No focal marrow lesion. Sinuses/Orbits: Bilateral cataract resection IMPRESSION: 1. Acute right PCA territory infarct with petechial hemorrhage. 2. Advanced brain atrophy correlating with history of Alzheimer's disease. Electronically Signed   By: Marnee Spring M.D.   On: 11/13/2019 05:55    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) Restarted on Eliquis On Statin LDL less then 100 Will work with therapy but per Nurses having hard time due to her Dementia Paroxysmal atrial fibrillation (HCC)  On Eliquis and Metoprolol  Essential hypertension Midily Elevated On Metoprolol and HCTZ Was taken off Indapamide  Late onset Alzheimer's disease without behavioral  disturbance (HCC) On Aricept Repeat MMSE and ? Namenda  Mild renal insufficiency Repeat BMP Hyperlipidemia On Statin  Family/ staff Communication:   Labs/tests ordered: CMP,CBC,TSH  Total time spent in this patient care encounter was  45_  minutes; greater than 50% of the visit spent counseling patient and staff, reviewing records , Labs and coordinating care for problems addressed at this encounter.

## 2019-11-25 LAB — BASIC METABOLIC PANEL
BUN: 27 — AB (ref 4–21)
CO2: 27 — AB (ref 13–22)
Chloride: 101 (ref 99–108)
Creatinine: 1.2 — AB (ref 0.5–1.1)
Glucose: 101
Potassium: 4 (ref 3.4–5.3)
Sodium: 138 (ref 137–147)

## 2019-11-25 LAB — CBC AND DIFFERENTIAL
HCT: 37 (ref 36–46)
Hemoglobin: 12.6 (ref 12.0–16.0)
Platelets: 338 (ref 150–399)
WBC: 9.6

## 2019-11-25 LAB — COMPREHENSIVE METABOLIC PANEL: Calcium: 8.8 (ref 8.7–10.7)

## 2019-11-25 LAB — HEPATIC FUNCTION PANEL
ALT: 16 (ref 7–35)
AST: 18 (ref 13–35)
Alkaline Phosphatase: 95 (ref 25–125)
Bilirubin, Total: 0.8

## 2019-11-25 LAB — TSH: TSH: 1.75 (ref 0.41–5.90)

## 2019-11-25 LAB — CBC: RBC: 4.22 (ref 3.87–5.11)

## 2019-11-28 ENCOUNTER — Non-Acute Institutional Stay (SKILLED_NURSING_FACILITY): Payer: Medicare Other | Admitting: Nurse Practitioner

## 2019-11-28 ENCOUNTER — Encounter: Payer: Self-pay | Admitting: Nurse Practitioner

## 2019-11-28 DIAGNOSIS — I10 Essential (primary) hypertension: Secondary | ICD-10-CM | POA: Diagnosis not present

## 2019-11-28 DIAGNOSIS — L89629 Pressure ulcer of left heel, unspecified stage: Secondary | ICD-10-CM | POA: Insufficient documentation

## 2019-11-28 DIAGNOSIS — I63431 Cerebral infarction due to embolism of right posterior cerebral artery: Secondary | ICD-10-CM

## 2019-11-28 DIAGNOSIS — G301 Alzheimer's disease with late onset: Secondary | ICD-10-CM | POA: Diagnosis not present

## 2019-11-28 DIAGNOSIS — I480 Paroxysmal atrial fibrillation: Secondary | ICD-10-CM

## 2019-11-28 DIAGNOSIS — L89622 Pressure ulcer of left heel, stage 2: Secondary | ICD-10-CM | POA: Diagnosis not present

## 2019-11-28 DIAGNOSIS — F0280 Dementia in other diseases classified elsewhere without behavioral disturbance: Secondary | ICD-10-CM

## 2019-11-28 DIAGNOSIS — N289 Disorder of kidney and ureter, unspecified: Secondary | ICD-10-CM

## 2019-11-28 NOTE — Progress Notes (Signed)
Location:  [facility] Room Number: 10 Place of Service:  ALF [phone]) Provider: Chipper Oman NP  Georgianne Fick, MD  Patient Care Team: Georgianne Fick, MD as PCP - General (Internal Medicine)  Extended Emergency Contact Information Primary Emergency Contact: Marval Regal Address: [address]          [place], [place] [address] [place] Home Phone: [phone] Relation: Son  Code Status:  DNR Goals of care: Advanced Directive information Advanced Directives 11/21/2019  Does Patient Have a Medical Advance Directive? Yes  Type of Estate agent of [place];Out of facility DNR (pink MOST or yellow form);Living will  Does patient want to make changes to medical advance directive? No - Patient declined  Copy of Healthcare Power of Attorney in Chart? Yes - validated most recent copy scanned in chart (See row information)  Would patient like information on creating a medical advance directive? -  Pre-existing out of facility DNR order (yellow form or pink MOST form) Pink MOST form placed in chart (order not valid for inpatient use)     Chief Complaint  Patient presents with  . Acute Visit    Acute, left heel wound     HPI:  Pt is a 86 y.o. female seen today for an acute visit for left heel wound,      Hx of acute CVA with left side weakness, on Eliquis, Atorvastatin, SN[facility] for therapy.  Hx of Alzheimer's dementia, on Donepezil 5mg  qd.  HTN/trace edema BLE, on HCTZ 25mg  qd, Metoprolol 50mg  qd.  AFib, heart rate is in control, continue Metoprolol.   Past Medical History:  Diagnosis Date  . Alzheimer disease (HCC)   . Atrial fibrillation (HCC)   . Hypertension    History reviewed. No pertinent surgical history.  Allergies  Allergen Reactions  . Bee Venom     Bee Stings.    Allergies as of 11/28/2019      Reactions   Bee Venom    Bee Stings.      Medication List       Accurate as of November 28, 2019  5:19 PM. If you have any questions, ask your nurse or doctor.        STOP taking these medications   apixaban 5 MG Tabs tablet Commonly known as: ELIQUIS Stopped by: Cicero Noy X Tylan Kinn, NP   atorvastatin 20 MG tablet Commonly known as: LIPITOR Stopped by: Niv Darley X Kasten Leveque, NP     TAKE these medications   aspirin 325 MG tablet Take 325 mg by mouth daily.   bimatoprost 0.01 % Soln Commonly known as: LUMIGAN Place 1 drop into both eyes at bedtime.   CALCIUM 600 + D PO Take 1 tablet by mouth in the morning and at bedtime.   CENTRUM SILVER PO Take 1 tablet by mouth daily. 431mcg-300mcg.   donepezil 5 MG tablet Commonly known as: ARICEPT Take 5 mg by mouth at bedtime.   hydrochlorothiazide 25 MG tablet Commonly known as: HYDRODIURIL Take 25 mg by mouth daily.   indapamide 2.5 MG tablet Commonly known as: LOZOL Take 2.5 mg by mouth daily.   Metoprolol Succinate 50 MG Cs24 Take 50 mg by mouth daily. Hold if Systolic is < 95 or if pulse is less than 60   triamcinolone cream 0.1 % Commonly known as: KENALOG Apply 1 application topically 2 (two) times daily as needed.   zinc oxide 20 % ointment Apply 1 application topically as needed for  irritation.       Review of Systems  Constitutional: Negative for fatigue and fever.  HENT: Positive for hearing loss. Negative for congestion, trouble swallowing and voice change.   Respiratory: Negative for cough, shortness of breath and wheezing.   Cardiovascular: Positive for leg swelling. Negative for chest pain and palpitations.  Gastrointestinal: Negative for abdominal pain, constipation and nausea.  Genitourinary: Negative for difficulty urinating and dysuria.  Musculoskeletal: Positive for gait problem.  Skin: Positive for wound.       Left heal pressure ulcer.   Neurological: Positive for speech difficulty and weakness. Negative for dizziness, tremors and headaches.       Dementia  Psychiatric/Behavioral: Negative for  behavioral problems and sleep disturbance. The patient is not nervous/anxious.     Immunization History  Administered Date(s) Administered  . Influenza-Unspecified 04/02/2014, 04/16/2015, 04/01/2016, 04/14/2017  . Moderna SARS-COVID-2 Vaccination 06/17/2019, 07/15/2019  . Pneumococcal Conjugate-13 03/24/2014  . Pneumococcal Polysaccharide-23 02/26/2009  . Tdap 03/08/2012, 12/23/2018  . Zoster 09/03/2007   Pertinent  Health Maintenance Due  Topic Date Due  . URINE MICROALBUMIN  Never done  . DEXA SCAN  Never done  . INFLUENZA VACCINE  01/12/2020  . PNA vac Low Risk Adult  Completed   No flowsheet data found. Functional Status Survey:    Vitals:   11/28/19 1110  BP: (!) 160/76  Pulse: 64  Resp: 18  Temp: (!) 97.3 F (36.3 C)  SpO2: 94%  Weight: 175 lb (79.4 kg)  Height: 5\' 1"  (1.549 m)   Body mass index is 33.07 kg/m. Physical Exam Vitals and nursing note reviewed.  Constitutional:      Appearance: Normal appearance.  HENT:     Head: Normocephalic and atraumatic.     Nose: Nose normal.     Mouth/Throat:     Mouth: Mucous membranes are moist.  Eyes:     Extraocular Movements: Extraocular movements intact.     Conjunctiva/sclera: Conjunctivae normal.     Pupils: Pupils are equal, round, and reactive to light.  Cardiovascular:     Rate and Rhythm: Normal rate and regular rhythm.     Heart sounds: No murmur heard.   Pulmonary:     Effort: Pulmonary effort is normal.     Breath sounds: No wheezing, rhonchi or rales.  Abdominal:     General: Bowel sounds are normal. There is no distension.     Palpations: Abdomen is soft.     Tenderness: There is no abdominal tenderness. There is no right CVA tenderness, left CVA tenderness, guarding or rebound.  Musculoskeletal:     Cervical back: Normal range of motion and neck supple.     Right lower leg: Edema present.     Left lower leg: Edema present.     Comments: Trace edema BLE  Skin:    General: Skin is warm and  dry.     Comments: Left heel open area about a pea sized, no s/s of infection  Neurological:     General: No focal deficit present.     Mental Status: She is alert. Mental status is at baseline.     Comments: Oriented to person  Psychiatric:        Mood and Affect: Mood normal.        Behavior: Behavior normal.     Labs reviewed: Recent Labs    05/16/19 0000 11/12/19 2259 11/14/19 0350  NA 140 140 136  K 4.1 4.4 4.3  CL 102 101 102  CO2 31* 27 27  GLUCOSE  --  104* 132*  BUN 29* 27* 19  CREATININE 1.0 1.18* 1.15*  CALCIUM 9.4 9.3 8.5*   Recent Labs    05/16/19 0000  AST 19  ALT 15  ALKPHOS 86  ALBUMIN 3.8   Recent Labs    11/12/19 2259  WBC 7.7  HGB 11.6*  HCT 35.5*  MCV 92.9  PLT 239   No results found for: TSH Lab Results  Component Value Date   HGBA1C 5.3 11/13/2019   Lab Results  Component Value Date   CHOL 141 11/13/2019   HDL 64 11/13/2019   LDLCALC 68 11/13/2019   TRIG 46 11/13/2019   CHOLHDL 2.2 11/13/2019    Significant Diagnostic Results in last 30 days:  CT ANGIO HEAD W OR WO CONTRAST  Result Date: 11/15/2019 CLINICAL DATA:  Follow-up examination for stroke. EXAM: CT ANGIOGRAPHY HEAD AND NECK TECHNIQUE: Multidetector CT imaging of the head and neck was performed using the standard protocol during bolus administration of intravenous contrast. Multiplanar CT image reconstructions and MIPs were obtained to evaluate the vascular anatomy. Carotid stenosis measurements (when applicable) are obtained utilizing NASCET criteria, using the distal internal carotid diameter as the denominator. CONTRAST:  75mL OMNIPAQUE IOHEXOL 350 MG/ML SOLN COMPARISON:  Prior MRI from 11/13/2019. FINDINGS: CT HEAD FINDINGS Brain: Fairly sizable evolving right PCA territory infarct again seen, stable in size and distribution as compared to previous MRI. Mild patchy involvement of the lateral right thalamus noted. Few scattered areas of mild hyperdensity within the evolving  cytotoxic edema consistent with petechial hemorrhage, also seen on prior MRI. No frank hemorrhagic transformation. Localized edema without significant regional mass effect. No other new large vessel territory infarct. No other acute intracranial hemorrhage. Underlying atrophy with chronic small vessel ischemic disease with chronic by parietal infarcts noted. No mass lesion or midline shift. No hydrocephalus or extra-axial fluid collection. Vascular: No hyperdense vessel. Scattered vascular calcifications noted within the carotid siphons. Skull: Scalp soft tissues and calvarium within normal limits. Sinuses: Chronic right sphenoid sinusitis noted. Paranasal sinuses are otherwise largely clear. No mastoid effusion. Orbits: Globes and orbital soft tissues demonstrate no acute finding. Review of the MIP images confirms the above findings CTA NECK FINDINGS Aortic arch: Visualized aortic arch of normal caliber with normal 3 vessel morphology. Moderate atheromatous change about the visualized arch and origin of the great vessels without hemodynamically significant stenosis. Visualized subclavian arteries widely patent. Right carotid system: Right common carotid artery widely patent from its origin to the bifurcation without stenosis. Minimal atheromatous change about the right bifurcation without stenosis. Scattered eccentric calcified plaque noted just distally within the proximal right ICA with associated short-segment stenosis of up to 50% by NASCET criteria (series 11, image 195). Right ICA otherwise widely patent distally to the skull base without stenosis or other abnormality. Left carotid system: Left common carotid artery patent from its origin to the bifurcation without stenosis. Mild atheromatous change about the left bifurcation without stenosis. Left ICA widely patent distally to the skull base without stenosis, dissection or occlusion. Vertebral arteries: Both vertebral arteries arise from the subclavian  arteries. Left vertebral artery slightly dominant. Focal plaque at the origin of the left vertebral artery with no more than mild stenosis. Vertebral arteries otherwise widely patent within the neck without stenosis, dissection or occlusion. Skeleton: No acute osseous abnormality. No discrete or worrisome osseous lesions. Moderate cervical spondylosis noted at C3-4 through C6-7. Patient is edentulous. Other neck: No other acute soft  tissue abnormality within the neck. No mass lesion or adenopathy. Few scattered subcentimeter thyroid nodules noted, largest of which measures 6 mm, felt to be of no clinical significance given size and patient's advanced age. No follow-up imaging recommended regarding these lesions. Upper chest: 8 mm focus of ground-glass opacity noted within the right upper lobe, which could reflect a small focus of atelectasis versus pneumonitis (series 11, image 350). Layering secretions/debris noted within the upper esophageal lumen. Small layering left pleural effusion partially visualized. Visualized upper chest demonstrates no other acute finding. Review of the MIP images confirms the above findings CTA HEAD FINDINGS Anterior circulation: Petrous segments widely patent bilaterally. Atheromatous change throughout the carotid siphons with associated mild to moderate multifocal stenosis (estimated 30-50%), slightly worse on the right. A1 segments widely patent. Normal anterior communicating artery complex. Anterior cerebral arteries widely patent to their distal aspects. No M1 stenosis or occlusion. Normal MCA bifurcations. Distal MCA branches well perfused and symmetric. Posterior circulation: Both vertebral arteries widely patent to the vertebrobasilar junction without stenosis. Neither PICA well visualized. Basilar widely patent to its distal aspect without stenosis. Superior cerebral arteries patent bilaterally. Both PCAs primarily supplied via the basilar. Both PCAs well perfused to their  distal aspects without appreciable stenosis. Venous sinuses: Patent allowing for timing the contrast bolus. Anatomic variants: None significant. Review of the MIP images confirms the above findings IMPRESSION: CT HEAD IMPRESSION: 1. Continued interval evolution of right PCA territory infarct, stable in size and distribution as compared to previous MRI. Associated petechial hemorrhage without hemorrhagic transformation, also similar. 2. No other new acute intracranial abnormality. 3. Advanced cerebral atrophy with chronic small vessel ischemic disease, stable. CTA HEAD AND NECK IMPRESSION: 1. Negative CTA for large vessel occlusion. 2. 50% short-segment atheromatous stenosis involving the proximal right ICA. 3. Atheromatous plaque involving the carotid siphons with associated mild to moderate diffuse narrowing. 4. No other hemodynamically significant or correctable stenosis about the major arterial vasculature of the head and neck. 5. Subcentimeter focus of ground-glass opacity within the right upper lobe as above, which could reflect a small focus of pneumonitis versus atelectasis. 6. Small layering left pleural effusion. Electronically Signed   By: Rise Mu M.D.   On: 11/15/2019 01:00   CT ANGIO NECK W OR WO CONTRAST  Result Date: 11/15/2019 CLINICAL DATA:  Follow-up examination for stroke. EXAM: CT ANGIOGRAPHY HEAD AND NECK TECHNIQUE: Multidetector CT imaging of the head and neck was performed using the standard protocol during bolus administration of intravenous contrast. Multiplanar CT image reconstructions and MIPs were obtained to evaluate the vascular anatomy. Carotid stenosis measurements (when applicable) are obtained utilizing NASCET criteria, using the distal internal carotid diameter as the denominator. CONTRAST:  46mL OMNIPAQUE IOHEXOL 350 MG/ML SOLN COMPARISON:  Prior MRI from 11/13/2019. FINDINGS: CT HEAD FINDINGS Brain: Fairly sizable evolving right PCA territory infarct again seen,  stable in size and distribution as compared to previous MRI. Mild patchy involvement of the lateral right thalamus noted. Few scattered areas of mild hyperdensity within the evolving cytotoxic edema consistent with petechial hemorrhage, also seen on prior MRI. No frank hemorrhagic transformation. Localized edema without significant regional mass effect. No other new large vessel territory infarct. No other acute intracranial hemorrhage. Underlying atrophy with chronic small vessel ischemic disease with chronic by parietal infarcts noted. No mass lesion or midline shift. No hydrocephalus or extra-axial fluid collection. Vascular: No hyperdense vessel. Scattered vascular calcifications noted within the carotid siphons. Skull: Scalp soft tissues and calvarium within normal limits.  Sinuses: Chronic right sphenoid sinusitis noted. Paranasal sinuses are otherwise largely clear. No mastoid effusion. Orbits: Globes and orbital soft tissues demonstrate no acute finding. Review of the MIP images confirms the above findings CTA NECK FINDINGS Aortic arch: Visualized aortic arch of normal caliber with normal 3 vessel morphology. Moderate atheromatous change about the visualized arch and origin of the great vessels without hemodynamically significant stenosis. Visualized subclavian arteries widely patent. Right carotid system: Right common carotid artery widely patent from its origin to the bifurcation without stenosis. Minimal atheromatous change about the right bifurcation without stenosis. Scattered eccentric calcified plaque noted just distally within the proximal right ICA with associated short-segment stenosis of up to 50% by NASCET criteria (series 11, image 195). Right ICA otherwise widely patent distally to the skull base without stenosis or other abnormality. Left carotid system: Left common carotid artery patent from its origin to the bifurcation without stenosis. Mild atheromatous change about the left bifurcation  without stenosis. Left ICA widely patent distally to the skull base without stenosis, dissection or occlusion. Vertebral arteries: Both vertebral arteries arise from the subclavian arteries. Left vertebral artery slightly dominant. Focal plaque at the origin of the left vertebral artery with no more than mild stenosis. Vertebral arteries otherwise widely patent within the neck without stenosis, dissection or occlusion. Skeleton: No acute osseous abnormality. No discrete or worrisome osseous lesions. Moderate cervical spondylosis noted at C3-4 through C6-7. Patient is edentulous. Other neck: No other acute soft tissue abnormality within the neck. No mass lesion or adenopathy. Few scattered subcentimeter thyroid nodules noted, largest of which measures 6 mm, felt to be of no clinical significance given size and patient's advanced age. No follow-up imaging recommended regarding these lesions. Upper chest: 8 mm focus of ground-glass opacity noted within the right upper lobe, which could reflect a small focus of atelectasis versus pneumonitis (series 11, image 350). Layering secretions/debris noted within the upper esophageal lumen. Small layering left pleural effusion partially visualized. Visualized upper chest demonstrates no other acute finding. Review of the MIP images confirms the above findings CTA HEAD FINDINGS Anterior circulation: Petrous segments widely patent bilaterally. Atheromatous change throughout the carotid siphons with associated mild to moderate multifocal stenosis (estimated 30-50%), slightly worse on the right. A1 segments widely patent. Normal anterior communicating artery complex. Anterior cerebral arteries widely patent to their distal aspects. No M1 stenosis or occlusion. Normal MCA bifurcations. Distal MCA branches well perfused and symmetric. Posterior circulation: Both vertebral arteries widely patent to the vertebrobasilar junction without stenosis. Neither PICA well visualized. Basilar  widely patent to its distal aspect without stenosis. Superior cerebral arteries patent bilaterally. Both PCAs primarily supplied via the basilar. Both PCAs well perfused to their distal aspects without appreciable stenosis. Venous sinuses: Patent allowing for timing the contrast bolus. Anatomic variants: None significant. Review of the MIP images confirms the above findings IMPRESSION: CT HEAD IMPRESSION: 1. Continued interval evolution of right PCA territory infarct, stable in size and distribution as compared to previous MRI. Associated petechial hemorrhage without hemorrhagic transformation, also similar. 2. No other new acute intracranial abnormality. 3. Advanced cerebral atrophy with chronic small vessel ischemic disease, stable. CTA HEAD AND NECK IMPRESSION: 1. Negative CTA for large vessel occlusion. 2. 50% short-segment atheromatous stenosis involving the proximal right ICA. 3. Atheromatous plaque involving the carotid siphons with associated mild to moderate diffuse narrowing. 4. No other hemodynamically significant or correctable stenosis about the major arterial vasculature of the head and neck. 5. Subcentimeter focus of ground-glass opacity within the  right upper lobe as above, which could reflect a small focus of pneumonitis versus atelectasis. 6. Small layering left pleural effusion. Electronically Signed   By: Jeannine Boga M.D.   On: 11/15/2019 01:00   MR BRAIN WO CONTRAST  Result Date: 11/13/2019 CLINICAL DATA:  Stroke-like symptoms EXAM: MRI HEAD WITHOUT CONTRAST TECHNIQUE: Multiplanar, multiecho pulse sequences of the brain and surrounding structures were obtained without intravenous contrast. COMPARISON:  04/30/2014 FINDINGS: Brain: Confluent restricted diffusion in the right occipital and posteromedial temporal lobes with small acute infarct at the right thalamus. Petechial hemorrhage is seen at the level of the cortical infarct. Advanced brain atrophy, especially at the temporal and  parietal lobes. There has been prior infarct at the bilateral parietal lobes. No acute hemorrhage, hydrocephalus, or masslike finding. Vascular: Major flow voids are preserved Skull and upper cervical spine: No focal marrow lesion. Sinuses/Orbits: Bilateral cataract resection IMPRESSION: 1. Acute right PCA territory infarct with petechial hemorrhage. 2. Advanced brain atrophy correlating with history of Alzheimer's disease. Electronically Signed   By: Monte Fantasia M.D.   On: 11/13/2019 05:55    Assessment/Plan Pressure ulcer of left heel Pressure reduction, apply hydrocolloid dressing q 3 days.   Alzheimer disease ([place]) Continue Donepezil.   Stroke [facility]) Left sided weakness, continue Eliquis.   Hypertension Sbp in 160s, denied headache, dizziness, change of vision, chest pain, SOB, and palpitation, continue HCTZ, observe.   Atrial fibrillation (HCC) Heart rate is in control, continue Metoprolol.   Mild renal insufficiency [id number] 11/25/19    Family/ staff Communication: plan of care reviewed with the patient and charge nurse.   Labs/tests ordered:  none  Time spend 25 minutes.

## 2019-11-28 NOTE — Assessment & Plan Note (Addendum)
Sbp in 160s, denied headache, dizziness, change of vision, chest pain, SOB, and palpitation, continue HCTZ, observe.

## 2019-11-28 NOTE — Assessment & Plan Note (Addendum)
Pressure reduction, apply hydrocolloid dressing q 3 days.

## 2019-11-28 NOTE — Assessment & Plan Note (Signed)
Heart rate is in control, continue Metoprolol  

## 2019-11-28 NOTE — Assessment & Plan Note (Signed)
Left sided weakness, continue Eliquis.

## 2019-11-28 NOTE — Assessment & Plan Note (Signed)
Continue Donepezil 

## 2019-11-28 NOTE — Assessment & Plan Note (Signed)
[id number] 11/25/19

## 2019-12-23 ENCOUNTER — Encounter: Payer: Self-pay | Admitting: Adult Health

## 2019-12-23 ENCOUNTER — Other Ambulatory Visit: Payer: Self-pay

## 2019-12-23 ENCOUNTER — Ambulatory Visit (INDEPENDENT_AMBULATORY_CARE_PROVIDER_SITE_OTHER): Payer: Medicare Other | Admitting: Adult Health

## 2019-12-23 VITALS — BP 135/71 | HR 46 | Ht 61.0 in | Wt 170.0 lb

## 2019-12-23 DIAGNOSIS — I10 Essential (primary) hypertension: Secondary | ICD-10-CM | POA: Diagnosis not present

## 2019-12-23 DIAGNOSIS — F0280 Dementia in other diseases classified elsewhere without behavioral disturbance: Secondary | ICD-10-CM

## 2019-12-23 DIAGNOSIS — E785 Hyperlipidemia, unspecified: Secondary | ICD-10-CM

## 2019-12-23 DIAGNOSIS — I4811 Longstanding persistent atrial fibrillation: Secondary | ICD-10-CM | POA: Diagnosis not present

## 2019-12-23 DIAGNOSIS — G301 Alzheimer's disease with late onset: Secondary | ICD-10-CM

## 2019-12-23 DIAGNOSIS — I63531 Cerebral infarction due to unspecified occlusion or stenosis of right posterior cerebral artery: Secondary | ICD-10-CM | POA: Diagnosis not present

## 2019-12-23 DIAGNOSIS — I6521 Occlusion and stenosis of right carotid artery: Secondary | ICD-10-CM

## 2019-12-23 NOTE — Progress Notes (Signed)
[facility] [address] [place]. [place] [address] [phone]       HOSPITAL FOLLOW UP NOTE  [name] Date of Birth:  10-Mar-1928 Medical Record Number:  [id number]   Reason for Referral:  hospital stroke follow up    SUBJECTIVE:   CHIEF COMPLAINT:  Chief Complaint  Patient presents with  . Follow-up    hospital fu, rm 9, with son, and daughter on phone, son reports some improvements, would like to discuss blood thinners     HPI:   [name] is a 86 y.o. female with history of Alzheimer dementia, atrial fibrillation (not on anticoagulation) and HTN presented on 11/12/2019 with slurred speech, L sided weakness, L facial droop and difficulty standing.  Stroke work-up revealed right PCA large infarct embolic secondary to AF not on AC.  On Pradaxa in the past but discontinued due to fall risk.  Recommended initiating Eliquis 5 mg twice daily for secondary stroke prevention.  History of HTN stable.  Other stroke risk factors include advanced age but no prior stroke history.  Baseline Alzheimer's dementia on Aricept with depression.  Residual deficits of moderate dysarthria, generalized weakness, left-sided neglect with right gaze preference and discharged to SNF for therapy needs.  Stroke:   R PCA large infarct embolic secondary to AF not on [facility]  MRI  R PCA infarct w/ petechial hemorrhage. Advanced atrophy.   CTA head & neck negative for LVO, 50% stenosis of proximal R ICA, atheromatous plaque of carotid siphons with mild-mod narrowing however these are anatomically not likely of being the etiology of her R PCA infarct.  2D Echo 10/2019 EF 60-65%. No source of embolus. LA moderate dilated  LDL 68    HgbA1c 5.3  SCDs for VTE prophylaxis  aspirin 325 mg daily prior to admission, now on aspirin 325 mg daily. Pt was on pradaxa in the past, but not on PTA due to fall risk. Discussed with son, he is OK with restarting AC. Will recommend eliquis 5mg   bid in one week (11/21/19), continue ASA 325 at meantime.   Therapy recommendations:  SNF   Disposition:   SNF  Today, 12/23/2019, Jasmine Woodward is being seen for hospital follow-up accompanied by her son in office and her daughter via telephone.  [facility] with residual left hemianopia, gait impairment and moderate dysarthria.  Recently completed PT and will be completing OT in the near future.  Currently ambulating with rolling walker without any falls.  Baseline dementia stable without worsening of stroke.  No behavioral concerns.  Long-term goal is for her to return to assisted living.  She has continued on Eliquis 5 mg twice daily without bleeding or bruising.  Continues on atorvastatin 20 mg daily on myalgias.  Blood pressure today 135/71.  No concerns at this time.       ROS:   Unable to obtain due to cognitive impairment  PMH:  Past Medical History:  Diagnosis Date  . Alzheimer disease (HCC)   . Atrial fibrillation (HCC)   . Hypertension     PSH: No past surgical history on file.  Social History:  Social History   Socioeconomic History  . Marital status: Single    Spouse name: Not on file  . Number of children: Not on file  . Years of education: Not on file  . Highest education level: Not on file  Occupational History  . Not on file  Tobacco  Use  . Smoking status: Never Smoker  . Smokeless tobacco: Never Used  Vaping Use  . Vaping Use: Unknown  Substance and Sexual Activity  . Alcohol use: Not Currently  . Drug use: Never  . Sexual activity: Not Currently  Other Topics Concern  . Not on file  Social History Narrative   ** Merged History Encounter **       Social Determinants of Health   Financial Resource Strain:   . Difficulty of Paying Living Expenses:   Food Insecurity:   . Worried About Programme researcher, broadcasting/film/video in the Last Year:   . Barista in the Last Year:   Transportation Needs:   . Automotive engineer (Medical):   Marland Kitchen Lack of Transportation (Non-Medical):   Physical Activity:   . Days of Exercise per Week:   . Minutes of Exercise per Session:   Stress:   . Feeling of Stress :   Social Connections:   . Frequency of Communication with Friends and Family:   . Frequency of Social Gatherings with Friends and Family:   . Attends Religious Services:   . Active Member of Clubs or Organizations:   . Attends Banker Meetings:   Marland Kitchen Marital Status:   Intimate Partner Violence:   . Fear of Current or Ex-Partner:   . Emotionally Abused:   Marland Kitchen Physically Abused:   . Sexually Abused:     Family History: No family history on file.  Medications:   Current Outpatient Medications on File Prior to Visit  Medication Sig Dispense Refill  . apixaban (ELIQUIS) 5 MG TABS tablet Take 5 mg by mouth 2 (two) times daily.    . bimatoprost (LUMIGAN) 0.01 % SOLN Place 1 drop into both eyes at bedtime.     . Calcium Carb-Cholecalciferol (CALCIUM 600 + D PO) Take 1 tablet by mouth in the morning and at bedtime.    . donepezil (ARICEPT) 5 MG tablet Take 5 mg by mouth at bedtime.    . hydrochlorothiazide (HYDRODIURIL) 25 MG tablet Take 25 mg by mouth daily.    . indapamide (LOZOL) 2.5 MG tablet Take 2.5 mg by mouth daily.    . Metoprolol Succinate 50 MG CS24 Take 50 mg by mouth daily. Hold if Systolic is < 95 or if pulse is less than 60    . Multiple Vitamins-Minerals (CENTRUM SILVER PO) Take 1 tablet by mouth daily. 432mcg-300mcg.     . triamcinolone cream (KENALOG) 0.1 % Apply 1 application topically 2 (two) times daily as needed.    . zinc oxide 20 % ointment Apply 1 application topically as needed for irritation.     No current facility-administered medications on file prior to visit.    Allergies:   Allergies  Allergen Reactions  . Bee Venom     Bee Stings.      OBJECTIVE:  Physical Exam  Vitals:   12/23/19 1522  BP: 135/71  Pulse: (!) 46  Weight: 170 lb (77.1 kg)    Height: 5\' 1"  (1.549 m)   Body mass index is 32.12 kg/m. No exam data present  General: well developed, well nourished, seated, in no evident distress Head: head normocephalic and atraumatic.   Neck: supple with no carotid or supraclavicular bruits Cardiovascular: irregular rate and rhythm, no murmurs Musculoskeletal: no deformity Skin:  no rash/petichiae Vascular:  Normal pulses all extremities   Neurologic Exam Mental Status: Awake and fully alert.  Mild to moderate dysarthria.  Disoriented to place and time. Recent and remote memory diminished. Attention span, concentration and fund of knowledge impaired. Mood and affect appropriate.  Follows simple step commands without difficulty. Cranial Nerves: Fundoscopic exam unable to complete due to cognitive impairment.  Pupils equal, briskly reactive to light. Extraocular movements full without nystagmus.  Does not blink to threat on left side but blinks to threat on right side.  Hearing intact. Facial sensation intact. Face, tongue, palate moves normally and symmetrically.  Motor: Normal bulk and tone. Normal strength in all tested extremity muscles. Sensory.: intact to touch , pinprick , position and vibratory sensation.  Coordination: Rapid alternating movements normal in all extremities. Finger-to-nose and heel-to-shin difficulty performing due to cognition Gait and Station: Deferred as rolling walker not present during visit Reflexes: 1+ and symmetric. Toes downgoing.     NIHSS 6 1a.  Level of consciousness 0 1b. LOC questions 2 1c. LOC commands 1 2.  Best gaze 0 3.  Visual 2 4.  Facial palsy 0 5a.  Motor arm-left 0 5b.  Motor arm-right 0 6a.  Motor leg-left 0 6b.  Motor leg-right 0 7.  Limb ataxia 0 8.  Sensory 0 9.  Best language 0 10.  Dysarthria 1 11.  Extinction and inattention 0 Modified Rankin  4 CHA2DS2-VASc Score  6  (?2 oral anticoagulation recommended) Age in Years:  >75 +2    Sex:  female +1 Hypertension  History:  Yes +1  Diabetes Mellitus:  No 0  Congestive Heart Failure History:  No 0             Vascular Disease History (prior MI, aortic arthrosclerosis, PAD):  No 0                       Stroke/TIA/Thromboembolism History: Yes +2 HAS-BLED 3     ASSESSMENT: Jasmine Woodward is a 86 y.o. year old female presented with slurred speech, left-sided weakness, left facial droop and gait impairment on 11/12/2019 with stroke work-up revealing right PCA large infarct embolic secondary to known A. fib not on AC.  On Pradaxa in the past but discontinued due to fall risk with small SDH post fall 12/2018.  Eliquis initiated.  Vascular risk factors include HTN, A. fib, and right ICA stenosis.      PLAN:  1. Right PCA stroke:  -Residual deficits: Gait impairment, moderate dysarthria and likely left hemianopia.  Baseline dementia stable without worsening.  Recommend continued participation in therapy for ongoing improvement and use of rolling walker at all times for fall prevention -Continue Eliquis (apixaban) daily  and atorvastatin 20 mg daily for secondary stroke prevention. -Maintain strict control of hypertension with blood pressure goal below 130/90, diabetes with hemoglobin A1c goal below 6.5% and cholesterol with LDL cholesterol (bad cholesterol) goal below 70 mg/dL.  I also advised the patient to eat a healthy diet with plenty of whole grains, cereals, fruits and vegetables, exercise regularly with at least 30 minutes of continuous activity daily and maintain ideal body weight. 2. HTN: Stable.  Continue to follow with PCP for monitoring management 3. HLD: Recent LDL satisfactory.  Continue atorvastatin 20 mg daily for secondary stroke prevention monitored, managed and prescribed by PCP 4. Atrial fibrillation: Continuation of Eliquis and recommend scheduling follow-up with established cardiologist we will continue to prescribe, monitor and manage 5. Right ICA stenosis: Asymptomatic.  Defer surveillance  monitoring to cardiology 6. Dementia, Alzheimer's type: Currently managed by PCP    Follow up in  4 months or call earlier if needed   I spent 45 minutes of face-to-face and non-face-to-face time with patient, son and daughter.  This included previsit chart review, lab review, study review, order entry, electronic health record documentation, patient education regarding recent stroke, residual deficits, importance of managing stroke risk factors and answered all questions to patient satisfaction     Ihor AustinJessica McCue, South Lake HospitalGNP-BC  [facility] [address] [place], [place] [id number]  Phone [phone] Fax [phone] Note: This document was prepared with digital dictation and possible smart phrase technology. Any transcriptional errors that result from this process are unintentional.

## 2019-12-23 NOTE — Patient Instructions (Addendum)
Continue to work with therapies at facility for ongoing improvement  Continue Eliquis (apixaban) daily  and atorvastatin  for secondary stroke prevention  Continue to follow up with PCP regarding cholesterol and blood pressure management   Schedule follow up with cardiology for atrial fibrillation and for follow up of carotid stenosis  Continue to monitor blood pressure at home  Maintain strict control of hypertension with blood pressure goal below 130/90, diabetes with hemoglobin A1c goal below 6.5% and cholesterol with LDL cholesterol (bad cholesterol) goal below 70 mg/dL. I also advised the patient to eat  a                                                                                   healthy diet with plenty of whole grains, cereals, fruits and vegetables, exercise regularly and maintain ideal body weight.  Followup in the future with me in 4 months or call earlier if needed       Thank you for coming to see [place] at [facility]. I hope we have been able to provide you high quality care today.  You may receive a patient satisfaction survey over the next few weeks. We would appreciate your feedback and comments so that we may continue to improve ourselves and the health of our patients.

## 2019-12-23 NOTE — Progress Notes (Signed)
I agree with the above plan 

## 2020-01-07 ENCOUNTER — Non-Acute Institutional Stay (SKILLED_NURSING_FACILITY): Payer: Medicare Other | Admitting: Nurse Practitioner

## 2020-01-07 ENCOUNTER — Encounter: Payer: Self-pay | Admitting: Nurse Practitioner

## 2020-01-07 DIAGNOSIS — I10 Essential (primary) hypertension: Secondary | ICD-10-CM

## 2020-01-07 DIAGNOSIS — F0280 Dementia in other diseases classified elsewhere without behavioral disturbance: Secondary | ICD-10-CM | POA: Diagnosis not present

## 2020-01-07 DIAGNOSIS — L89622 Pressure ulcer of left heel, stage 2: Secondary | ICD-10-CM

## 2020-01-07 DIAGNOSIS — I63431 Cerebral infarction due to embolism of right posterior cerebral artery: Secondary | ICD-10-CM | POA: Diagnosis not present

## 2020-01-07 DIAGNOSIS — G301 Alzheimer's disease with late onset: Secondary | ICD-10-CM | POA: Diagnosis not present

## 2020-01-07 DIAGNOSIS — I480 Paroxysmal atrial fibrillation: Secondary | ICD-10-CM | POA: Diagnosis not present

## 2020-01-07 NOTE — Progress Notes (Signed)
Location:    [facility] Room Number: 7 Place of Service:  SNF (31) Provider:  Karlen Barbar, Arna Snipe NP   Georgianne Fick, MD  Patient Care Team: Georgianne Fick, MD as PCP - General (Internal Medicine)  Extended Emergency Contact Information Primary Emergency Contact: Marval Regal Address: [address]          [place], [place] [address] [place] Home Phone: [phone] Relation: Son  Code Status:  DNR Goals of care: Advanced Directive information Advanced Directives 11/21/2019  Does Patient Have a Medical Advance Directive? Yes  Type of Estate agent of [place];Out of facility DNR (pink MOST or yellow form);Living will  Does patient want to make changes to medical advance directive? No - Patient declined  Copy of Healthcare Power of Attorney in Chart? Yes - validated most recent copy scanned in chart (See row information)  Would patient like information on creating a medical advance directive? -  Pre-existing out of facility DNR order (yellow form or pink MOST form) Pink MOST form placed in chart (order not valid for inpatient use)     Chief Complaint  Patient presents with  . Medical Management of Chronic Issues    HPI:  Pt is a 86 y.o. female seen today for medical management of chronic diseases.     Hx of acute CVA with left side weakness, on Eliquis, Atorvastatin, SNF [facility] for therapy.             Hx of Alzheimer's dementia,on Donepezil 5mg  qd.   HTN/trace edema BLE, on HCTZ 25mg  qd, Metoprolol 50mg  qd.   AFib, heart rate is in control, continue Metoprolol, Eliquis 5mg  bid.   Pressure ulcers L heel mostly, scabbed over.   Past Medical History:  Diagnosis Date  . Alzheimer disease (HCC)   . Atrial fibrillation (HCC)   . Hypertension    History reviewed. No pertinent surgical history.  Allergies  Allergen Reactions  . Bee Venom     Bee Stings.    Allergies as of 01/07/2020      Reactions    Bee Venom    Bee Stings.      Medication List       Accurate as of January 07, 2020 11:59 PM. If you have any questions, ask your nurse or doctor.        STOP taking these medications   indapamide 2.5 MG tablet Commonly known as: LOZOL Stopped by: Claude Swendsen X Darik Massing, NP     TAKE these medications   atorvastatin 20 MG tablet Commonly known as: LIPITOR Take 20 mg by mouth daily.   bimatoprost 0.01 % Soln Commonly known as: LUMIGAN Place 1 drop into both eyes at bedtime.   CALCIUM 600 + D PO Take 1 tablet by mouth in the morning and at bedtime.   CENTRUM SILVER PO Take 1 tablet by mouth daily. 474mcg-300mcg.   donepezil 5 MG tablet Commonly known as: ARICEPT Take 5 mg by mouth at bedtime.   Eliquis 5 MG Tabs tablet Generic drug: apixaban Take 5 mg by mouth 2 (two) times daily.   hydrochlorothiazide 25 MG tablet Commonly known as: HYDRODIURIL Take 25 mg by mouth daily.   Metoprolol Succinate 50 MG Cs24 Take 50 mg by mouth daily. Hold if Systolic is < 95 or if pulse is less than 60   Pro-Stat Liqd Take 30 mLs by mouth every morning.   triamcinolone cream 0.1 % Commonly known as: KENALOG Apply 1 application topically  2 (two) times daily as needed.   zinc oxide 20 % ointment Apply 1 application topically as needed for irritation.       Review of Systems  Constitutional: Negative for activity change, fever and unexpected weight change.  HENT: Positive for hearing loss. Negative for congestion, trouble swallowing and voice change.   Respiratory: Negative for cough, shortness of breath and wheezing.   Cardiovascular: Positive for leg swelling. Negative for chest pain and palpitations.  Gastrointestinal: Negative for abdominal pain and constipation.  Genitourinary: Negative for difficulty urinating and dysuria.  Musculoskeletal: Positive for gait problem.  Skin: Negative for color change and pallor.       Left heal pressure ulcer is scabbed over.   Neurological:  Positive for speech difficulty and weakness. Negative for tremors, light-headedness and headaches.       Dementia  Psychiatric/Behavioral: Negative for hallucinations and sleep disturbance. The patient is not nervous/anxious.     Immunization History  Administered Date(s) Administered  . Influenza-Unspecified 04/02/2014, 04/16/2015, 04/01/2016, 04/14/2017  . Moderna SARS-COVID-2 Vaccination 06/17/2019, 07/15/2019  . Pneumococcal Conjugate-13 03/24/2014  . Pneumococcal Polysaccharide-23 02/26/2009  . Tdap 03/08/2012, 12/23/2018  . Zoster 09/03/2007   Pertinent  Health Maintenance Due  Topic Date Due  . URINE MICROALBUMIN  Never done  . DEXA SCAN  Never done  . INFLUENZA VACCINE  01/12/2020  . PNA vac Low Risk Adult  Completed   No flowsheet data found. Functional Status Survey:    Vitals:   01/07/20 1547  BP: (!) 130/84  Pulse: 67  Resp: 18  Temp: (!) 97.2 F (36.2 C)  SpO2: 95%  Weight: 174 lb 14.4 oz (79.3 kg)  Height: 5\' 1"  (1.549 m)   Body mass index is 33.05 kg/m. Physical Exam Vitals and nursing note reviewed.  Constitutional:      Appearance: Normal appearance.  HENT:     Head: Normocephalic and atraumatic.     Mouth/Throat:     Mouth: Mucous membranes are moist.  Eyes:     Extraocular Movements: Extraocular movements intact.     Conjunctiva/sclera: Conjunctivae normal.     Pupils: Pupils are equal, round, and reactive to light.  Cardiovascular:     Rate and Rhythm: Normal rate and regular rhythm.     Heart sounds: No murmur heard.   Pulmonary:     Effort: Pulmonary effort is normal.     Breath sounds: No rales.  Abdominal:     General: Bowel sounds are normal.     Palpations: Abdomen is soft.     Tenderness: There is no abdominal tenderness.  Musculoskeletal:     Cervical back: Normal range of motion and neck supple.     Right lower leg: Edema present.     Left lower leg: Edema present.     Comments: Trace edema BLE  Skin:    General: Skin is  warm and dry.     Comments: Left heel open area is scabbed over.   Neurological:     General: No focal deficit present.     Mental Status: She is alert. Mental status is at baseline.     Comments: Oriented to person  Psychiatric:        Mood and Affect: Mood normal.        Behavior: Behavior normal.     Labs reviewed: Recent Labs    11/12/19 2259 11/14/19 0350 11/25/19 0000  NA 140 136 138  K 4.4 4.3 4.0  CL 101 102 101  CO2 27 27 27*  GLUCOSE 104* 132*  --   BUN 27* 19 27*  CREATININE 1.18* 1.15* 1.2*  CALCIUM 9.3 8.5* 8.8   Recent Labs    05/16/19 0000 11/25/19 0000  AST 19 18  ALT 15 16  ALKPHOS 86 95  ALBUMIN 3.8  --    Recent Labs    11/12/19 2259 11/25/19 0000  WBC 7.7 9.6  HGB 11.6* 12.6  HCT 35.5* 37  MCV 92.9  --   PLT 239 338   Lab Results  Component Value Date   TSH 1.75 11/25/2019   Lab Results  Component Value Date   HGBA1C 5.3 11/13/2019   Lab Results  Component Value Date   CHOL 141 11/13/2019   HDL 64 11/13/2019   LDLCALC 68 11/13/2019   TRIG 46 11/13/2019   CHOLHDL 2.2 11/13/2019    Significant Diagnostic Results in last 30 days:  No results found.  Assessment/Plan Atrial fibrillation (HCC) Heart rate is in control, continue Metoprolol, Eliquis.   Hypertension Blood pressure is controlled, continue HCTZ, Metoprolol.   Alzheimer disease (HCC) Continue Donepezil for memory.   Stroke [facility]) Residual of the left sided weakness. Continue Atorvastatin, Eliquis.   Pressure ulcer of left heel Scabbed over.      Family/ staff Communication: plan of care reviewed with the patient and charge nurse.   Labs/tests ordered:  none  Time spend 25 minutes.

## 2020-01-07 NOTE — Assessment & Plan Note (Signed)
Heart rate is in control, continue Metoprolol, Eliquis.  

## 2020-01-07 NOTE — Assessment & Plan Note (Signed)
Scabbed over. ?

## 2020-01-07 NOTE — Assessment & Plan Note (Signed)
Blood pressure is controlled, continue HCTZ, Metoprolol.  

## 2020-01-07 NOTE — Assessment & Plan Note (Signed)
Continue Donepezil for memory.

## 2020-01-07 NOTE — Assessment & Plan Note (Addendum)
Residual of the left sided weakness. Continue Atorvastatin, Eliquis.

## 2020-01-12 ENCOUNTER — Encounter: Payer: Self-pay | Admitting: Nurse Practitioner

## 2020-01-28 ENCOUNTER — Encounter: Payer: Self-pay | Admitting: Nurse Practitioner

## 2020-01-28 ENCOUNTER — Non-Acute Institutional Stay (SKILLED_NURSING_FACILITY): Payer: Medicare Other | Admitting: Nurse Practitioner

## 2020-01-28 DIAGNOSIS — F0280 Dementia in other diseases classified elsewhere without behavioral disturbance: Secondary | ICD-10-CM

## 2020-01-28 DIAGNOSIS — N1832 Chronic kidney disease, stage 3b: Secondary | ICD-10-CM

## 2020-01-28 DIAGNOSIS — G301 Alzheimer's disease with late onset: Secondary | ICD-10-CM | POA: Diagnosis not present

## 2020-01-28 DIAGNOSIS — I10 Essential (primary) hypertension: Secondary | ICD-10-CM

## 2020-01-28 DIAGNOSIS — I63431 Cerebral infarction due to embolism of right posterior cerebral artery: Secondary | ICD-10-CM | POA: Diagnosis not present

## 2020-01-28 DIAGNOSIS — I480 Paroxysmal atrial fibrillation: Secondary | ICD-10-CM

## 2020-01-28 NOTE — Assessment & Plan Note (Signed)
TSH 1.75 11/25/19, Hx of acute CVA with left side weakness since 11/2019, on Eliquis, Atorvastatin, SNF [facility] for therapy.

## 2020-01-28 NOTE — Assessment & Plan Note (Signed)
Blood pressure is controlled, continue HCTZ, Metoprolol, trace edema BLE noted.

## 2020-01-28 NOTE — Assessment & Plan Note (Signed)
No behavioral issues, continue Donepezil.

## 2020-01-28 NOTE — Assessment & Plan Note (Signed)
Heart rate is in control, continue Metoprolol, Eliquis.  

## 2020-01-28 NOTE — Progress Notes (Signed)
Location:    [facility] Room Number: 7 Place of Service:  SNF (31) Provider: Chipper Oman NP  Georgianne Fick, MD  Patient Care Team: Georgianne Fick, MD as PCP - General (Internal Medicine)  Extended Emergency Contact Information Primary Emergency Contact: Marval Regal Address: [address]          [place], [place] [address] [place] Home Phone: [phone] Relation: Son  Code Status:  DNR Goals of care: Advanced Directive information Advanced Directives 11/21/2019  Does Patient Have a Medical Advance Directive? Yes  Type of Estate agent of [place];Out of facility DNR (pink MOST or yellow form);Living will  Does patient want to make changes to medical advance directive? No - Patient declined  Copy of Healthcare Power of Attorney in Chart? Yes - validated most recent copy scanned in chart (See row information)  Would patient like information on creating a medical advance directive? -  Pre-existing out of facility DNR order (yellow form or pink MOST form) Pink MOST form placed in chart (order not valid for inpatient use)     Chief Complaint  Patient presents with  . Medical Management of Chronic Issues    HPI:  Pt is a 86 y.o. female seen today for medical management of chronic diseases.    TSH 1.75 11/25/19, Hx of acute CVA with left side weakness, on Eliquis, Atorvastatin, SNF [facility] for therapy. Hx of Alzheimer's dementia,on Donepezil5mg  qd.              HTN/trace edema BLE, on HCTZ 25mg  qd, Metoprolol 50mg  qd.              AFib, heart rate is in control, continue Metoprolol, Eliquis 5mg  bid.             CKD, Bun/creat 27/1.2 11/25/19  Past Medical History:  Diagnosis Date  . Alzheimer disease (HCC)   . Atrial fibrillation (HCC)   . Hypertension    History reviewed. No pertinent surgical history.  Allergies  Allergen Reactions  . Bee Venom     Bee Stings.    Allergies as of  01/28/2020      Reactions   Bee Venom    Bee Stings.      Medication List       Accurate as of January 28, 2020 11:59 PM. If you have any questions, ask your nurse or doctor.        atorvastatin 20 MG tablet Commonly known as: LIPITOR Take 20 mg by mouth daily.   bimatoprost 0.01 % Soln Commonly known as: LUMIGAN Place 1 drop into both eyes at bedtime.   CALCIUM 600 + D PO Take 1 tablet by mouth in the morning and at bedtime.   CENTRUM SILVER PO Take 1 tablet by mouth daily. 486mcg-300mcg.   donepezil 5 MG tablet Commonly known as: ARICEPT Take 5 mg by mouth at bedtime.   Eliquis 5 MG Tabs tablet Generic drug: apixaban Take 5 mg by mouth 2 (two) times daily.   hydrochlorothiazide 25 MG tablet Commonly known as: HYDRODIURIL Take 25 mg by mouth daily.   Metoprolol Succinate 50 MG Cs24 Take 50 mg by mouth daily. Hold if Systolic is < 95 or if pulse is less than 60   Pro-Stat Liqd Take 30 mLs by mouth every morning.   triamcinolone cream 0.1 % Commonly known as: KENALOG Apply 1 application topically 2 (two) times daily as needed.   zinc oxide 20 % ointment Apply  1 application topically as needed for irritation.       Review of Systems  Constitutional: Negative for fatigue, fever and unexpected weight change.  HENT: Positive for hearing loss. Negative for congestion and trouble swallowing.   Respiratory: Negative for cough, shortness of breath and wheezing.   Cardiovascular: Positive for leg swelling. Negative for chest pain and palpitations.  Gastrointestinal: Negative for abdominal pain and constipation.  Genitourinary: Negative for difficulty urinating and dysuria.  Musculoskeletal: Positive for gait problem.  Skin: Negative for color change.  Neurological: Positive for speech difficulty and weakness. Negative for tremors, light-headedness and headaches.       Dementia  Psychiatric/Behavioral: Negative for behavioral problems and sleep disturbance. The  patient is not nervous/anxious.     Immunization History  Administered Date(s) Administered  . Influenza-Unspecified 04/02/2014, 04/16/2015, 04/01/2016, 04/14/2017  . Moderna SARS-COVID-2 Vaccination 06/17/2019, 07/15/2019  . Pneumococcal Conjugate-13 03/24/2014  . Pneumococcal Polysaccharide-23 02/26/2009  . Tdap 03/08/2012, 12/23/2018  . Zoster 09/03/2007   Pertinent  Health Maintenance Due  Topic Date Due  . URINE MICROALBUMIN  Never done  . DEXA SCAN  Never done  . INFLUENZA VACCINE  01/12/2020  . PNA vac Low Risk Adult  Completed   No flowsheet data found. Functional Status Survey:    Vitals:   01/28/20 1614  BP: 128/70  Pulse: 96  Resp: 19  Temp: (!) 97.3 F (36.3 C)  SpO2: 98%  Weight: 175 lb 8 oz (79.6 kg)  Height: 5\' 1"  (1.549 m)   Body mass index is 33.16 kg/m. Physical Exam Vitals and nursing note reviewed.  Constitutional:      Appearance: Normal appearance.  HENT:     Head: Normocephalic and atraumatic.     Mouth/Throat:     Mouth: Mucous membranes are moist.  Eyes:     Extraocular Movements: Extraocular movements intact.     Conjunctiva/sclera: Conjunctivae normal.     Pupils: Pupils are equal, round, and reactive to light.  Cardiovascular:     Rate and Rhythm: Normal rate and regular rhythm.     Heart sounds: No murmur heard.   Pulmonary:     Effort: Pulmonary effort is normal.     Breath sounds: No rhonchi or rales.  Abdominal:     General: Bowel sounds are normal.     Palpations: Abdomen is soft.     Tenderness: There is no abdominal tenderness.  Musculoskeletal:     Cervical back: Normal range of motion and neck supple.     Right lower leg: Edema present.     Left lower leg: Edema present.     Comments: Trace edema BLE  Skin:    General: Skin is warm and dry.  Neurological:     General: No focal deficit present.     Mental Status: She is alert. Mental status is at baseline.     Gait: Gait abnormal.     Comments: Oriented to  person  Psychiatric:        Mood and Affect: Mood normal.        Behavior: Behavior normal.     Labs reviewed: Recent Labs    11/12/19 2259 11/14/19 0350 11/25/19 0000  NA 140 136 138  K 4.4 4.3 4.0  CL 101 102 101  CO2 27 27 27*  GLUCOSE 104* 132*  --   BUN 27* 19 27*  CREATININE 1.18* 1.15* 1.2*  CALCIUM 9.3 8.5* 8.8   Recent Labs    05/16/19 0000 11/25/19 0000  AST 19 18  ALT 15 16  ALKPHOS 86 95  ALBUMIN 3.8  --    Recent Labs    11/12/19 2259 11/25/19 0000  WBC 7.7 9.6  HGB 11.6* 12.6  HCT 35.5* 37  MCV 92.9  --   PLT 239 338   Lab Results  Component Value Date   TSH 1.75 11/25/2019   Lab Results  Component Value Date   HGBA1C 5.3 11/13/2019   Lab Results  Component Value Date   CHOL 141 11/13/2019   HDL 64 11/13/2019   LDLCALC 68 11/13/2019   TRIG 46 11/13/2019   CHOLHDL 2.2 11/13/2019    Significant Diagnostic Results in last 30 days:  No results found.  Assessment/Plan CKD (chronic kidney disease) stage 3, GFR 30-59 ml/min Chronic, Bun/creat 27/1.2 11/25/19  Atrial fibrillation (HCC) Heart rate is in control, continue Metoprolol, Eliquis.   Hypertension Blood pressure is controlled, continue HCTZ, Metoprolol, trace edema BLE noted.   Alzheimer disease (HCC) No behavioral issues, continue Donepezil.   Stroke (HCC) TSH 1.75 11/25/19, Hx of acute CVA with left side weakness since 11/2019, on Eliquis, Atorvastatin, SNF [facility] for therapy.    Family/ staff Communication: plan of care reviewed with the patient and charge nurse.   Labs/tests ordered:  none  Time spend 25 minutes.

## 2020-01-28 NOTE — Assessment & Plan Note (Signed)
Chronic, Bun/creat 27/1.2 11/25/19

## 2020-01-29 ENCOUNTER — Encounter: Payer: Self-pay | Admitting: Nurse Practitioner

## 2020-02-19 ENCOUNTER — Non-Acute Institutional Stay (SKILLED_NURSING_FACILITY): Payer: Medicare Other | Admitting: Internal Medicine

## 2020-02-19 ENCOUNTER — Encounter: Payer: Self-pay | Admitting: Internal Medicine

## 2020-02-19 DIAGNOSIS — I480 Paroxysmal atrial fibrillation: Secondary | ICD-10-CM | POA: Diagnosis not present

## 2020-02-19 DIAGNOSIS — R600 Localized edema: Secondary | ICD-10-CM | POA: Diagnosis not present

## 2020-02-19 DIAGNOSIS — F0280 Dementia in other diseases classified elsewhere without behavioral disturbance: Secondary | ICD-10-CM

## 2020-02-19 DIAGNOSIS — I63431 Cerebral infarction due to embolism of right posterior cerebral artery: Secondary | ICD-10-CM

## 2020-02-19 DIAGNOSIS — G301 Alzheimer's disease with late onset: Secondary | ICD-10-CM

## 2020-02-19 DIAGNOSIS — N1832 Chronic kidney disease, stage 3b: Secondary | ICD-10-CM | POA: Diagnosis not present

## 2020-02-19 DIAGNOSIS — I10 Essential (primary) hypertension: Secondary | ICD-10-CM

## 2020-02-19 NOTE — Progress Notes (Signed)
Location:    [facility] Room Number: 7 Place of Service:  SNF [phone]) Provider:  Einar Crow MD  Mahlon Gammon, MD  Patient Care Team: Mahlon Gammon, MD as PCP - General (Internal Medicine)  Extended Emergency Contact Information Primary Emergency Contact: Marval Regal Address: [address]          [place], [place] [address] [place] Home Phone: [phone] Relation: Son  Code Status:  DNR Goals of care: Advanced Directive information Advanced Directives 02/19/2020  Does Patient Have a Medical Advance Directive? Yes  Type of Estate agent of [place];Living will;Out of facility DNR (pink MOST or yellow form)  Does patient want to make changes to medical advance directive? No - Patient declined  Copy of Healthcare Power of Attorney in Chart? Yes - validated most recent copy scanned in chart (See row information)  Would patient like information on creating a medical advance directive? -  Pre-existing out of facility DNR order (yellow form or pink MOST form) Yellow form placed in chart (order not valid for inpatient use);Pink MOST form placed in chart (order not valid for inpatient use)     Chief Complaint  Patient presents with  . Medical Management of Chronic Issues  . Health Maintenance    Urine microabumin, Dexa scan    HPI:  Pt is a 86 y.o. female seen today for medical management of chronic diseases.    Patient is a long-term care patient resident  She was admitted after she had a acute right PCA infarct.  Has been on Eliquis since then.  Also has a history of Alzheimer's dementia, hypertension, A. fib.  Her echo had a EF of 60 to 65% with LA dilated  Patient is stable in SNF.  She has gained almost 5 to 6 pounds.  Eating very well.  Has gained some strength in her left extremity.  Still very slow to respond unable to give much history due to her dementia.  Denies any shortness of breath or cough.   No  new nursing complaints Past Medical History:  Diagnosis Date  . Alzheimer disease (HCC)   . Atrial fibrillation (HCC)   . Hypertension    History reviewed. No pertinent surgical history.  Allergies  Allergen Reactions  . Bee Venom     Bee Stings.    Allergies as of 02/19/2020      Reactions   Bee Venom    Bee Stings.      Medication List       Accurate as of February 19, 2020 10:15 AM. If you have any questions, ask your nurse or doctor.        atorvastatin 20 MG tablet Commonly known as: LIPITOR Take 20 mg by mouth daily.   bimatoprost 0.01 % Soln Commonly known as: LUMIGAN Place 1 drop into both eyes at bedtime.   CALCIUM 600 + D PO Take 1 tablet by mouth in the morning and at bedtime.   CENTRUM SILVER PO Take 1 tablet by mouth daily. 467mcg-300mcg.   donepezil 5 MG tablet Commonly known as: ARICEPT Take 5 mg by mouth at bedtime.   Eliquis 5 MG Tabs tablet Generic drug: apixaban Take 5 mg by mouth 2 (two) times daily.   hydrochlorothiazide 25 MG tablet Commonly known as: HYDRODIURIL Take 25 mg by mouth daily.   Metoprolol Succinate 50 MG Cs24 Take 50 mg by mouth daily. Hold if Systolic is < 95 or if pulse  is less than 60   Pro-Stat Liqd Take 30 mLs by mouth every morning.   triamcinolone cream 0.1 % Commonly known as: KENALOG Apply 1 application topically 2 (two) times daily as needed.   zinc oxide 20 % ointment Apply 1 application topically as needed for irritation.       Review of Systems  Review of Systems  Constitutional: Negative for activity change, appetite change, chills, diaphoresis, fatigue and fever.  HENT: Negative for mouth sores, postnasal drip, rhinorrhea, sinus pain and sore throat.   Respiratory: Negative for apnea, cough, chest tightness, shortness of breath and wheezing.   Cardiovascular: Negative for chest pain, palpitations and leg swelling.  Gastrointestinal: Negative for abdominal distention, abdominal pain,  constipation, diarrhea, nausea and vomiting.  Genitourinary: Negative for dysuria and frequency.  Musculoskeletal: Negative for arthralgias, joint swelling and myalgias.  Skin: Negative for rash.  Neurological: Negative for dizziness, syncope, weakness, light-headedness and numbness.  Psychiatric/Behavioral: Negative for behavioral problems, confusion and sleep disturbance.     Immunization History  Administered Date(s) Administered  . Influenza-Unspecified 04/02/2014, 04/16/2015, 04/01/2016, 04/14/2017  . Moderna SARS-COVID-2 Vaccination 06/17/2019, 07/15/2019  . Pneumococcal Conjugate-13 03/24/2014  . Pneumococcal Polysaccharide-23 02/26/2009  . Tdap 03/08/2012, 12/23/2018  . Zoster 09/03/2007   Pertinent  Health Maintenance Due  Topic Date Due  . URINE MICROALBUMIN  Never done  . DEXA SCAN  Never done  . INFLUENZA VACCINE  01/12/2020  . PNA vac Low Risk Adult  Completed   No flowsheet data found. Functional Status Survey:    Vitals:   02/19/20 1009  BP: 128/78  Pulse: (!) 113  Resp: 20  Temp: 98.4 F (36.9 C)  SpO2: 99%  Weight: 179 lb (81.2 kg)  Height: 5\' 1"  (1.549 m)   Body mass index is 33.82 kg/m. Physical Exam  Constitutional: . Well-developed and well-nourished.  HENT:  Head: Normocephalic.  Mouth/Throat: Oropharynx is clear and moist.  Eyes: Pupils are equal, round, and reactive to light.  Neck: Neck supple.  Cardiovascular: Normal rate Irregular Rythm.  No murmur heard. Pulmonary/Chest: Effort normal and breath sounds normal. No respiratory distress. No wheezes. She has no rales.  Abdominal: Soft. Bowel sounds are normal. No distension. There is no tenderness. There is no rebound.  Musculoskeletal: Moderate Edema Bilateral Lymphadenopathy: none Neurological: Mild Weakness in All Extremities. No Focal Deficits Skin: Skin is warm and dry.  Psychiatric: Normal mood and affect. Behavior is normal. Thought content normal.    Labs reviewed: Recent  Labs    11/12/19 2259 11/14/19 0350 11/25/19 0000  NA 140 136 138  K 4.4 4.3 4.0  CL 101 102 101  CO2 27 27 27*  GLUCOSE 104* 132*  --   BUN 27* 19 27*  CREATININE 1.18* 1.15* 1.2*  CALCIUM 9.3 8.5* 8.8   Recent Labs    05/16/19 0000 11/25/19 0000  AST 19 18  ALT 15 16  ALKPHOS 86 95  ALBUMIN 3.8  --    Recent Labs    11/12/19 2259 11/25/19 0000  WBC 7.7 9.6  HGB 11.6* 12.6  HCT 35.5* 37  MCV 92.9  --   PLT 239 338   Lab Results  Component Value Date   TSH 1.75 11/25/2019   Lab Results  Component Value Date   HGBA1C 5.3 11/13/2019   Lab Results  Component Value Date   CHOL 141 11/13/2019   HDL 64 11/13/2019   LDLCALC 68 11/13/2019   TRIG 46 11/13/2019   CHOLHDL 2.2 11/13/2019  Significant Diagnostic Results in last 30 days:  No results found.  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

## 2020-02-27 DIAGNOSIS — N289 Disorder of kidney and ureter, unspecified: Secondary | ICD-10-CM | POA: Diagnosis not present

## 2020-02-27 DIAGNOSIS — E785 Hyperlipidemia, unspecified: Secondary | ICD-10-CM | POA: Diagnosis not present

## 2020-02-27 DIAGNOSIS — R6889 Other general symptoms and signs: Secondary | ICD-10-CM | POA: Diagnosis not present

## 2020-02-27 DIAGNOSIS — Z1329 Encounter for screening for other suspected endocrine disorder: Secondary | ICD-10-CM | POA: Diagnosis not present

## 2020-02-27 LAB — CBC AND DIFFERENTIAL
HCT: 31 — AB (ref 36–46)
Hemoglobin: 10.1 — AB (ref 12.0–16.0)
Neutrophils Absolute: 4193
Platelets: 243 (ref 150–399)
WBC: 6.5

## 2020-02-27 LAB — LIPID PANEL
Cholesterol: 89 (ref 0–200)
HDL: 46 (ref 35–70)
LDL Cholesterol: 30
LDl/HDL Ratio: 1.9
Triglycerides: 51 (ref 40–160)

## 2020-02-27 LAB — COMPREHENSIVE METABOLIC PANEL
Albumin: 3.2 — AB (ref 3.5–5.0)
Calcium: 8.6 — AB (ref 8.7–10.7)
Globulin: 1.9

## 2020-02-27 LAB — BASIC METABOLIC PANEL
BUN: 23 — AB (ref 4–21)
CO2: 29 — AB (ref 13–22)
Chloride: 103 (ref 99–108)
Creatinine: 1 (ref 0.5–1.1)
Glucose: 93
Potassium: 4.1 (ref 3.4–5.3)
Sodium: 140 (ref 137–147)

## 2020-02-27 LAB — HEPATIC FUNCTION PANEL
ALT: 9 (ref 7–35)
AST: 12 — AB (ref 13–35)
Alkaline Phosphatase: 81 (ref 25–125)
Bilirubin, Total: 0.6

## 2020-02-27 LAB — CBC: RBC: 3.33 — AB (ref 3.87–5.11)

## 2020-03-17 ENCOUNTER — Encounter: Payer: Self-pay | Admitting: Nurse Practitioner

## 2020-03-17 ENCOUNTER — Non-Acute Institutional Stay (SKILLED_NURSING_FACILITY): Payer: Medicare Other | Admitting: Nurse Practitioner

## 2020-03-17 DIAGNOSIS — I63431 Cerebral infarction due to embolism of right posterior cerebral artery: Secondary | ICD-10-CM

## 2020-03-17 DIAGNOSIS — I480 Paroxysmal atrial fibrillation: Secondary | ICD-10-CM | POA: Diagnosis not present

## 2020-03-17 DIAGNOSIS — F0280 Dementia in other diseases classified elsewhere without behavioral disturbance: Secondary | ICD-10-CM

## 2020-03-17 DIAGNOSIS — I10 Essential (primary) hypertension: Secondary | ICD-10-CM | POA: Diagnosis not present

## 2020-03-17 DIAGNOSIS — G309 Alzheimer's disease, unspecified: Secondary | ICD-10-CM

## 2020-03-17 DIAGNOSIS — N1832 Chronic kidney disease, stage 3b: Secondary | ICD-10-CM

## 2020-03-17 NOTE — Assessment & Plan Note (Signed)
No behavioral issues, continue supportive care in SNF [facility], continue Donepezil.

## 2020-03-17 NOTE — Assessment & Plan Note (Signed)
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  

## 2020-03-17 NOTE — Assessment & Plan Note (Signed)
Heart rate is in control, continue Metoprolol, Eliquis.  

## 2020-03-17 NOTE — Progress Notes (Signed)
Location:   [facility] Room Number: 7 Place of Service:  SNF (31) Provider: [facility] Isay Perleberg NP  Mahlon Gammon, MD  Patient Care Team: Mahlon Gammon, MD as PCP - General (Internal Medicine)  Extended Emergency Contact Information Primary Emergency Contact: Marval Regal Address: [address]          [place], [place] [address] [place] Home Phone: [phone] Relation: Son  Code Status:  DNR Goals of care: Advanced Directive information Advanced Directives 03/17/2020  Does Patient Have a Medical Advance Directive? Yes  Type of Advance Directive Out of facility DNR (pink MOST or yellow form);Living will;Healthcare Power of Attorney  Does patient want to make changes to medical advance directive? No - Patient declined  Copy of Healthcare Power of Attorney in Chart? Yes - validated most recent copy scanned in chart (See row information)  Would patient like information on creating a medical advance directive? -  Pre-existing out of facility DNR order (yellow form or pink MOST form) Pink MOST form placed in chart (order not valid for inpatient use);Yellow form placed in chart (order not valid for inpatient use)     Chief Complaint  Patient presents with  . Medical Management of Chronic Issues  . Health Maintenance    Urine mircroalbumin, Dexa    HPI:  Pt is a 86 y.o. female seen today for medical management of chronic diseases.      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 Hx of Alzheimer's dementia,on Donepezil5mg  qd.  HTN/trace edema BLE, takes Furosemide 20mg  qd,  Metoprolol 50mg  qd.  AFib, heart rate is in control, continue Metoprolol, Eliquis 5mg  bid.    CKD, Bun/creat 23/1.0 02/27/20  Past Medical History:  Diagnosis Date  . Alzheimer disease (HCC)   . Atrial fibrillation (HCC)   . Hypertension     History reviewed. No pertinent surgical history.  Allergies  Allergen Reactions  . Bee Venom     Bee Stings.    Allergies as of 03/17/2020      Reactions   Bee Venom    Bee Stings.      Medication List       Accurate as of March 17, 2020 11:59 PM. If you have any questions, ask your nurse or doctor.        STOP taking these medications   hydrochlorothiazide 25 MG tablet Commonly known as: HYDRODIURIL Stopped by: Recardo Linn X Brodey Bonn, NP   Pro-Stat Liqd Stopped by: Demitrios Molyneux X Princesa Willig, NP     TAKE these medications   atorvastatin 20 MG tablet Commonly known as: LIPITOR Take 10 mg by mouth daily.   bimatoprost 0.01 % Soln Commonly known as: LUMIGAN Place 1 drop into both eyes at bedtime.   CALCIUM 600 + D PO Take 1 tablet by mouth in the morning and at bedtime.   CENTRUM SILVER PO Take 1 tablet by mouth daily. 450mcg-300mcg.   donepezil 5 MG tablet Commonly known as: ARICEPT Take 5 mg by mouth at bedtime.   Eliquis 5 MG Tabs tablet Generic drug: apixaban Take 5 mg by mouth 2 (two) times daily.   furosemide 20 MG tablet Commonly known as: LASIX Take 20 mg by mouth daily.   Metoprolol Succinate 50 MG Cs24 Take 50 mg by mouth daily. Hold if Systolic is < 95 or if pulse is less than 60   triamcinolone cream 0.1 % Commonly  known as: KENALOG Apply 1 application topically 2 (two) times daily as needed.   zinc oxide 20 % ointment Apply 1 application topically as needed for irritation.       Review of Systems  Constitutional: Negative for activity change, fever and unexpected weight change.  HENT: Positive for hearing loss. Negative for congestion and trouble swallowing.   Respiratory: Negative for cough, shortness of breath and wheezing.   Cardiovascular: Positive for leg swelling. Negative for chest pain and palpitations.  Gastrointestinal: Negative for abdominal pain and constipation.  Genitourinary: Negative for difficulty urinating and dysuria.   Musculoskeletal: Positive for gait problem.  Skin: Negative for color change.  Neurological: Positive for speech difficulty and weakness. Negative for tremors, light-headedness and headaches.       Dementia  Psychiatric/Behavioral: Negative for behavioral problems and sleep disturbance. The patient is not nervous/anxious.     Immunization History  Administered Date(s) Administered  . Influenza-Unspecified 04/02/2014, 04/16/2015, 04/01/2016, 04/14/2017  . Moderna SARS-COVID-2 Vaccination 06/17/2019, 07/15/2019  . Pneumococcal Conjugate-13 03/24/2014  . Pneumococcal Polysaccharide-23 02/26/2009  . Tdap 03/08/2012, 12/23/2018  . Zoster 09/03/2007   Pertinent  Health Maintenance Due  Topic Date Due  . URINE MICROALBUMIN  Never done  . DEXA SCAN  Never done  . INFLUENZA VACCINE  01/12/2020  . PNA vac Low Risk Adult  Completed   No flowsheet data found. Functional Status Survey:    Vitals:   03/17/20 1134  BP: 138/78  Pulse: 96  Resp: 19  Temp: 97.6 F (36.4 C)  SpO2: 99%  Weight: 180 lb (81.6 kg)  Height: 5\' 1"  (1.549 m)   Body mass index is 34.01 kg/m. Physical Exam Vitals and nursing note reviewed.  Constitutional:      Appearance: Normal appearance.  HENT:     Head: Normocephalic and atraumatic.  Eyes:     Extraocular Movements: Extraocular movements intact.     Conjunctiva/sclera: Conjunctivae normal.     Pupils: Pupils are equal, round, and reactive to light.  Cardiovascular:     Rate and Rhythm: Normal rate and regular rhythm.     Heart sounds: No murmur heard.   Pulmonary:     Effort: Pulmonary effort is normal.     Breath sounds: No rhonchi or rales.  Abdominal:     General: Bowel sounds are normal.     Palpations: Abdomen is soft.     Tenderness: There is no abdominal tenderness.  Musculoskeletal:     Cervical back: Normal range of motion and neck supple.     Right lower leg: Edema present.     Left lower leg: Edema present.     Comments: Trace  edema BLE  Skin:    General: Skin is warm and dry.  Neurological:     General: No focal deficit present.     Mental Status: She is alert. Mental status is at baseline.     Gait: Gait abnormal.     Comments: Oriented to person  Psychiatric:        Mood and Affect: Mood normal.        Behavior: Behavior normal.     Labs reviewed: Recent Labs    11/12/19 2259 11/12/19 2259 11/14/19 0350 11/25/19 0000 02/27/20 0000  NA 140   < > 136 138 140  K 4.4   < > 4.3 4.0 4.1  CL 101   < > 102 101 103  CO2 27   < > 27 27* 29*  GLUCOSE 104*  --  132*  --   --   BUN 27*   < > 19 27* 23*  CREATININE 1.18*   < > 1.15* 1.2* 1.0  CALCIUM 9.3   < > 8.5* 8.8 8.6*   < > = values in this interval not displayed.   Recent Labs    05/16/19 0000 11/25/19 0000 02/27/20 0000  AST 19 18 12*  ALT 15 16 9   ALKPHOS 86 95 81  ALBUMIN 3.8  --  3.2*   Recent Labs    11/12/19 2259 11/25/19 0000 02/27/20 0000  WBC 7.7 9.6 6.5  NEUTROABS  --   --  4,193  HGB 11.6* 12.6 10.1*  HCT 35.5* 37 31*  MCV 92.9  --   --   PLT 239 338 243   Lab Results  Component Value Date   TSH 1.75 11/25/2019   Lab Results  Component Value Date   HGBA1C 5.3 11/13/2019   Lab Results  Component Value Date   CHOL 89 02/27/2020   HDL 46 02/27/2020   LDLCALC 30 02/27/2020   TRIG 51 02/27/2020   CHOLHDL 2.2 11/13/2019    Significant Diagnostic Results in last 30 days:  No results found.  Assessment/Plan Stroke [facility]) Hx of acute CVA-embolism of right posterior cerebral artery,  with left side weakness, on Eliquis, Atorvastatin, SNF [facility] for therapy.   Alzheimer disease (HCC) No behavioral issues, continue supportive care in SNF [facility], continue Donepezil.   Hypertension Blood pressure is controlled, continue Metoprolol, Furosemide in setting of edema BLE.   Atrial fibrillation (HCC) Heart rate is in control, continue Metoprolol, Eliquis.   CKD (chronic kidney disease) stage 3, GFR 30-59 ml/min Stable,  Bun/creat 23/1.0 02/27/20   Family/ staff Communication: plan of care reviewed with the patient and charge nurse.   Labs/tests ordered:  none  Time spend 35 minutes.

## 2020-03-17 NOTE — Assessment & Plan Note (Signed)
Stable, Bun/creat 23/1.0 02/27/20

## 2020-03-17 NOTE — Assessment & Plan Note (Signed)
Blood pressure is controlled, continue Metoprolol, Furosemide in setting of edema BLE.

## 2020-04-17 ENCOUNTER — Non-Acute Institutional Stay (SKILLED_NURSING_FACILITY): Payer: Medicare Other | Admitting: Nurse Practitioner

## 2020-04-17 ENCOUNTER — Encounter: Payer: Self-pay | Admitting: Nurse Practitioner

## 2020-04-17 DIAGNOSIS — G309 Alzheimer's disease, unspecified: Secondary | ICD-10-CM | POA: Diagnosis not present

## 2020-04-17 DIAGNOSIS — I10 Essential (primary) hypertension: Secondary | ICD-10-CM

## 2020-04-17 DIAGNOSIS — F0280 Dementia in other diseases classified elsewhere without behavioral disturbance: Secondary | ICD-10-CM

## 2020-04-17 DIAGNOSIS — I63431 Cerebral infarction due to embolism of right posterior cerebral artery: Secondary | ICD-10-CM | POA: Diagnosis not present

## 2020-04-17 DIAGNOSIS — I480 Paroxysmal atrial fibrillation: Secondary | ICD-10-CM

## 2020-04-17 NOTE — Assessment & Plan Note (Signed)
No behavioral issues, continue supportive care in SNF [facility], continue Donepezil for memory.

## 2020-04-17 NOTE — Assessment & Plan Note (Signed)
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

## 2020-04-17 NOTE — Assessment & Plan Note (Signed)
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  

## 2020-04-17 NOTE — Assessment & Plan Note (Signed)
Afib, heart rate is in control, takes Metoprolol, Eliquis.

## 2020-04-17 NOTE — Progress Notes (Signed)
Location:   SNF [facility] Room Number: 7-A Place of Service:  SNF (31) Provider: [facility] NP  Mahlon Gammon, MD  Patient Care Team: Mahlon Gammon, MD as PCP - General (Internal Medicine)  Extended Emergency Contact Information Primary Emergency Contact: Marval Regal Address: [address]          [place], [place] [address] [place] of [place] Home Phone: [phone] Relation: Son  Code Status:  DNR Goals of care: Advanced Directive information Advanced Directives 04/17/2020  Does Patient Have a Medical Advance Directive? Yes  Type of Advance Directive Out of facility DNR (pink MOST or yellow form)  Does patient want to make changes to medical advance directive? No - Patient declined  Copy of Healthcare Power of Attorney in Chart? -  Would patient like information on creating a medical advance directive? -  Pre-existing out of facility DNR order (yellow form or pink MOST form) Yellow form placed in chart (order not valid for inpatient use);Pink MOST form placed in chart (order not valid for inpatient use)     Chief Complaint  Patient presents with  . Medical Management of Chronic Issues    Routine [facility] visit  . Quality Metric Gaps    Urine microalbumin, DEXA    HPI:  Pt is a 86 y.o. female seen today for medical management of chronic diseases.    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 Hx of Alzheimer's dementia,on Donepezil5mg  qd.  CKD, Bun/creat 23/1.0 02/27/20  Afib, heart rate is in control, takes Metoprolol, Eliquis.   Past Medical History:  Diagnosis Date  . Alzheimer disease (HCC)   . Atrial fibrillation (HCC)   . Hypertension    History reviewed. No pertinent surgical history.  Allergies  Allergen Reactions  . Bee Venom     Bee Stings.    Allergies as of 04/17/2020      Reactions   Bee Venom     Bee Stings.      Medication List       Accurate as of April 17, 2020 11:59 PM. If you have any questions, ask your nurse or doctor.        atorvastatin 20 MG tablet Commonly known as: LIPITOR Take 10 mg by mouth daily.   bimatoprost 0.01 % Soln Commonly known as: LUMIGAN Place 1 drop into both eyes at bedtime.   CALCIUM 600 + D PO Take 1 tablet by mouth in the morning and at bedtime.   CENTRUM SILVER PO Take 1 tablet by mouth daily. 449mcg-300mcg.   donepezil 5 MG tablet Commonly known as: ARICEPT Take 5 mg by mouth at bedtime.   Eliquis 5 MG Tabs tablet Generic drug: apixaban Take 5 mg by mouth 2 (two) times daily.   furosemide 20 MG tablet Commonly known as: LASIX Take 20 mg by mouth daily.   Metoprolol Succinate 50 MG Cs24 Take 50 mg by mouth daily. Hold if Systolic is < 95 or if pulse is less than 60   triamcinolone cream 0.1 % Commonly known as: KENALOG Apply 1 application topically 2 (two) times daily as needed.   zinc oxide 20 % ointment Apply 1 application topically as needed for irritation.       Review of Systems  Constitutional: Negative for fatigue, fever and unexpected weight change.  HENT: Positive for hearing loss. Negative for congestion and trouble swallowing.  Respiratory: Negative for shortness of breath.   Cardiovascular: Positive for leg swelling.  Gastrointestinal: Negative for abdominal pain and constipation.  Genitourinary: Negative for dysuria.  Musculoskeletal: Positive for gait problem.  Skin: Negative for color change.  Neurological: Positive for speech difficulty and weakness. Negative for dizziness and headaches.       Dementia  Psychiatric/Behavioral: Negative for behavioral problems and sleep disturbance. The patient is not nervous/anxious.     Immunization History  Administered Date(s) Administered  . Influenza-Unspecified 04/02/2014, 04/01/2016, 04/14/2017, 03/17/2020  . Moderna SARS-COVID-2 Vaccination  06/17/2019, 07/15/2019  . Pneumococcal Conjugate-13 03/24/2014  . Pneumococcal Polysaccharide-23 02/26/2009  . Tdap 03/08/2012, 12/23/2018  . Zoster 09/03/2007   Pertinent  Health Maintenance Due  Topic Date Due  . URINE MICROALBUMIN  Never done  . DEXA SCAN  Never done  . INFLUENZA VACCINE  Completed  . PNA vac Low Risk Adult  Completed   No flowsheet data found. Functional Status Survey:    Vitals:   04/17/20 1555  BP: 134/60  Pulse: 82  Resp: 18  Temp: 97.9 F (36.6 C)  TempSrc: Oral  SpO2: 98%  Weight: 182 lb (82.6 kg)  Height: 5\' 1"  (1.549 m)   Body mass index is 34.39 kg/m. Physical Exam Vitals and nursing note reviewed.  Constitutional:      Appearance: Normal appearance.  HENT:     Head: Normocephalic and atraumatic.  Eyes:     Extraocular Movements: Extraocular movements intact.     Conjunctiva/sclera: Conjunctivae normal.     Pupils: Pupils are equal, round, and reactive to light.  Cardiovascular:     Rate and Rhythm: Normal rate and regular rhythm.     Heart sounds: No murmur heard.   Pulmonary:     Effort: Pulmonary effort is normal.     Breath sounds: No rhonchi or rales.  Abdominal:     General: Bowel sounds are normal.     Palpations: Abdomen is soft.     Tenderness: There is no abdominal tenderness.  Musculoskeletal:     Cervical back: Normal range of motion and neck supple.     Right lower leg: Edema present.     Left lower leg: Edema present.     Comments: Trace to 1+ edema BLE  Skin:    General: Skin is warm and dry.  Neurological:     Mental Status: She is alert. Mental status is at baseline.     Motor: Weakness present.     Gait: Gait abnormal.     Comments: Oriented to person. Left sided weakness.   Psychiatric:        Mood and Affect: Mood normal.        Behavior: Behavior normal.     Labs reviewed: Recent Labs    11/12/19 2259 11/12/19 2259 11/14/19 0350 11/25/19 0000 02/27/20 0000  NA 140   < > 136 138 140  K 4.4    < > 4.3 4.0 4.1  CL 101   < > 102 101 103  CO2 27   < > 27 27* 29*  GLUCOSE 104*  --  132*  --   --   BUN 27*   < > 19 27* 23*  CREATININE 1.18*   < > 1.15* 1.2* 1.0  CALCIUM 9.3   < > 8.5* 8.8 8.6*   < > = values in this interval not displayed.   Recent Labs    05/16/19 0000 11/25/19 0000 02/27/20 0000  AST 19 18 12*  ALT 15 16 9   ALKPHOS 86 95 81  ALBUMIN 3.8  --  3.2*   Recent Labs    11/12/19 2259 11/25/19 0000 02/27/20 0000  WBC 7.7 9.6 6.5  NEUTROABS  --   --  4,193  HGB 11.6* 12.6 10.1*  HCT 35.5* 37 31*  MCV 92.9  --   --   PLT 239 338 243   Lab Results  Component Value Date   TSH 1.75 11/25/2019   Lab Results  Component Value Date   HGBA1C 5.3 11/13/2019   Lab Results  Component Value Date   CHOL 89 02/27/2020   HDL 46 02/27/2020   LDLCALC 30 02/27/2020   TRIG 51 02/27/2020   CHOLHDL 2.2 11/13/2019    Significant Diagnostic Results in last 30 days:  No results found.  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   Alzheimer disease (HCC) No behavioral issues, continue supportive care in SNF [facility], continue Donepezil for memory.   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   Atrial fibrillation (HCC) Afib, heart rate is in control, takes Metoprolol, Eliquis.      Family/ staff Communication: plan of care reviewed with the patient and charge nurse.   Labs/tests ordered: none  Time spend 35 minutes.

## 2020-04-20 ENCOUNTER — Encounter: Payer: Self-pay | Admitting: Nurse Practitioner

## 2020-05-04 DIAGNOSIS — F0280 Dementia in other diseases classified elsewhere without behavioral disturbance: Secondary | ICD-10-CM | POA: Diagnosis not present

## 2020-05-04 DIAGNOSIS — G309 Alzheimer's disease, unspecified: Secondary | ICD-10-CM | POA: Diagnosis not present

## 2020-05-04 DIAGNOSIS — M6281 Muscle weakness (generalized): Secondary | ICD-10-CM | POA: Diagnosis not present

## 2020-05-04 DIAGNOSIS — N289 Disorder of kidney and ureter, unspecified: Secondary | ICD-10-CM | POA: Diagnosis not present

## 2020-05-04 DIAGNOSIS — R2681 Unsteadiness on feet: Secondary | ICD-10-CM | POA: Diagnosis not present

## 2020-05-04 DIAGNOSIS — I480 Paroxysmal atrial fibrillation: Secondary | ICD-10-CM | POA: Diagnosis not present

## 2020-05-04 DIAGNOSIS — I10 Essential (primary) hypertension: Secondary | ICD-10-CM | POA: Diagnosis not present

## 2020-05-04 DIAGNOSIS — Z9181 History of falling: Secondary | ICD-10-CM | POA: Diagnosis not present

## 2020-05-05 DIAGNOSIS — I10 Essential (primary) hypertension: Secondary | ICD-10-CM | POA: Diagnosis not present

## 2020-05-05 DIAGNOSIS — N289 Disorder of kidney and ureter, unspecified: Secondary | ICD-10-CM | POA: Diagnosis not present

## 2020-05-05 DIAGNOSIS — Z9181 History of falling: Secondary | ICD-10-CM | POA: Diagnosis not present

## 2020-05-05 DIAGNOSIS — M6281 Muscle weakness (generalized): Secondary | ICD-10-CM | POA: Diagnosis not present

## 2020-05-05 DIAGNOSIS — G309 Alzheimer's disease, unspecified: Secondary | ICD-10-CM | POA: Diagnosis not present

## 2020-05-05 DIAGNOSIS — R2681 Unsteadiness on feet: Secondary | ICD-10-CM | POA: Diagnosis not present

## 2020-05-06 ENCOUNTER — Encounter: Payer: Self-pay | Admitting: Adult Health

## 2020-05-06 ENCOUNTER — Other Ambulatory Visit: Payer: Self-pay

## 2020-05-06 ENCOUNTER — Ambulatory Visit (INDEPENDENT_AMBULATORY_CARE_PROVIDER_SITE_OTHER): Payer: Medicare Other | Admitting: Adult Health

## 2020-05-06 VITALS — BP 139/78 | HR 71 | Wt 188.0 lb

## 2020-05-06 DIAGNOSIS — Z9181 History of falling: Secondary | ICD-10-CM | POA: Diagnosis not present

## 2020-05-06 DIAGNOSIS — E785 Hyperlipidemia, unspecified: Secondary | ICD-10-CM | POA: Diagnosis not present

## 2020-05-06 DIAGNOSIS — G309 Alzheimer's disease, unspecified: Secondary | ICD-10-CM | POA: Diagnosis not present

## 2020-05-06 DIAGNOSIS — F0280 Dementia in other diseases classified elsewhere without behavioral disturbance: Secondary | ICD-10-CM

## 2020-05-06 DIAGNOSIS — Z8673 Personal history of transient ischemic attack (TIA), and cerebral infarction without residual deficits: Secondary | ICD-10-CM

## 2020-05-06 DIAGNOSIS — I10 Essential (primary) hypertension: Secondary | ICD-10-CM

## 2020-05-06 DIAGNOSIS — I4811 Longstanding persistent atrial fibrillation: Secondary | ICD-10-CM

## 2020-05-06 DIAGNOSIS — G301 Alzheimer's disease with late onset: Secondary | ICD-10-CM

## 2020-05-06 DIAGNOSIS — M6281 Muscle weakness (generalized): Secondary | ICD-10-CM | POA: Diagnosis not present

## 2020-05-06 DIAGNOSIS — N289 Disorder of kidney and ureter, unspecified: Secondary | ICD-10-CM | POA: Diagnosis not present

## 2020-05-06 DIAGNOSIS — R2681 Unsteadiness on feet: Secondary | ICD-10-CM | POA: Diagnosis not present

## 2020-05-06 NOTE — Progress Notes (Signed)
[facility] [address] [place]. [place] [address] [phone]       STROKE FOLLOW UP NOTE  Jasmine Woodward Date of Birth:  1927/12/07 Medical Record Number:  [id number]   Reason for Referral:  stroke follow up    SUBJECTIVE:   CHIEF COMPLAINT:  Chief Complaint  Patient presents with  . Follow-up    rm 9, with son, states pt is stable     HPI:   Today, 05/06/2020, Jasmine Woodward returns for 48-month stroke follow-up accompanied by her son.  She continues to reside at [facility].  Reports residual visual impairment, gait impairment and dysarthria which have been stable without worsening. Ambulating with RW at facility for short distance. Use of RW for long distance. Denies new or worsening stroke/TIA symptoms.  On Eliquis and atorvastatin for secondary stroke prevention without side effects.  Blood pressure today 139/78. Hx of Alzheimer's disease currently on Aricept which son reports has been slowly declining currently managed by PCP.  Denies behavioral concerns.  No concerns at this time.   History provided for reference purposes only Initial visit 12/23/2019 JM: Jasmine Woodward is being seen for hospital follow-up accompanied by her son in office and her daughter via telephone.  [facility] with residual left hemianopia, gait impairment and moderate dysarthria.  Recently completed PT and will be completing OT in the near future.  Currently ambulating with rolling walker without any falls.  Baseline dementia stable without worsening of stroke.  No behavioral concerns.  Long-term goal is for her to return to assisted living.  She has continued on Eliquis 5 mg twice daily without bleeding or bruising.  Continues on atorvastatin 20 mg daily on myalgias.  Blood pressure today 135/71.  No concerns at this time.  Stroke admission 11/12/2019 Ms. HYDIA Woodward is a 86 y.o. female with history of Alzheimer dementia, atrial fibrillation  (not on anticoagulation) and HTN presented on 11/12/2019 with slurred speech, L sided weakness, L facial droop and difficulty standing.  Stroke work-up revealed right PCA large infarct embolic secondary to AF not on AC.  On Pradaxa in the past but discontinued due to fall risk.  Recommended initiating Eliquis 5 mg twice daily for secondary stroke prevention.  History of HTN stable.  Other stroke risk factors include advanced age but no prior stroke history.  Baseline Alzheimer's dementia on Aricept with depression.  Residual deficits of moderate dysarthria, generalized weakness, left-sided neglect with right gaze preference and discharged to SNF for therapy needs.  Stroke:   R PCA large infarct embolic secondary to AF not on [facility]  MRI  R PCA infarct w/ petechial hemorrhage. Advanced atrophy.   CTA head & neck negative for LVO, 50% stenosis of proximal R ICA, atheromatous plaque of carotid siphons with mild-mod narrowing however these are anatomically not likely of being the etiology of her R PCA infarct.  2D Echo 10/2019 EF 60-65%. No source of embolus. LA moderate dilated  LDL 68    HgbA1c 5.3  SCDs for VTE prophylaxis  aspirin 325 mg daily prior to admission, now on aspirin 325 mg daily. Pt was on pradaxa in the past, but not on PTA due to fall risk. Discussed with son, he is OK with restarting AC. Will recommend eliquis 5mg  bid in one week (11/21/19), continue ASA 325 at meantime.   Therapy recommendations:  SNF   Disposition:   SNF   ROS:   Unable  to obtain due to cognitive impairment  PMH:  Past Medical History:  Diagnosis Date  . Alzheimer disease (HCC)   . Atrial fibrillation (HCC)   . Hypertension     PSH: No past surgical history on file.  Social History:  Social History   Socioeconomic History  . Marital status: Single    Spouse name: Not on file  . Number of children: Not on file  . Years of education: Not on file  . Highest education level: Not on file  Occupational  History  . Not on file  Tobacco Use  . Smoking status: Never Smoker  . Smokeless tobacco: Never Used  Vaping Use  . Vaping Use: Unknown  Substance and Sexual Activity  . Alcohol use: Not Currently  . Drug use: Never  . Sexual activity: Not Currently  Other Topics Concern  . Not on file  Social History Narrative   ** Merged History Encounter **       Social Determinants of Health   Financial Resource Strain:   . Difficulty of Paying Living Expenses: Not on file  Food Insecurity:   . Worried About Programme researcher, broadcasting/film/video in the Last Year: Not on file  . Ran Out of Food in the Last Year: Not on file  Transportation Needs:   . Lack of Transportation (Medical): Not on file  . Lack of Transportation (Non-Medical): Not on file  Physical Activity:   . Days of Exercise per Week: Not on file  . Minutes of Exercise per Session: Not on file  Stress:   . Feeling of Stress : Not on file  Social Connections:   . Frequency of Communication with Friends and Family: Not on file  . Frequency of Social Gatherings with Friends and Family: Not on file  . Attends Religious Services: Not on file  . Active Member of Clubs or Organizations: Not on file  . Attends Banker Meetings: Not on file  . Marital Status: Not on file  Intimate Partner Violence:   . Fear of Current or Ex-Partner: Not on file  . Emotionally Abused: Not on file  . Physically Abused: Not on file  . Sexually Abused: Not on file    Family History: No family history on file.  Medications:   Current Outpatient Medications on File Prior to Visit  Medication Sig Dispense Refill  . apixaban (ELIQUIS) 5 MG TABS tablet Take 5 mg by mouth 2 (two) times daily.    Marland Kitchen atorvastatin (LIPITOR) 20 MG tablet Take 10 mg by mouth daily.     . bimatoprost (LUMIGAN) 0.01 % SOLN Place 1 drop into both eyes at bedtime.     . Calcium Carb-Cholecalciferol (CALCIUM 600 + D PO) Take 1 tablet by mouth in the morning and at bedtime.    .  donepezil (ARICEPT) 5 MG tablet Take 5 mg by mouth at bedtime.    . furosemide (LASIX) 20 MG tablet Take 20 mg by mouth daily.    . Metoprolol Succinate 50 MG CS24 Take 50 mg by mouth daily. Hold if Systolic is < 95 or if pulse is less than 60    . Multiple Vitamins-Minerals (CENTRUM SILVER PO) Take 1 tablet by mouth daily. 480mcg-300mcg.     . triamcinolone cream (KENALOG) 0.1 % Apply 1 application topically 2 (two) times daily as needed.    . zinc oxide 20 % ointment Apply 1 application topically as needed for irritation.     No current facility-administered medications  on file prior to visit.    Allergies:   Allergies  Allergen Reactions  . Bee Venom     Bee Stings.      OBJECTIVE:  Physical Exam  Vitals:   05/06/20 1438  BP: 139/78  Pulse: 71  Weight: 188 lb (85.3 kg)   Body mass index is 35.52 kg/m. No exam data present  General: well developed, well nourished,  pleasant elderly Caucasian female, seated, in no evident distress Head: head normocephalic and atraumatic.   Neck: supple with no carotid or supraclavicular bruits Cardiovascular: irregular rate and rhythm, no murmurs Musculoskeletal: no deformity Skin:  no rash/petichiae Vascular:  Normal pulses all extremities   Neurologic Exam Mental Status: Awake and fully alert.  Moderate dysarthria.   Disoriented to place and time. Recent and remote memory diminished. Attention span, concentration and fund of knowledge impaired. Mood and affect appropriate and cooperative with exam.  Follows simple step commands without difficulty.  MMSE 12/30 Cranial Nerves: Pupils equal, briskly reactive to light. Extraocular movements full without nystagmus.  Does not blink to threat on left side but blinks to threat on right side.  Hearing intact. Facial sensation intact. Face, tongue, palate moves normally and symmetrically.  Motor: Normal bulk and tone. Normal strength in all tested extremity muscles. Sensory.: intact to touch ,  pinprick , position and vibratory sensation.  Coordination: Rapid alternating movements normal in all extremities. Finger-to-nose and heel-to-shin difficulty assessing due to cognition Gait and Station: Deferred as rolling walker not present during visit Reflexes: 1+ and symmetric. Toes downgoing.       ASSESSMENT: Jasmine Woodward is a 86 y.o. year old female presented with slurred speech, left-sided weakness, left facial droop and gait impairment on 11/12/2019 with stroke work-up revealing right PCA large infarct embolic secondary to known A. fib not on AC.  On Pradaxa in the past but discontinued due to fall risk with small SDH post fall 12/2018.  Eliquis initiated.  Vascular risk factors include HTN, A. fib, and right ICA stenosis.      PLAN:  1. Right PCA stroke:  a. Residual deficits: Gait impairment, moderate dysarthria and visual impairment stable without worsening.  [facility]. b. Continue Eliquis (apixaban) daily  and atorvastatin 20 mg daily for secondary stroke prevention. c. Discussed secondary stroke prevention measures and importance of close PCP/facility follow-up for aggressive stroke risk factor management 2. HTN: BP goal<130/90.  Stable on metoprolol, and furosemide per PCP/facility 3. HLD: LDL goal<70. On atorvastatin 20 mg daily per PCP/facility 4. Atrial fibrillation: CHA2DS2-VASc score 6.  On Eliquis per cardiology/facility 5. Dementia, Alzheimer's type: On Aricept currently managed by PCP/facility    Overall stable from stroke standpoint and recommend follow-up on an as-needed basis  CC:  [facility] provider: Dr. Rushie Nyhan, Freddie Breech, MD    I spent 30 minutes of face-to-face and non-face-to-face time with patient and son.  This included previsit chart review, lab review, study review, order entry, electronic health record documentation, patient education regarding prior stroke, residual deficits, importance of managing stroke risk factors  and answered all questions to patient and sons satisfaction   Ihor Austin, AGNP-BC  [facility] [address] [place], [place] [address]  Phone [phone] Fax [phone] Note: This document was prepared with digital dictation and possible smart phrase technology. Any transcriptional errors that result from this process are unintentional.

## 2020-05-06 NOTE — Patient Instructions (Signed)
Continue Eliquis (apixaban) daily  and atorvastatin for secondary stroke prevention  Continue to follow with PCP for Alzheimer's disease monitoring and management  Continue to follow up with PCP regarding cholesterol and blood pressure management  Maintain strict control of hypertension with blood pressure goal below 130/90 and cholesterol with LDL cholesterol (bad cholesterol) goal below 70 mg/dL.    Overall stable from stroke standpoint and recommend follow up on an as needed basis     Thank you for coming to see [place] at [facility]. I hope we have been able to provide you high quality care today.  You may receive a patient satisfaction survey over the next few weeks. We would appreciate your feedback and comments so that we may continue to improve ourselves and the health of our patients.

## 2020-05-07 NOTE — Progress Notes (Signed)
I agree with the above plan 

## 2020-05-08 DIAGNOSIS — R2681 Unsteadiness on feet: Secondary | ICD-10-CM | POA: Diagnosis not present

## 2020-05-08 DIAGNOSIS — G309 Alzheimer's disease, unspecified: Secondary | ICD-10-CM | POA: Diagnosis not present

## 2020-05-08 DIAGNOSIS — M6281 Muscle weakness (generalized): Secondary | ICD-10-CM | POA: Diagnosis not present

## 2020-05-08 DIAGNOSIS — Z9181 History of falling: Secondary | ICD-10-CM | POA: Diagnosis not present

## 2020-05-08 DIAGNOSIS — I10 Essential (primary) hypertension: Secondary | ICD-10-CM | POA: Diagnosis not present

## 2020-05-08 DIAGNOSIS — N289 Disorder of kidney and ureter, unspecified: Secondary | ICD-10-CM | POA: Diagnosis not present

## 2020-05-11 DIAGNOSIS — N289 Disorder of kidney and ureter, unspecified: Secondary | ICD-10-CM | POA: Diagnosis not present

## 2020-05-11 DIAGNOSIS — Z9181 History of falling: Secondary | ICD-10-CM | POA: Diagnosis not present

## 2020-05-11 DIAGNOSIS — R2681 Unsteadiness on feet: Secondary | ICD-10-CM | POA: Diagnosis not present

## 2020-05-11 DIAGNOSIS — M6281 Muscle weakness (generalized): Secondary | ICD-10-CM | POA: Diagnosis not present

## 2020-05-11 DIAGNOSIS — I10 Essential (primary) hypertension: Secondary | ICD-10-CM | POA: Diagnosis not present

## 2020-05-11 DIAGNOSIS — G309 Alzheimer's disease, unspecified: Secondary | ICD-10-CM | POA: Diagnosis not present

## 2020-05-12 DIAGNOSIS — I10 Essential (primary) hypertension: Secondary | ICD-10-CM | POA: Diagnosis not present

## 2020-05-12 DIAGNOSIS — G309 Alzheimer's disease, unspecified: Secondary | ICD-10-CM | POA: Diagnosis not present

## 2020-05-12 DIAGNOSIS — Z9181 History of falling: Secondary | ICD-10-CM | POA: Diagnosis not present

## 2020-05-12 DIAGNOSIS — R2681 Unsteadiness on feet: Secondary | ICD-10-CM | POA: Diagnosis not present

## 2020-05-12 DIAGNOSIS — M6281 Muscle weakness (generalized): Secondary | ICD-10-CM | POA: Diagnosis not present

## 2020-05-12 DIAGNOSIS — N289 Disorder of kidney and ureter, unspecified: Secondary | ICD-10-CM | POA: Diagnosis not present

## 2020-05-14 DIAGNOSIS — I480 Paroxysmal atrial fibrillation: Secondary | ICD-10-CM | POA: Diagnosis not present

## 2020-05-14 DIAGNOSIS — G309 Alzheimer's disease, unspecified: Secondary | ICD-10-CM | POA: Diagnosis not present

## 2020-05-14 DIAGNOSIS — M6281 Muscle weakness (generalized): Secondary | ICD-10-CM | POA: Diagnosis not present

## 2020-05-14 DIAGNOSIS — F0280 Dementia in other diseases classified elsewhere without behavioral disturbance: Secondary | ICD-10-CM | POA: Diagnosis not present

## 2020-05-14 DIAGNOSIS — N289 Disorder of kidney and ureter, unspecified: Secondary | ICD-10-CM | POA: Diagnosis not present

## 2020-05-14 DIAGNOSIS — R2681 Unsteadiness on feet: Secondary | ICD-10-CM | POA: Diagnosis not present

## 2020-05-14 DIAGNOSIS — Z9181 History of falling: Secondary | ICD-10-CM | POA: Diagnosis not present

## 2020-05-14 DIAGNOSIS — I10 Essential (primary) hypertension: Secondary | ICD-10-CM | POA: Diagnosis not present

## 2020-05-15 DIAGNOSIS — G309 Alzheimer's disease, unspecified: Secondary | ICD-10-CM | POA: Diagnosis not present

## 2020-05-15 DIAGNOSIS — M6281 Muscle weakness (generalized): Secondary | ICD-10-CM | POA: Diagnosis not present

## 2020-05-15 DIAGNOSIS — Z9181 History of falling: Secondary | ICD-10-CM | POA: Diagnosis not present

## 2020-05-15 DIAGNOSIS — N289 Disorder of kidney and ureter, unspecified: Secondary | ICD-10-CM | POA: Diagnosis not present

## 2020-05-15 DIAGNOSIS — R2681 Unsteadiness on feet: Secondary | ICD-10-CM | POA: Diagnosis not present

## 2020-05-15 DIAGNOSIS — I10 Essential (primary) hypertension: Secondary | ICD-10-CM | POA: Diagnosis not present

## 2020-05-18 DIAGNOSIS — G309 Alzheimer's disease, unspecified: Secondary | ICD-10-CM | POA: Diagnosis not present

## 2020-05-18 DIAGNOSIS — R2681 Unsteadiness on feet: Secondary | ICD-10-CM | POA: Diagnosis not present

## 2020-05-18 DIAGNOSIS — Z9181 History of falling: Secondary | ICD-10-CM | POA: Diagnosis not present

## 2020-05-18 DIAGNOSIS — M6281 Muscle weakness (generalized): Secondary | ICD-10-CM | POA: Diagnosis not present

## 2020-05-18 DIAGNOSIS — N289 Disorder of kidney and ureter, unspecified: Secondary | ICD-10-CM | POA: Diagnosis not present

## 2020-05-18 DIAGNOSIS — I10 Essential (primary) hypertension: Secondary | ICD-10-CM | POA: Diagnosis not present

## 2020-05-20 DIAGNOSIS — G309 Alzheimer's disease, unspecified: Secondary | ICD-10-CM | POA: Diagnosis not present

## 2020-05-20 DIAGNOSIS — M6281 Muscle weakness (generalized): Secondary | ICD-10-CM | POA: Diagnosis not present

## 2020-05-20 DIAGNOSIS — N289 Disorder of kidney and ureter, unspecified: Secondary | ICD-10-CM | POA: Diagnosis not present

## 2020-05-20 DIAGNOSIS — R2681 Unsteadiness on feet: Secondary | ICD-10-CM | POA: Diagnosis not present

## 2020-05-20 DIAGNOSIS — Z9181 History of falling: Secondary | ICD-10-CM | POA: Diagnosis not present

## 2020-05-20 DIAGNOSIS — I10 Essential (primary) hypertension: Secondary | ICD-10-CM | POA: Diagnosis not present

## 2020-05-21 ENCOUNTER — Non-Acute Institutional Stay (SKILLED_NURSING_FACILITY): Payer: Medicare Other | Admitting: Internal Medicine

## 2020-05-21 ENCOUNTER — Encounter: Payer: Self-pay | Admitting: Internal Medicine

## 2020-05-21 DIAGNOSIS — I63431 Cerebral infarction due to embolism of right posterior cerebral artery: Secondary | ICD-10-CM | POA: Diagnosis not present

## 2020-05-21 DIAGNOSIS — N1832 Chronic kidney disease, stage 3b: Secondary | ICD-10-CM

## 2020-05-21 DIAGNOSIS — D649 Anemia, unspecified: Secondary | ICD-10-CM

## 2020-05-21 DIAGNOSIS — F0280 Dementia in other diseases classified elsewhere without behavioral disturbance: Secondary | ICD-10-CM | POA: Diagnosis not present

## 2020-05-21 DIAGNOSIS — G309 Alzheimer's disease, unspecified: Secondary | ICD-10-CM | POA: Diagnosis not present

## 2020-05-21 DIAGNOSIS — I480 Paroxysmal atrial fibrillation: Secondary | ICD-10-CM | POA: Diagnosis not present

## 2020-05-21 DIAGNOSIS — I10 Essential (primary) hypertension: Secondary | ICD-10-CM | POA: Diagnosis not present

## 2020-05-21 DIAGNOSIS — R600 Localized edema: Secondary | ICD-10-CM | POA: Diagnosis not present

## 2020-05-21 DIAGNOSIS — R21 Rash and other nonspecific skin eruption: Secondary | ICD-10-CM

## 2020-05-21 NOTE — Progress Notes (Signed)
Location:    [facility] Room Number: 7 Place of Service:  SNF [phone]) Provider:  Einar Crow MD  Mahlon Gammon, MD  Patient Care Team: Mahlon Gammon, MD as PCP - General (Internal Medicine)  Extended Emergency Contact Information Primary Emergency Contact: Marval Regal Address: [address]          [place], [place] [address] [place] Home Phone: [phone] Relation: Son  Code Status:  DNR Goals of care: Advanced Directive information Advanced Directives 04/17/2020  Does Patient Have a Medical Advance Directive? Yes  Type of Advance Directive Out of facility DNR (pink MOST or yellow form)  Does patient want to make changes to medical advance directive? No - Patient declined  Copy of Healthcare Power of Attorney in Chart? -  Would patient like information on creating a medical advance directive? -  Pre-existing out of facility DNR order (yellow form or pink MOST form) Yellow form placed in chart (order not valid for inpatient use);Pink MOST form placed in chart (order not valid for inpatient use)     Chief Complaint  Patient presents with  . Medical Management of Chronic Issues  . Health Maintenance    Urine microalbumin and Dexa scan    HPI:  Pt is a 86 y.o. female seen today for medical management of chronic diseases.    Is Long term resident  She was admitted after she had a acute right PCA infarct.  Has been on Eliquis since then.  Also has a history of Alzheimer's dementia, hypertension, A. fib.  Her echo had a EF of 60 to 65% with LA dilated  Stable in SNF No falls. Has Gained weight. Usually stays in her room. Wheelchair dependent Only Nursing issue of Papular rash in her Arms and Back C/o Itching No Other issues Past Medical History:  Diagnosis Date  . Alzheimer disease (HCC)   . Atrial fibrillation (HCC)   . Hypertension    History reviewed. No pertinent surgical history.  Allergies  Allergen Reactions  .  Bee Venom     Bee Stings.    Allergies as of 05/21/2020      Reactions   Bee Venom    Bee Stings.      Medication List       Accurate as of May 21, 2020 10:12 AM. If you have any questions, ask your nurse or doctor.        apixaban 5 MG Tabs tablet Commonly known as: ELIQUIS Take 5 mg by mouth 2 (two) times daily.   atorvastatin 20 MG tablet Commonly known as: LIPITOR Take 10 mg by mouth daily.   bimatoprost 0.01 % Soln Commonly known as: LUMIGAN Place 1 drop into both eyes at bedtime.   CALCIUM 600 + D PO Take 1 tablet by mouth in the morning and at bedtime.   CENTRUM SILVER PO Take 1 tablet by mouth daily. 452mcg-300mcg.   donepezil 5 MG tablet Commonly known as: ARICEPT Take 5 mg by mouth at bedtime.   furosemide 20 MG tablet Commonly known as: LASIX Take 20 mg by mouth daily.   Metoprolol Succinate 50 MG Cs24 Take 50 mg by mouth daily. Hold if Systolic is < 95 or if pulse is less than 60   triamcinolone 0.1 % Commonly known as: KENALOG Apply 1 application topically 2 (two) times daily as needed.   zinc oxide 20 % ointment Apply 1 application topically as needed for irritation.  Review of Systems  Unable to perform ROS: Dementia  Constitutional: Positive for unexpected weight change.  HENT: Negative.   Respiratory: Negative.   Cardiovascular: Positive for leg swelling.  Gastrointestinal: Negative.   Genitourinary: Negative.   Musculoskeletal: Positive for gait problem.  Skin: Positive for rash.  Neurological: Positive for weakness.  Psychiatric/Behavioral: Positive for confusion.    Immunization History  Administered Date(s) Administered  . Influenza-Unspecified 04/02/2014, 04/01/2016, 04/14/2017, 03/17/2020  . Moderna SARS-COVID-2 Vaccination 06/17/2019, 07/15/2019, 04/27/2020  . Pneumococcal Conjugate-13 03/24/2014  . Pneumococcal Polysaccharide-23 02/26/2009  . Tdap 03/08/2012, 12/23/2018  . Zoster 09/03/2007   Pertinent   Health Maintenance Due  Topic Date Due  . URINE MICROALBUMIN  Never done  . DEXA SCAN  Never done  . INFLUENZA VACCINE  Completed  . PNA vac Low Risk Adult  Completed   No flowsheet data found. Functional Status Survey:    Vitals:   05/21/20 1008  BP: 128/80  Pulse: 68  Resp: 20  Temp: (!) 97.2 F (36.2 C)  SpO2: 98%  Weight: 184 lb (83.5 kg)  Height: 5\' 1"  (1.549 m)   Body mass index is 34.77 kg/m. Physical Exam Vitals reviewed.  Constitutional:      Appearance: Normal appearance. She is obese.  HENT:     Head: Normocephalic.     Nose: Nose normal.     Mouth/Throat:     Mouth: Mucous membranes are moist.     Pharynx: Oropharynx is clear.  Eyes:     Pupils: Pupils are equal, round, and reactive to light.  Cardiovascular:     Rate and Rhythm: Normal rate and regular rhythm.     Pulses: Normal pulses.     Heart sounds: Normal heart sounds.  Pulmonary:     Effort: Pulmonary effort is normal.     Breath sounds: Normal breath sounds.  Abdominal:     General: Abdomen is flat. Bowel sounds are normal.     Palpations: Abdomen is soft.  Musculoskeletal:        General: Swelling present.     Cervical back: Neck supple.  Skin:    General: Skin is warm.  Neurological:     General: No focal deficit present.     Mental Status: She is alert.  Psychiatric:        Mood and Affect: Mood normal.        Thought Content: Thought content normal.     Labs reviewed: Recent Labs    11/12/19 2259 11/14/19 0350 11/25/19 0000 02/27/20 0000  NA 140 136 138 140  K 4.4 4.3 4.0 4.1  CL 101 102 101 103  CO2 27 27 27* 29*  GLUCOSE 104* 132*  --   --   BUN 27* 19 27* 23*  CREATININE 1.18* 1.15* 1.2* 1.0  CALCIUM 9.3 8.5* 8.8 8.6*   Recent Labs    11/25/19 0000 02/27/20 0000  AST 18 12*  ALT 16 9  ALKPHOS 95 81  ALBUMIN  --  3.2*   Recent Labs    11/12/19 2259 11/25/19 0000 02/27/20 0000  WBC 7.7 9.6 6.5  NEUTROABS  --   --  4,193  HGB 11.6* 12.6 10.1*  HCT  35.5* 37 31*  MCV 92.9  --   --   PLT 239 338 243   Lab Results  Component Value Date   TSH 1.75 11/25/2019   Lab Results  Component Value Date   HGBA1C 5.3 11/13/2019   Lab Results  Component Value  Date   CHOL 89 02/27/2020   HDL 46 02/27/2020   LDLCALC 30 02/27/2020   TRIG 51 02/27/2020   CHOLHDL 2.2 11/13/2019    Significant Diagnostic Results in last 30 days:  No results found.  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) Continues to have Some residual Deficits Stable on Eliquis Also on statin BP controlled Anemia, unspecified type Will Repeat CBC and Iron studoes with B12  Alzheimer disease (HCC) On Low dose of Aricept and supportive care MMSE 12/30  Paroxysmal atrial fibrillation (HCC) On Eliquis Rate control on Metoprolol  Stage 3b chronic kidney disease (HCC) Repeat BMP  Bilateral leg edema Doing well with Lasix and ted hoses  Essential hypertension Controlled  Papular rash Triamcinolone cream  Hyperlipidemia LDL 30 in 9/21 On Lipitor  Family/ staff Communication:   Labs/tests ordered:  CBC,BMP, Iron studies and B12   Total time spent in this patient care encounter was  45_  minutes; greater than 50% of the visit spent counseling patient and staff, reviewing records , Labs and coordinating care for problems addressed at this encounter.

## 2020-05-22 DIAGNOSIS — G309 Alzheimer's disease, unspecified: Secondary | ICD-10-CM | POA: Diagnosis not present

## 2020-05-22 DIAGNOSIS — M6281 Muscle weakness (generalized): Secondary | ICD-10-CM | POA: Diagnosis not present

## 2020-05-22 DIAGNOSIS — I10 Essential (primary) hypertension: Secondary | ICD-10-CM | POA: Diagnosis not present

## 2020-05-22 DIAGNOSIS — R2681 Unsteadiness on feet: Secondary | ICD-10-CM | POA: Diagnosis not present

## 2020-05-22 DIAGNOSIS — N289 Disorder of kidney and ureter, unspecified: Secondary | ICD-10-CM | POA: Diagnosis not present

## 2020-05-22 DIAGNOSIS — Z9181 History of falling: Secondary | ICD-10-CM | POA: Diagnosis not present

## 2020-05-25 DIAGNOSIS — I10 Essential (primary) hypertension: Secondary | ICD-10-CM | POA: Diagnosis not present

## 2020-05-25 DIAGNOSIS — N289 Disorder of kidney and ureter, unspecified: Secondary | ICD-10-CM | POA: Diagnosis not present

## 2020-05-25 DIAGNOSIS — Z9181 History of falling: Secondary | ICD-10-CM | POA: Diagnosis not present

## 2020-05-25 DIAGNOSIS — M6281 Muscle weakness (generalized): Secondary | ICD-10-CM | POA: Diagnosis not present

## 2020-05-25 DIAGNOSIS — R2681 Unsteadiness on feet: Secondary | ICD-10-CM | POA: Diagnosis not present

## 2020-05-25 DIAGNOSIS — G309 Alzheimer's disease, unspecified: Secondary | ICD-10-CM | POA: Diagnosis not present

## 2020-05-26 DIAGNOSIS — I10 Essential (primary) hypertension: Secondary | ICD-10-CM | POA: Diagnosis not present

## 2020-05-26 DIAGNOSIS — R2681 Unsteadiness on feet: Secondary | ICD-10-CM | POA: Diagnosis not present

## 2020-05-26 DIAGNOSIS — M6281 Muscle weakness (generalized): Secondary | ICD-10-CM | POA: Diagnosis not present

## 2020-05-26 DIAGNOSIS — Z9181 History of falling: Secondary | ICD-10-CM | POA: Diagnosis not present

## 2020-05-26 DIAGNOSIS — G309 Alzheimer's disease, unspecified: Secondary | ICD-10-CM | POA: Diagnosis not present

## 2020-05-26 DIAGNOSIS — N289 Disorder of kidney and ureter, unspecified: Secondary | ICD-10-CM | POA: Diagnosis not present

## 2020-05-28 DIAGNOSIS — D649 Anemia, unspecified: Secondary | ICD-10-CM | POA: Diagnosis not present

## 2020-05-28 DIAGNOSIS — D509 Iron deficiency anemia, unspecified: Secondary | ICD-10-CM | POA: Diagnosis not present

## 2020-05-28 DIAGNOSIS — D519 Vitamin B12 deficiency anemia, unspecified: Secondary | ICD-10-CM | POA: Diagnosis not present

## 2020-05-28 DIAGNOSIS — E611 Iron deficiency: Secondary | ICD-10-CM | POA: Diagnosis not present

## 2020-05-28 DIAGNOSIS — Z13228 Encounter for screening for other metabolic disorders: Secondary | ICD-10-CM | POA: Diagnosis not present

## 2020-05-28 DIAGNOSIS — I10 Essential (primary) hypertension: Secondary | ICD-10-CM | POA: Diagnosis not present

## 2020-05-28 LAB — BASIC METABOLIC PANEL
BUN: 26 — AB (ref 4–21)
CO2: 28 — AB (ref 13–22)
Chloride: 103 (ref 99–108)
Creatinine: 1.2 — AB (ref 0.5–1.1)
Glucose: 95
Potassium: 4.3 (ref 3.4–5.3)
Sodium: 140 (ref 137–147)

## 2020-05-28 LAB — CBC AND DIFFERENTIAL
HCT: 29 — AB (ref 36–46)
Hemoglobin: 9.4 — AB (ref 12.0–16.0)
Platelets: 229 (ref 150–399)
WBC: 5.9

## 2020-05-28 LAB — VITAMIN B12: Vitamin B-12: 475

## 2020-05-28 LAB — CBC: RBC: 3.15 — AB (ref 3.87–5.11)

## 2020-05-28 LAB — IRON,TIBC AND FERRITIN PANEL: Ferritin: 49

## 2020-05-28 LAB — COMPREHENSIVE METABOLIC PANEL: Calcium: 9 (ref 8.7–10.7)

## 2020-05-29 DIAGNOSIS — N289 Disorder of kidney and ureter, unspecified: Secondary | ICD-10-CM | POA: Diagnosis not present

## 2020-05-29 DIAGNOSIS — M6281 Muscle weakness (generalized): Secondary | ICD-10-CM | POA: Diagnosis not present

## 2020-05-29 DIAGNOSIS — R2681 Unsteadiness on feet: Secondary | ICD-10-CM | POA: Diagnosis not present

## 2020-05-29 DIAGNOSIS — I10 Essential (primary) hypertension: Secondary | ICD-10-CM | POA: Diagnosis not present

## 2020-05-29 DIAGNOSIS — G309 Alzheimer's disease, unspecified: Secondary | ICD-10-CM | POA: Diagnosis not present

## 2020-05-29 DIAGNOSIS — Z9181 History of falling: Secondary | ICD-10-CM | POA: Diagnosis not present

## 2020-06-01 DIAGNOSIS — R2681 Unsteadiness on feet: Secondary | ICD-10-CM | POA: Diagnosis not present

## 2020-06-01 DIAGNOSIS — M6281 Muscle weakness (generalized): Secondary | ICD-10-CM | POA: Diagnosis not present

## 2020-06-01 DIAGNOSIS — N289 Disorder of kidney and ureter, unspecified: Secondary | ICD-10-CM | POA: Diagnosis not present

## 2020-06-01 DIAGNOSIS — G309 Alzheimer's disease, unspecified: Secondary | ICD-10-CM | POA: Diagnosis not present

## 2020-06-01 DIAGNOSIS — I10 Essential (primary) hypertension: Secondary | ICD-10-CM | POA: Diagnosis not present

## 2020-06-01 DIAGNOSIS — Z9181 History of falling: Secondary | ICD-10-CM | POA: Diagnosis not present

## 2020-06-03 DIAGNOSIS — M6281 Muscle weakness (generalized): Secondary | ICD-10-CM | POA: Diagnosis not present

## 2020-06-03 DIAGNOSIS — N289 Disorder of kidney and ureter, unspecified: Secondary | ICD-10-CM | POA: Diagnosis not present

## 2020-06-03 DIAGNOSIS — R2681 Unsteadiness on feet: Secondary | ICD-10-CM | POA: Diagnosis not present

## 2020-06-03 DIAGNOSIS — G309 Alzheimer's disease, unspecified: Secondary | ICD-10-CM | POA: Diagnosis not present

## 2020-06-03 DIAGNOSIS — I10 Essential (primary) hypertension: Secondary | ICD-10-CM | POA: Diagnosis not present

## 2020-06-03 DIAGNOSIS — Z9181 History of falling: Secondary | ICD-10-CM | POA: Diagnosis not present

## 2020-06-04 DIAGNOSIS — G309 Alzheimer's disease, unspecified: Secondary | ICD-10-CM | POA: Diagnosis not present

## 2020-06-04 DIAGNOSIS — M6281 Muscle weakness (generalized): Secondary | ICD-10-CM | POA: Diagnosis not present

## 2020-06-04 DIAGNOSIS — I10 Essential (primary) hypertension: Secondary | ICD-10-CM | POA: Diagnosis not present

## 2020-06-04 DIAGNOSIS — R2681 Unsteadiness on feet: Secondary | ICD-10-CM | POA: Diagnosis not present

## 2020-06-04 DIAGNOSIS — Z9181 History of falling: Secondary | ICD-10-CM | POA: Diagnosis not present

## 2020-06-04 DIAGNOSIS — N289 Disorder of kidney and ureter, unspecified: Secondary | ICD-10-CM | POA: Diagnosis not present

## 2020-06-08 DIAGNOSIS — R2681 Unsteadiness on feet: Secondary | ICD-10-CM | POA: Diagnosis not present

## 2020-06-08 DIAGNOSIS — G309 Alzheimer's disease, unspecified: Secondary | ICD-10-CM | POA: Diagnosis not present

## 2020-06-08 DIAGNOSIS — N289 Disorder of kidney and ureter, unspecified: Secondary | ICD-10-CM | POA: Diagnosis not present

## 2020-06-08 DIAGNOSIS — I10 Essential (primary) hypertension: Secondary | ICD-10-CM | POA: Diagnosis not present

## 2020-06-08 DIAGNOSIS — Z9181 History of falling: Secondary | ICD-10-CM | POA: Diagnosis not present

## 2020-06-08 DIAGNOSIS — M6281 Muscle weakness (generalized): Secondary | ICD-10-CM | POA: Diagnosis not present

## 2020-06-10 DIAGNOSIS — R2681 Unsteadiness on feet: Secondary | ICD-10-CM | POA: Diagnosis not present

## 2020-06-10 DIAGNOSIS — G309 Alzheimer's disease, unspecified: Secondary | ICD-10-CM | POA: Diagnosis not present

## 2020-06-10 DIAGNOSIS — Z9181 History of falling: Secondary | ICD-10-CM | POA: Diagnosis not present

## 2020-06-10 DIAGNOSIS — N289 Disorder of kidney and ureter, unspecified: Secondary | ICD-10-CM | POA: Diagnosis not present

## 2020-06-10 DIAGNOSIS — I10 Essential (primary) hypertension: Secondary | ICD-10-CM | POA: Diagnosis not present

## 2020-06-10 DIAGNOSIS — M6281 Muscle weakness (generalized): Secondary | ICD-10-CM | POA: Diagnosis not present

## 2020-06-12 DIAGNOSIS — N289 Disorder of kidney and ureter, unspecified: Secondary | ICD-10-CM | POA: Diagnosis not present

## 2020-06-12 DIAGNOSIS — R2681 Unsteadiness on feet: Secondary | ICD-10-CM | POA: Diagnosis not present

## 2020-06-12 DIAGNOSIS — Z9181 History of falling: Secondary | ICD-10-CM | POA: Diagnosis not present

## 2020-06-12 DIAGNOSIS — M6281 Muscle weakness (generalized): Secondary | ICD-10-CM | POA: Diagnosis not present

## 2020-06-12 DIAGNOSIS — G309 Alzheimer's disease, unspecified: Secondary | ICD-10-CM | POA: Diagnosis not present

## 2020-06-12 DIAGNOSIS — I10 Essential (primary) hypertension: Secondary | ICD-10-CM | POA: Diagnosis not present

## 2020-06-16 ENCOUNTER — Non-Acute Institutional Stay (SKILLED_NURSING_FACILITY): Payer: Medicare Other | Admitting: Nurse Practitioner

## 2020-06-16 ENCOUNTER — Encounter: Payer: Self-pay | Admitting: Nurse Practitioner

## 2020-06-16 DIAGNOSIS — N1832 Chronic kidney disease, stage 3b: Secondary | ICD-10-CM | POA: Diagnosis not present

## 2020-06-16 DIAGNOSIS — N189 Chronic kidney disease, unspecified: Secondary | ICD-10-CM | POA: Insufficient documentation

## 2020-06-16 DIAGNOSIS — R609 Edema, unspecified: Secondary | ICD-10-CM

## 2020-06-16 DIAGNOSIS — F0280 Dementia in other diseases classified elsewhere without behavioral disturbance: Secondary | ICD-10-CM

## 2020-06-16 DIAGNOSIS — D631 Anemia in chronic kidney disease: Secondary | ICD-10-CM | POA: Insufficient documentation

## 2020-06-16 DIAGNOSIS — I63431 Cerebral infarction due to embolism of right posterior cerebral artery: Secondary | ICD-10-CM

## 2020-06-16 DIAGNOSIS — G309 Alzheimer's disease, unspecified: Secondary | ICD-10-CM

## 2020-06-16 DIAGNOSIS — E785 Hyperlipidemia, unspecified: Secondary | ICD-10-CM | POA: Diagnosis not present

## 2020-06-16 NOTE — Progress Notes (Signed)
Location:   [facility] Room Number: 07 Place of Service:  SNF (31) Provider: [facility] Aicha Clingenpeel NP  Mahlon Gammon, MD  Patient Care Team: Mahlon Gammon, MD as PCP - General (Internal Medicine)  Extended Emergency Contact Information Primary Emergency Contact: Marval Regal Address: [address]          [place], [place] [address] [place] Home Phone: [phone] Relation: Son  Code Status:  DNR Goals of care: Advanced Directive information Advanced Directives 04/17/2020  Does Patient Have a Medical Advance Directive? Yes  Type of Advance Directive Out of facility DNR (pink MOST or yellow form)  Does patient want to make changes to medical advance directive? No - Patient declined  Copy of Healthcare Power of Attorney in Chart? -  Would patient like information on creating a medical advance directive? -  Pre-existing out of facility DNR order (yellow form or pink MOST form) Yellow form placed in chart (order not valid for inpatient use);Pink MOST form placed in chart (order not valid for inpatient use)     Chief Complaint  Patient presents with  . Medical Management of Chronic Issues    HPI:  Jasmine Woodward is a 87 y.o. female seen today for medical management of chronic diseases.     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  Hx of Alzheimer's dementia,on Donepezil5mg  qd. MMSE 12/30 05/06/20 CKD, Bun/creat 26/1.24 05/28/20             Afib, heart rate is in control, takes Metoprolol, Eliquis.   Hyperlipidemia, LDL 30 02/2020, takes Atorvastatin.   BLE edema, takes Furosemide.   Anemia, Hgb 9.4, Vit 12 475, Ferritin 49, Iron 51 05/28/20     Past Medical History:  Diagnosis Date  . Alzheimer disease (HCC)   . Atrial fibrillation (HCC)   . Hypertension    History reviewed. No pertinent surgical history.  Allergies  Allergen  Reactions  . Bee Venom     Bee Stings.    Allergies as of 06/16/2020      Reactions   Bee Venom    Bee Stings.      Medication List       Accurate as of June 16, 2020 11:59 PM. If you have any questions, ask your nurse or doctor.        apixaban 5 MG Tabs tablet Commonly known as: ELIQUIS Take 5 mg by mouth 2 (two) times daily.   atorvastatin 20 MG tablet Commonly known as: LIPITOR Take 10 mg by mouth daily.   bimatoprost 0.01 % Soln Commonly known as: LUMIGAN Place 1 drop into both eyes at bedtime.   CALCIUM 600 + D PO Take 1 tablet by mouth in the morning and at bedtime.   CENTRUM SILVER PO Take 1 tablet by mouth daily. 445mcg-300mcg.   donepezil 5 MG tablet Commonly known as: ARICEPT Take 5 mg by mouth at bedtime.   furosemide 20 MG tablet Commonly known as: LASIX Take 20 mg by mouth daily.   Metoprolol Succinate 50 MG Cs24 Take 50 mg by mouth daily. Hold if Systolic is < 95 or if pulse is less than 60   triamcinolone 0.1 % Commonly known as: KENALOG Apply 1 application topically 2 (two) times daily as needed.   zinc oxide 20 % ointment Apply 1 application topically as needed for irritation.       Review of Systems  Constitutional: Negative for fatigue, fever and unexpected weight change.  HENT: Positive for hearing loss. Negative for congestion and trouble swallowing.   Respiratory: Negative for shortness of breath.   Cardiovascular: Positive for leg swelling.  Gastrointestinal: Negative for abdominal pain and constipation.  Genitourinary: Negative for dysuria.  Musculoskeletal: Positive for gait problem.  Skin: Negative for color change.  Neurological: Positive for speech difficulty and weakness. Negative for dizziness and headaches.       Dementia. Residual left sided weakness.   Psychiatric/Behavioral: Negative for behavioral problems and sleep disturbance. The patient is not nervous/anxious.     Immunization History  Administered Date(s)  Administered  . Influenza-Unspecified 04/02/2014, 04/01/2016, 04/14/2017, 03/17/2020  . Moderna Sars-Covid-2 Vaccination 06/17/2019, 07/15/2019, 04/27/2020  . Pneumococcal Conjugate-13 03/24/2014  . Pneumococcal Polysaccharide-23 02/26/2009  . Tdap 03/08/2012, 12/23/2018  . Zoster 09/03/2007   Pertinent  Health Maintenance Due  Topic Date Due  . URINE MICROALBUMIN  Never done  . DEXA SCAN  Never done  . INFLUENZA VACCINE  Completed  . PNA vac Low Risk Adult  Completed   No flowsheet data found. Functional Status Survey:    Vitals:   06/16/20 1515  BP: 130/76  Pulse: 79  Resp: 20  Temp: (!) 96.9 F (36.1 C)  SpO2: 98%  Weight: 180 lb (81.6 kg)  Height: 5\' 1"  (1.941 m)   Body mass index is 34.01 kg/m. Physical Exam Vitals and nursing note reviewed.  Constitutional:      Appearance: Normal appearance.  HENT:     Head: Normocephalic and atraumatic.  Eyes:     Extraocular Movements: Extraocular movements intact.     Conjunctiva/sclera: Conjunctivae normal.     Pupils: Pupils are equal, round, and reactive to light.  Cardiovascular:     Rate and Rhythm: Normal rate. Rhythm irregular.     Heart sounds: No murmur heard.   Pulmonary:     Effort: Pulmonary effort is normal.     Breath sounds: No rhonchi or rales.  Abdominal:     General: Bowel sounds are normal.     Palpations: Abdomen is soft.     Tenderness: There is no abdominal tenderness.  Musculoskeletal:     Cervical back: Normal range of motion and neck supple.     Right lower leg: Edema present.     Left lower leg: Edema present.     Comments: Trace  edema BLE  Skin:    General: Skin is warm and dry.  Neurological:     Mental Status: She is alert. Mental status is at baseline.     Motor: Weakness present.     Gait: Gait abnormal.     Comments: Oriented to person and place.  Left sided weakness. Ambulates with walker.   Psychiatric:        Mood and Affect: Mood normal.        Behavior: Behavior  normal.     Labs reviewed: Recent Labs    11/12/19 2259 11/14/19 0350 11/25/19 0000 02/27/20 0000 05/28/20 0000  NA 140 136 138 140 140  K 4.4 4.3 4.0 4.1 4.3  CL 101 102 101 103 103  CO2 27 27 27* 29* 28*  GLUCOSE 104* 132*  --   --   --   BUN 27* 19 27* 23* 26*  CREATININE 1.18* 1.15* 1.2* 1.0 1.2*  CALCIUM 9.3 8.5* 8.8 8.6* 9.0   Recent Labs    11/25/19 0000 02/27/20 0000  AST 18 12*  ALT 16 9  ALKPHOS 95 81  ALBUMIN  --  3.2*   Recent Labs    11/12/19 2259 11/25/19 0000 02/27/20 0000 05/28/20 0000  WBC 7.7 9.6 6.5 5.9  NEUTROABS  --   --  4,193  --   HGB 11.6* 12.6 10.1* 9.4*  HCT 35.5* 37 31* 29*  MCV 92.9  --   --   --   PLT 239 338 243 229   Lab Results  Component Value Date   TSH 1.75 11/25/2019   Lab Results  Component Value Date   HGBA1C 5.3 11/13/2019   Lab Results  Component Value Date   CHOL 89 02/27/2020   HDL 46 02/27/2020   LDLCALC 30 02/27/2020   TRIG 51 02/27/2020   CHOLHDL 2.2 11/13/2019    Significant Diagnostic Results in last 30 days:  No results found.  Assessment/Plan CKD (chronic kidney disease) stage 3, GFR 30-59 ml/min (HCC) Bun/creat 26/1.24, GFR 38 06/07/20  Edema BLE edema, takes Furosemide.   Hyperlipidemia Hyperlipidemia, LDL 30 02/2020, takes Atorvastatin.    Atrial fibrillation (HCC) Afib, heart rate is in control, takes Metoprolol, Eliquis.    Anemia due to chronic kidney disease Anemia, Hgb 9.4, Vit 12 475, Ferritin 49, Iron 51 05/28/20   Alzheimer disease (HCC) Hx of Alzheimer's dementia,on Donepezil5mg  qd. MMSE 12/30 05/06/20  Stroke (HCC) Hx of acute CVA-embolism of right posterior cerebral artery,with left side weakness, on Eliquis, Atorvastatin, SNF [facility] for care needs.     Family/ staff Communication: plan of care reviewed with the patient and charge nurse.   Labs/tests ordered:  none  Time spend 35 minutes.

## 2020-06-16 NOTE — Assessment & Plan Note (Signed)
Hx of Alzheimer's dementia,on Donepezil5mg qd. MMSE 12/30 05/06/20 

## 2020-06-16 NOTE — Assessment & Plan Note (Signed)
Hyperlipidemia, LDL 30 02/2020, takes Atorvastatin.

## 2020-06-16 NOTE — Assessment & Plan Note (Addendum)
Bun/creat 26/1.24, GFR 38 06/07/20

## 2020-06-16 NOTE — Assessment & Plan Note (Signed)
Anemia, Hgb 9.4, Vit 12 475, Ferritin 49, Iron 51 05/28/20

## 2020-06-16 NOTE — Assessment & Plan Note (Signed)
Afib, heart rate is in control, takes Metoprolol, Eliquis.

## 2020-06-16 NOTE — Assessment & Plan Note (Signed)
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  

## 2020-06-16 NOTE — Assessment & Plan Note (Signed)
BLE edema, takes Furosemide.   

## 2020-06-17 ENCOUNTER — Encounter: Payer: Self-pay | Admitting: Nurse Practitioner

## 2020-07-27 ENCOUNTER — Non-Acute Institutional Stay (SKILLED_NURSING_FACILITY): Payer: Medicare Other | Admitting: Nurse Practitioner

## 2020-07-27 ENCOUNTER — Encounter: Payer: Self-pay | Admitting: Nurse Practitioner

## 2020-07-27 DIAGNOSIS — D631 Anemia in chronic kidney disease: Secondary | ICD-10-CM

## 2020-07-27 DIAGNOSIS — I480 Paroxysmal atrial fibrillation: Secondary | ICD-10-CM

## 2020-07-27 DIAGNOSIS — G309 Alzheimer's disease, unspecified: Secondary | ICD-10-CM | POA: Diagnosis not present

## 2020-07-27 DIAGNOSIS — N1832 Chronic kidney disease, stage 3b: Secondary | ICD-10-CM | POA: Diagnosis not present

## 2020-07-27 DIAGNOSIS — N183 Chronic kidney disease, stage 3 unspecified: Secondary | ICD-10-CM

## 2020-07-27 DIAGNOSIS — E782 Mixed hyperlipidemia: Secondary | ICD-10-CM | POA: Diagnosis not present

## 2020-07-27 DIAGNOSIS — R609 Edema, unspecified: Secondary | ICD-10-CM

## 2020-07-27 DIAGNOSIS — F0280 Dementia in other diseases classified elsewhere without behavioral disturbance: Secondary | ICD-10-CM

## 2020-07-27 NOTE — Progress Notes (Signed)
Location:    [facility] Room Number: 7 Place of Service:  SNF (31) Provider: Arna Snipe Beula Joyner NP  Mahlon Gammon, MD  Patient Care Team: Mahlon Gammon, MD as PCP - General (Internal Medicine)  Extended Emergency Contact Information Primary Emergency Contact: Marval Regal Address: [address]          [place], [place] [address] [place] Home Phone: [phone] Relation: Son  Code Status:  DNR Goals of care: Advanced Directive information Advanced Directives 04/17/2020  Does Patient Have a Medical Advance Directive? Yes  Type of Advance Directive Out of facility DNR (pink MOST or yellow form)  Does patient want to make changes to medical advance directive? No - Patient declined  Copy of Healthcare Power of Attorney in Chart? -  Would patient like information on creating a medical advance directive? -  Pre-existing out of facility DNR order (yellow form or pink MOST form) Yellow form placed in chart (order not valid for inpatient use);Pink MOST form placed in chart (order not valid for inpatient use)     Chief Complaint  Patient presents with  . Medical Management of Chronic Issues  . Health Maintenance    Urine microalbumin and dexa scan    HPI:  Pt is a 87 y.o. female seen today for medical management of chronic diseases.      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  Hx of Alzheimer's dementia,on Donepezil5mg  qd. MMSE 12/30 05/06/20 CKD, Bun/creat 26/1.24 05/28/20 Afib, heart rate is in control, takes Metoprolol, Eliquis.             Hyperlipidemia, LDL 30 02/2020, takes Atorvastatin.              BLE edema, takes Furosemide.              Anemia, Hgb 9.4, Vit 12 475, Ferritin 49, Iron 51 05/28/20  Past Medical History:  Diagnosis Date  . Alzheimer disease (HCC)   . Atrial fibrillation (HCC)    . Hypertension    History reviewed. No pertinent surgical history.  Allergies  Allergen Reactions  . Bee Venom     Bee Stings.    Allergies as of 07/27/2020      Reactions   Bee Venom    Bee Stings.      Medication List       Accurate as of July 27, 2020 11:59 PM. If you have any questions, ask your nurse or doctor.        STOP taking these medications   triamcinolone 0.1 % Commonly known as: KENALOG Stopped by: Andrzej Scully X Shamere Dilworth, NP     TAKE these medications   apixaban 5 MG Tabs tablet Commonly known as: ELIQUIS Take 5 mg by mouth 2 (two) times daily.   atorvastatin 20 MG tablet Commonly known as: LIPITOR Take 10 mg by mouth daily.   bimatoprost 0.01 % Soln Commonly known as: LUMIGAN Place 1 drop into both eyes at bedtime.   CALCIUM 600 + D PO Take 1 tablet by mouth in the morning and at bedtime.   CENTRUM SILVER PO Take 1 tablet by mouth daily. 474mcg-300mcg.   donepezil 5 MG tablet Commonly known as: ARICEPT Take 5 mg by mouth at bedtime.   furosemide 20 MG tablet Commonly known as: LASIX Take 20 mg by mouth daily.   Metoprolol Succinate 50 MG Cs24 Take 50 mg  by mouth daily. Hold if Systolic is < 95 or if pulse is less than 60   zinc oxide 20 % ointment Apply 1 application topically as needed for irritation.       Review of Systems  Constitutional: Negative for fatigue, fever and unexpected weight change.  HENT: Positive for hearing loss. Negative for congestion and trouble swallowing.   Respiratory: Negative for shortness of breath.   Cardiovascular: Positive for leg swelling.  Gastrointestinal: Negative for abdominal pain and constipation.  Genitourinary: Negative for dysuria.  Musculoskeletal: Positive for gait problem.  Skin: Negative for color change.  Neurological: Positive for speech difficulty and weakness. Negative for dizziness and headaches.       Dementia. Residual left sided weakness.   Psychiatric/Behavioral: Negative for  behavioral problems and sleep disturbance. The patient is not nervous/anxious.     Immunization History  Administered Date(s) Administered  . Influenza-Unspecified 04/02/2014, 04/01/2016, 04/14/2017, 03/17/2020  . Moderna Sars-Covid-2 Vaccination 06/17/2019, 07/15/2019, 04/27/2020  . Pneumococcal Conjugate-13 03/24/2014  . Pneumococcal Polysaccharide-23 02/26/2009  . Tdap 03/08/2012, 12/23/2018  . Zoster 09/03/2007   Pertinent  Health Maintenance Due  Topic Date Due  . URINE MICROALBUMIN  Never done  . DEXA SCAN  Never done  . INFLUENZA VACCINE  Completed  . PNA vac Low Risk Adult  Completed   No flowsheet data found. Functional Status Survey:    Vitals:   07/27/20 1603  BP: (!) 145/65  Pulse: 75  Resp: 20  Temp: (!) 97.5 F (36.4 C)  SpO2: 98%  Weight: 180 lb (81.6 kg)  Height: 5\' 1"  (1.549 m)   Body mass index is 34.01 kg/m. Physical Exam Vitals and nursing note reviewed.  Constitutional:      Appearance: Normal appearance.  HENT:     Head: Normocephalic and atraumatic.  Eyes:     Extraocular Movements: Extraocular movements intact.     Conjunctiva/sclera: Conjunctivae normal.     Pupils: Pupils are equal, round, and reactive to light.  Cardiovascular:     Rate and Rhythm: Normal rate. Rhythm irregular.     Heart sounds: No murmur heard.   Pulmonary:     Effort: Pulmonary effort is normal.     Breath sounds: No rhonchi or rales.  Abdominal:     General: Bowel sounds are normal.     Palpations: Abdomen is soft.     Tenderness: There is no abdominal tenderness.  Musculoskeletal:     Cervical back: Normal range of motion and neck supple.     Right lower leg: Edema present.     Left lower leg: Edema present.     Comments: Trace  edema BLE  Skin:    General: Skin is warm and dry.  Neurological:     Mental Status: She is alert. Mental status is at baseline.     Motor: Weakness present.     Gait: Gait abnormal.     Comments: Oriented to person and  place.  Left sided weakness. Ambulates with walker.   Psychiatric:        Mood and Affect: Mood normal.        Behavior: Behavior normal.     Labs reviewed: Recent Labs    11/12/19 2259 11/14/19 0350 11/25/19 0000 02/27/20 0000 05/28/20 0000  NA 140 136 138 140 140  K 4.4 4.3 4.0 4.1 4.3  CL 101 102 101 103 103  CO2 27 27 27* 29* 28*  GLUCOSE 104* 132*  --   --   --  BUN 27* 19 27* 23* 26*  CREATININE 1.18* 1.15* 1.2* 1.0 1.2*  CALCIUM 9.3 8.5* 8.8 8.6* 9.0   Recent Labs    11/25/19 0000 02/27/20 0000  AST 18 12*  ALT 16 9  ALKPHOS 95 81  ALBUMIN  --  3.2*   Recent Labs    11/12/19 2259 11/25/19 0000 02/27/20 0000 05/28/20 0000  WBC 7.7 9.6 6.5 5.9  NEUTROABS  --   --  4,193  --   HGB 11.6* 12.6 10.1* 9.4*  HCT 35.5* 37 31* 29*  MCV 92.9  --   --   --   PLT 239 338 243 229   Lab Results  Component Value Date   TSH 1.75 11/25/2019   Lab Results  Component Value Date   HGBA1C 5.3 11/13/2019   Lab Results  Component Value Date   CHOL 89 02/27/2020   HDL 46 02/27/2020   LDLCALC 30 02/27/2020   TRIG 51 02/27/2020   CHOLHDL 2.2 11/13/2019    Significant Diagnostic Results in last 30 days:  No results found.  Assessment/Plan Alzheimer disease (HCC) on Donepezil5mg  qd. MMSE 12/30 05/06/20, last [facility] Neurology consultation, MMSE 12/30  CKD (chronic kidney disease) stage 3, GFR 30-59 ml/min (HCC) Bun/creat 26/1.24 05/28/20  Atrial fibrillation (HCC) heart rate is in control, takes Metoprolol, Eliquis.   Hyperlipidemia LDL 30 02/2020, takes Atorvastatin.    Edema  BLE edema, takes Furosemide.    Anemia due to chronic kidney disease Hgb 9.4, Vit 12 475, Ferritin 49, Iron 51 05/28/20    Family/ staff Communication: plan of care reviewed with the patient and charge nurse.   Labs/tests ordered:  none  Time spend 35 minutes.

## 2020-07-27 NOTE — Assessment & Plan Note (Signed)
LDL 30 02/2020, takes Atorvastatin.

## 2020-07-27 NOTE — Assessment & Plan Note (Signed)
BLE edema, takes Furosemide.

## 2020-07-27 NOTE — Assessment & Plan Note (Signed)
on Donepezil5mg  qd. MMSE 12/30 05/06/20, last [facility] Neurology consultation, MMSE 12/30

## 2020-07-27 NOTE — Assessment & Plan Note (Signed)
Bun/creat 26/1.24 05/28/20

## 2020-07-27 NOTE — Assessment & Plan Note (Signed)
Hgb 9.4, Vit 12 475, Ferritin 49, Iron 51 05/28/20

## 2020-07-27 NOTE — Assessment & Plan Note (Signed)
heart rate is in control, takes Metoprolol, Eliquis.  °

## 2020-07-28 ENCOUNTER — Encounter: Payer: Self-pay | Admitting: Nurse Practitioner

## 2020-09-04 ENCOUNTER — Encounter: Payer: Self-pay | Admitting: Nurse Practitioner

## 2020-09-04 ENCOUNTER — Non-Acute Institutional Stay (SKILLED_NURSING_FACILITY): Payer: Medicare Other | Admitting: Nurse Practitioner

## 2020-09-04 DIAGNOSIS — I480 Paroxysmal atrial fibrillation: Secondary | ICD-10-CM

## 2020-09-04 DIAGNOSIS — N1832 Chronic kidney disease, stage 3b: Secondary | ICD-10-CM

## 2020-09-04 DIAGNOSIS — D631 Anemia in chronic kidney disease: Secondary | ICD-10-CM

## 2020-09-04 DIAGNOSIS — I63431 Cerebral infarction due to embolism of right posterior cerebral artery: Secondary | ICD-10-CM

## 2020-09-04 DIAGNOSIS — N183 Chronic kidney disease, stage 3 unspecified: Secondary | ICD-10-CM | POA: Diagnosis not present

## 2020-09-04 DIAGNOSIS — G309 Alzheimer's disease, unspecified: Secondary | ICD-10-CM

## 2020-09-04 DIAGNOSIS — E782 Mixed hyperlipidemia: Secondary | ICD-10-CM

## 2020-09-04 DIAGNOSIS — R609 Edema, unspecified: Secondary | ICD-10-CM | POA: Diagnosis not present

## 2020-09-04 DIAGNOSIS — F0280 Dementia in other diseases classified elsewhere without behavioral disturbance: Secondary | ICD-10-CM

## 2020-09-04 NOTE — Assessment & Plan Note (Addendum)
takes Furosemide. Update CMP/eGFR

## 2020-09-04 NOTE — Assessment & Plan Note (Signed)
Hgb 9.4, Vit 12 475, Ferritin 49, Iron 51 05/28/20, update CBC/diff.

## 2020-09-04 NOTE — Assessment & Plan Note (Signed)
Bun/creat26/1.24 05/28/20

## 2020-09-04 NOTE — Assessment & Plan Note (Signed)
LDL 30 02/2020, takes Atorvastatin.

## 2020-09-04 NOTE — Assessment & Plan Note (Signed)
heart rate is in control, takes Metoprolol, Eliquis.  °

## 2020-09-04 NOTE — Assessment & Plan Note (Signed)
on Donepezil5mg  qd. MMSE 12/30 05/06/20

## 2020-09-04 NOTE — Progress Notes (Signed)
Location:   SNF [place] Room Number: 7/A Place of Service:  SNF (31) Provider: [facility] Janann Boeve NP  Virgie Dad, MD  Patient Care Team: Virgie Dad, MD as PCP - General (Internal Medicine)  Extended Emergency Contact Information Primary Emergency Contact: [name] Address: [address]          [place], [place] [address] Johnnette Litter of [place] Phone: [phone] Relation: Son  Code Status:  DNR Goals of care: Advanced Directive information Advanced Directives 09/04/2020  Does Patient Have a Medical Advance Directive? Yes  Type of Paramedic of [place];Living will;Out of facility DNR (pink MOST or yellow form)  Does patient want to make changes to medical advance directive? No - Patient declined  Copy of [place] in Chart? Yes - validated most recent copy scanned in chart (See row information)  Would patient like information on creating a medical advance directive? -  Pre-existing out of facility DNR order (yellow form or pink MOST form) Yellow form placed in chart (order not valid for inpatient use);Pink MOST form placed in chart (order not valid for inpatient use)     Chief Complaint  Patient presents with  . Medical Management of Chronic Issues    Routine Visit of Medical Management   . Quality Metric Gaps    Discuss need for Dexa Scan     HPI:  Pt is a 87 y.o. female seen today for medical management of chronic diseases.      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 Hx of Alzheimer's dementia,on Donepezil51m qd. MMSE 12/30 05/06/20 CKD, Bun/creat26/1.24 05/28/20 Afib, heart rate is in control, takes Metoprolol, Eliquis. Hyperlipidemia, LDL 30 02/2020, takes Atorvastatin.  BLE edema, takes Furosemide.  Anemia, Hgb 9.4, Vit 12  475, Ferritin 49, Iron 51 05/28/20   Past Medical History:  Diagnosis Date  . Alzheimer disease ([place]   . Atrial fibrillation ([place]   . Hypertension    History reviewed. No pertinent surgical history.  Allergies  Allergen Reactions  . Bee Venom     Bee Stings.    Allergies as of 09/04/2020      Reactions   Bee Venom    Bee Stings.      Medication List       Accurate as of September 04, 2020 11:59 PM. If you have any questions, ask your nurse or doctor.        apixaban 5 MG Tabs tablet Commonly known as: ELIQUIS Take 5 mg by mouth 2 (two) times daily.   atorvastatin 20 MG tablet Commonly known as: LIPITOR Take 10 mg by mouth daily.   bimatoprost 0.01 % Soln Commonly known as: LUMIGAN Place 1 drop into both eyes at bedtime.   CALCIUM 600 + D PO Take 1 tablet by mouth in the morning and at bedtime.   CENTRUM SILVER PO Take 1 tablet by mouth daily. 4073m-300mcg.   donepezil 5 MG tablet Commonly known as: ARICEPT Take 5 mg by mouth at bedtime.   furosemide 20 MG tablet Commonly known as: LASIX Take 20 mg by mouth daily.   Metoprolol Succinate 50 MG Cs24 Take 50 mg by mouth daily. Hold if Systolic is < 95 or if pulse is less than 60   zinc oxide 20 % ointment Apply 1 application topically as needed for irritation.       Review of Systems  Constitutional: Negative for activity change, fever and unexpected weight change.  HENT: Positive for hearing loss. Negative for congestion and trouble swallowing.   Respiratory: Negative for shortness of breath.   Cardiovascular: Positive for leg swelling.  Gastrointestinal: Negative for abdominal pain and constipation.  Genitourinary: Negative for dysuria.  Musculoskeletal: Positive for gait problem.  Skin: Negative for color change.  Neurological: Positive for speech difficulty and weakness. Negative for dizziness and headaches.       Dementia. Residual left sided weakness.   Psychiatric/Behavioral: Negative for  behavioral problems and sleep disturbance. The patient is not nervous/anxious.     Immunization History  Administered Date(s) Administered  . Influenza-Unspecified 04/02/2014, 04/01/2016, 04/14/2017, 03/17/2020  . Moderna Sars-Covid-2 Vaccination 06/17/2019, 07/15/2019, 04/27/2020  . Pneumococcal Conjugate-13 03/24/2014  . Pneumococcal Polysaccharide-23 02/26/2009  . Tdap 03/08/2012, 12/23/2018  . Zoster 09/03/2007   Pertinent  Health Maintenance Due  Topic Date Due  . DEXA SCAN  Never done  . INFLUENZA VACCINE  Completed  . PNA vac Low Risk Adult  Completed   No flowsheet data found. Functional Status Survey:    Vitals:   09/04/20 1630  BP: (!) 144/80  Pulse: 79  Resp: 20  Temp: (!) 97.1 F (36.2 C)  SpO2: 94%  Weight: 175 lb (79.4 kg)  Height: '5\' 1"'  (1.549 m)   Body mass index is 33.07 kg/m. Physical Exam Vitals and nursing note reviewed.  Constitutional:      Appearance: Normal appearance.  HENT:     Head: Normocephalic and atraumatic.  Eyes:     Extraocular Movements: Extraocular movements intact.     Conjunctiva/sclera: Conjunctivae normal.     Pupils: Pupils are equal, round, and reactive to light.  Cardiovascular:     Rate and Rhythm: Normal rate. Rhythm irregular.     Heart sounds: No murmur heard.   Pulmonary:     Effort: Pulmonary effort is normal.     Breath sounds: No rhonchi or rales.  Abdominal:     General: Bowel sounds are normal.     Palpations: Abdomen is soft.     Tenderness: There is no abdominal tenderness.  Musculoskeletal:     Cervical back: Normal range of motion and neck supple.     Right lower leg: Edema present.     Left lower leg: Edema present.     Comments: Trace  edema BLE  Skin:    General: Skin is warm and dry.  Neurological:     Mental Status: She is alert. Mental status is at baseline.     Motor: Weakness present.     Gait: Gait abnormal.     Comments: Oriented to person and place.  Left sided weakness. Ambulates  with walker.   Psychiatric:        Mood and Affect: Mood normal.        Behavior: Behavior normal.     Labs reviewed: Recent Labs    11/12/19 2259 11/14/19 0350 11/25/19 0000 02/27/20 0000 05/28/20 0000  NA 140 136 138 140 140  K 4.4 4.3 4.0 4.1 4.3  CL 101 102 101 103 103  CO2 27 27 27* 29* 28*  GLUCOSE 104* 132*  --   --   --   BUN 27* 19 27* 23* 26*  CREATININE 1.18* 1.15* 1.2* 1.0 1.2*  CALCIUM 9.3 8.5* 8.8 8.6* 9.0   Recent Labs    11/25/19 0000 02/27/20 0000  AST 18 12*  ALT 16 9  ALKPHOS 95 81  ALBUMIN  --  3.2*   Recent Labs    11/12/19 2259 11/25/19 0000 02/27/20 0000 05/28/20 0000  WBC 7.7 9.6 6.5 5.9  NEUTROABS  --   --  4,193  --   HGB 11.6* 12.6 10.1* 9.4*  HCT 35.5* 37 31* 29*  MCV 92.9  --   --   --   PLT 239 338 243 229   Lab Results  Component Value Date   TSH 1.75 11/25/2019   Lab Results  Component Value Date   HGBA1C 5.3 11/13/2019   Lab Results  Component Value Date   CHOL 89 02/27/2020   HDL 46 02/27/2020   LDLCALC 30 02/27/2020   TRIG 51 02/27/2020   CHOLHDL 2.2 11/13/2019    Significant Diagnostic Results in last 30 days:  No results found.  Assessment/Plan Hyperlipidemia LDL 30 02/2020, takes Atorvastatin.    Edema  takes Furosemide. Update CMP/eGFR  Anemia due to chronic kidney disease Hgb 9.4, Vit 12 475, Ferritin 49, Iron 51 05/28/20, update CBC/diff.     Atrial fibrillation (HCC) heart rate is in control, takes Metoprolol, Eliquis.  CKD (chronic kidney disease) stage 3, GFR 30-59 ml/min (HCC) Bun/creat26/1.24 05/28/20   Alzheimer disease ([place]) on Donepezil89m qd. 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    Family/ staff Communication: plan of care reviewed with the patient and charge nurse.   Labs/tests ordered: none  Time spend 35 minutes.

## 2020-09-04 NOTE — Assessment & Plan Note (Signed)
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

## 2020-09-07 LAB — HEPATIC FUNCTION PANEL
ALT: 13 (ref 7–35)
AST: 17 (ref 13–35)
Alkaline Phosphatase: 98 (ref 25–125)
Bilirubin, Total: 0.7

## 2020-09-07 LAB — BASIC METABOLIC PANEL
BUN: 24 — AB (ref 4–21)
CO2: 27 — AB (ref 13–22)
Chloride: 101 (ref 99–108)
Creatinine: 1.1 (ref 0.5–1.1)
Glucose: 103
Potassium: 4.4 (ref 3.4–5.3)
Sodium: 139 (ref 137–147)

## 2020-09-07 LAB — CBC AND DIFFERENTIAL
HCT: 38 (ref 36–46)
Hemoglobin: 12.3 (ref 12.0–16.0)
Neutrophils Absolute: 6134
Platelets: 264 (ref 150–399)
WBC: 9.6

## 2020-09-07 LAB — COMPREHENSIVE METABOLIC PANEL
Albumin: 3.9 (ref 3.5–5.0)
Calcium: 9.5 (ref 8.7–10.7)
Globulin: 2.7

## 2020-09-07 LAB — CBC: RBC: 4.25 (ref 3.87–5.11)

## 2020-09-08 ENCOUNTER — Encounter: Payer: Self-pay | Admitting: Nurse Practitioner

## 2020-09-19 ENCOUNTER — Encounter (HOSPITAL_COMMUNITY): Payer: Self-pay

## 2020-09-19 ENCOUNTER — Other Ambulatory Visit: Payer: Self-pay

## 2020-09-19 ENCOUNTER — Emergency Department (HOSPITAL_COMMUNITY): Payer: Medicare Other

## 2020-09-19 ENCOUNTER — Emergency Department (HOSPITAL_COMMUNITY)
Admission: EM | Admit: 2020-09-19 | Discharge: 2020-09-19 | Disposition: A | Discharge status: Home / Self Care | Payer: Medicare Other | Attending: Emergency Medicine | Admitting: Emergency Medicine

## 2020-09-19 DIAGNOSIS — W19XXXA Unspecified fall, initial encounter: Secondary | ICD-10-CM | POA: Diagnosis not present

## 2020-09-19 DIAGNOSIS — N183 Chronic kidney disease, stage 3 unspecified: Secondary | ICD-10-CM | POA: Diagnosis not present

## 2020-09-19 DIAGNOSIS — S0001XA Abrasion of scalp, initial encounter: Secondary | ICD-10-CM

## 2020-09-19 DIAGNOSIS — Z79899 Other long term (current) drug therapy: Secondary | ICD-10-CM | POA: Diagnosis not present

## 2020-09-19 DIAGNOSIS — Z7901 Long term (current) use of anticoagulants: Secondary | ICD-10-CM | POA: Insufficient documentation

## 2020-09-19 DIAGNOSIS — I129 Hypertensive chronic kidney disease with stage 1 through stage 4 chronic kidney disease, or unspecified chronic kidney disease: Secondary | ICD-10-CM | POA: Diagnosis not present

## 2020-09-19 DIAGNOSIS — S7001XA Contusion of right hip, initial encounter: Secondary | ICD-10-CM

## 2020-09-19 DIAGNOSIS — G309 Alzheimer's disease, unspecified: Secondary | ICD-10-CM | POA: Diagnosis not present

## 2020-09-19 DIAGNOSIS — Y92838 Other recreation area as the place of occurrence of the external cause: Secondary | ICD-10-CM | POA: Diagnosis not present

## 2020-09-19 DIAGNOSIS — D631 Anemia in chronic kidney disease: Secondary | ICD-10-CM | POA: Insufficient documentation

## 2020-09-19 DIAGNOSIS — I4891 Unspecified atrial fibrillation: Secondary | ICD-10-CM | POA: Insufficient documentation

## 2020-09-19 DIAGNOSIS — S0990XA Unspecified injury of head, initial encounter: Secondary | ICD-10-CM | POA: Diagnosis present

## 2020-09-19 LAB — CBC
HCT: 39.6 % (ref 36.0–46.0)
Hemoglobin: 12.9 g/dL (ref 12.0–15.0)
MCH: 30.1 pg (ref 26.0–34.0)
MCHC: 32.6 g/dL (ref 30.0–36.0)
MCV: 92.3 fL (ref 80.0–100.0)
Platelets: 286 10*3/uL (ref 150–400)
RBC: 4.29 MIL/uL (ref 3.87–5.11)
RDW: 16.1 % — ABNORMAL HIGH (ref 11.5–15.5)
WBC: 12.2 10*3/uL — ABNORMAL HIGH (ref 4.0–10.5)
nRBC: 0 % (ref 0.0–0.2)

## 2020-09-19 LAB — BASIC METABOLIC PANEL
Anion gap: 7 (ref 5–15)
BUN: 20 mg/dL (ref 8–23)
CO2: 29 mmol/L (ref 22–32)
Calcium: 9.3 mg/dL (ref 8.9–10.3)
Chloride: 99 mmol/L (ref 98–111)
Creatinine, Ser: 1.04 mg/dL — ABNORMAL HIGH (ref 0.44–1.00)
GFR, Estimated: 51 mL/min — ABNORMAL LOW (ref >60)
Glucose, Bld: 139 mg/dL — ABNORMAL HIGH (ref 70–99)
Potassium: 4.1 mmol/L (ref 3.5–5.1)
Sodium: 135 mmol/L (ref 135–145)

## 2020-09-19 NOTE — ED Notes (Signed)
Record currently locked, pt d/c now with [facility]. Will d/c when record is clear

## 2020-09-19 NOTE — ED Triage Notes (Signed)
Pt was found on the floor at breakfast passing at 8-830 this an unwitnessed and unknown last normal.  Last known normal last night.  Bleeding controlled to laceration to the right side of head

## 2020-09-19 NOTE — Progress Notes (Signed)
Chaplain responded to Trauma Level 2 fall on blood thinners. Pt not available.  No family present.  Chaplain spoke to EMS, who said family had been notified, and will probably come.  Please advise if family arrives or if support is needed.  Belia Heman, [place]  [phone]      09/19/20 [phone]  Clinical Encounter Type  Visited With Patient not available  Visit Type Initial;Trauma  Referral From Physician  Consult/Referral To Chaplain  Stress Factors  Patient Stress Factors Health changes

## 2020-09-19 NOTE — ED Notes (Signed)
[facility] CALLED BY Karrina Lye UNABLE TO GIVE TIME  

## 2020-09-19 NOTE — ED Provider Notes (Signed)
[facility] EMERGENCY DEPARTMENT Provider Note   CSN: [id number] Arrival date & time: 09/19/20  7412     History Chief Complaint  Patient presents with  . Fall    Jasmine Woodward is a 87 y.o. female.  HPI Level 5 caveat secondary to dementia 87 year old female brought in via EMS.  History is obtained via EMS.  She is is here from a dementia unit at friend's home.  She was found on the floor near the bathroom and it was thought that she likely had fallen returning from the bathroom.  She has an abrasion to her head.  She was stood and walked after the event.  EMS reports that she was stable and route.  She has baseline confusion due to her dementia that is reported that this is normal.  There was some minor tremor of the hand that was thought to possibly be new.  No other injuries were noted.    Past Medical History:  Diagnosis Date  . Alzheimer disease (HCC)   . Atrial fibrillation (HCC)   . Hypertension     Patient Active Problem List   Diagnosis Date Noted  . Edema 06/16/2020  . Hyperlipidemia 06/16/2020  . Anemia due to chronic kidney disease 06/16/2020  . Acute focal neurological deficit 11/13/2019  . Stroke (HCC) 11/13/2019  . Hypertension   . Atrial fibrillation (HCC)   . Alzheimer disease (HCC)   . CKD (chronic kidney disease) stage 3, GFR 30-59 ml/min (HCC)     History reviewed. No pertinent surgical history.   OB History   No obstetric history on file.     No family history on file.  Social History   Tobacco Use  . Smoking status: Never Smoker  . Smokeless tobacco: Never Used  Vaping Use  . Vaping Use: Unknown  Substance Use Topics  . Alcohol use: Not Currently  . Drug use: Never    Home Medications Prior to Admission medications   Medication Sig Start Date End Date Taking? Authorizing Provider  apixaban (ELIQUIS) 5 MG TABS tablet Take 5 mg by mouth 2 (two) times daily.    [provider]  atorvastatin (LIPITOR) 20  MG tablet Take 10 mg by mouth daily.     [provider]  bimatoprost (LUMIGAN) 0.01 % SOLN Place 1 drop into both eyes at bedtime.     [provider]  Calcium Carb-Cholecalciferol (CALCIUM 600 + D PO) Take 1 tablet by mouth in the morning and at bedtime.    [provider]  donepezil (ARICEPT) 5 MG tablet Take 5 mg by mouth at bedtime.    [provider]  furosemide (LASIX) 20 MG tablet Take 20 mg by mouth daily.    [provider]  Metoprolol Succinate 50 MG CS24 Take 50 mg by mouth daily. Hold if Systolic is < 95 or if pulse is less than 60    [provider]  Multiple Vitamins-Minerals (CENTRUM SILVER PO) Take 1 tablet by mouth daily. 46mcg-300mcg.    [provider]  zinc oxide 20 % ointment Apply 1 application topically as needed for irritation.    [provider]    Allergies    Bee venom  Review of Systems   Review of Systems  Unable to perform ROS: Dementia    Physical Exam Updated Vital Signs BP 130/60 (BP Location: Right Arm)   Pulse 75   Temp 98.2 F (36.8 C) (Oral)   Resp 18  SpO2 100%   Physical Exam Vitals and nursing note reviewed.  Constitutional:      General: She is not in acute distress.    Appearance: She is not ill-appearing.  HENT:     Head: Normocephalic.     Comments: Abrasion right anterior head    Right Ear: External ear normal.     Left Ear: External ear normal.     Nose: Nose normal.     Mouth/Throat:     Pharynx: Oropharynx is clear.  Eyes:     Extraocular Movements: Extraocular movements intact.     Pupils: Pupils are equal, round, and reactive to light.  Neck:     Comments: Cervical collar in place Cardiovascular:     Rate and Rhythm: Normal rate and regular rhythm.  Pulmonary:     Effort: Pulmonary effort is normal.     Breath sounds: Normal breath sounds.     Comments: No external signs of trauma noted to chest wall Lungs are clear to auscultation No  crepitus or tenderness noted Abdominal:     General: Abdomen is flat.     Palpations: Abdomen is soft.     Comments: No external signs of trauma are noted to abdominal area No tenderness to palpation noted  Musculoskeletal:        General: Normal range of motion.     Comments: Full active range of motion bilateral arms and legs There is a contusion in the right hip area that is purple in color No tenderness is noted in full active range of motion is noted No point tenderness noted over cervical, thoracic, or lumbar spine No obvious external signs of trauma to lower back  Skin:    Capillary Refill: Capillary refill takes less than 2 seconds.  Neurological:     General: No focal deficit present.     Mental Status: She is alert.     Motor: No weakness.     Coordination: Coordination normal.  Psychiatric:        Mood and Affect: Mood normal.     ED Results / Procedures / Treatments   Labs (all labs ordered are listed, but only abnormal results are displayed) Labs Reviewed  CBC  BASIC METABOLIC PANEL    EKG None  Radiology CT Head Wo Contrast  Result Date: 09/19/2020 CLINICAL DATA:  Minor head trauma. Level 2 trauma on blood thinners. EXAM: CT HEAD WITHOUT CONTRAST CT CERVICAL SPINE WITHOUT CONTRAST TECHNIQUE: Multidetector CT imaging of the head and cervical spine was performed following the standard protocol without intravenous contrast. Multiplanar CT image reconstructions of the cervical spine were also generated. COMPARISON:  11/13/2019 FINDINGS: CT HEAD FINDINGS Brain: Small focus of nodular high-density left parafalcine is unchanged from 2021 and nontraumatic. Remote right occipital infarct. Biparietal encephalomalacia and cortical thinning. Brain atrophy heavily affecting the medial temporal lobes. Vascular: No hyperdense vessel or unexpected calcification. Skull: Generalized scalp swelling. Sinuses/Orbits: Bilateral cataract resection. Chronic right sphenoid sinusitis with  patent ostium. CT CERVICAL SPINE FINDINGS Alignment: No traumatic malalignment Skull base and vertebrae: No acute fracture. No primary bone lesion or focal pathologic process. Soft tissues and spinal canal: No prevertebral fluid or swelling. No visible canal hematoma. Disc levels: Generalized degenerative disc narrowing and ridging. Milder facet osteoarthritis. Upper chest: Negative IMPRESSION: 1. No evidence of acute intracranial or cervical spine injury. 2. Generalized scalp swelling without calvarial fracture. 3. Chronic cerebral infarcts. 4. Advanced bilateral temporal lobe atrophy. Electronically Signed   By: Kathrynn Ducking.D.  On: 09/19/2020 11:02   CT Cervical Spine Wo Contrast  Result Date: 09/19/2020 CLINICAL DATA:  Minor head trauma. Level 2 trauma on blood thinners. EXAM: CT HEAD WITHOUT CONTRAST CT CERVICAL SPINE WITHOUT CONTRAST TECHNIQUE: Multidetector CT imaging of the head and cervical spine was performed following the standard protocol without intravenous contrast. Multiplanar CT image reconstructions of the cervical spine were also generated. COMPARISON:  11/13/2019 FINDINGS: CT HEAD FINDINGS Brain: Small focus of nodular high-density left parafalcine is unchanged from 2021 and nontraumatic. Remote right occipital infarct. Biparietal encephalomalacia and cortical thinning. Brain atrophy heavily affecting the medial temporal lobes. Vascular: No hyperdense vessel or unexpected calcification. Skull: Generalized scalp swelling. Sinuses/Orbits: Bilateral cataract resection. Chronic right sphenoid sinusitis with patent ostium. CT CERVICAL SPINE FINDINGS Alignment: No traumatic malalignment Skull base and vertebrae: No acute fracture. No primary bone lesion or focal pathologic process. Soft tissues and spinal canal: No prevertebral fluid or swelling. No visible canal hematoma. Disc levels: Generalized degenerative disc narrowing and ridging. Milder facet osteoarthritis. Upper chest: Negative  IMPRESSION: 1. No evidence of acute intracranial or cervical spine injury. 2. Generalized scalp swelling without calvarial fracture. 3. Chronic cerebral infarcts. 4. Advanced bilateral temporal lobe atrophy. Electronically Signed   By: Marnee Spring M.D.   On: 09/19/2020 11:02   DG Hip Unilat W or Wo Pelvis 2-3 Views Right  Result Date: 09/19/2020 CLINICAL DATA:  Unwitnessed fall. EXAM: DG HIP (WITH OR WITHOUT PELVIS) 2-3V RIGHT COMPARISON:  None. FINDINGS: There is no evidence of hip fracture or dislocation. There is no evidence of focal bone abnormality. Moderate osteoarthritic changes of the bilateral hips and the visualized portion of the lower lumbosacral spine. IMPRESSION: 1. No acute fracture or dislocation identified about the right hip. 2. 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 Electronically Signed   By: Ted Mcalpine M.D.   On: 09/19/2020 11:12    Procedures Procedures   Medications Ordered in ED Medications - No data to display  ED Course  I have reviewed the triage vital signs and the nursing notes.  Pertinent labs & imaging results that were available during my care of the patient were reviewed by me and considered in my medical decision making (see chart for details).    MDM Rules/Calculators/A&P                          87 year old female on blood thinners who has a history of dementia and was found on the ground today.  Patient with apparent fall.  She has abrasion to her head.  CT obtained of her head neck without evidence of acute abnormality.  There is a bruise noted over the right hip.  She has ambulated prior to coming into the ED.  The bruise on the right hip appears purple which would be consistent with something prior to today.  Radiology imaging of the right hip obtained showed no evidence of fracture.  Patient appears hemodynamically stable here will be discharged back to her facility. Final Clinical Impression(s) / ED Diagnoses Final  diagnoses:  Fall, initial encounter  Abrasion of scalp, initial encounter  Contusion of right hip, initial encounter    Rx / DC Orders ED Discharge Orders    None       Margarita Grizzle, MD 09/19/20 1125

## 2020-09-19 NOTE — ED Notes (Signed)
Patient transported to CT with monitors and RN

## 2020-09-19 NOTE — ED Notes (Signed)
[facility] being called will call and update the son pt stood with assist and a few steps forward ataxic but aware of surroundings walks backwards better than forward.  No pain with ambulation. Antibiotic ointment applied to head lac.

## 2020-09-19 NOTE — Progress Notes (Signed)
Orthopedic Tech Progress Note Patient Details:  Jasmine Woodward 1928/04/02 [id number] Level 2 trauma Patient ID: Tonna Boehringer, female   DOB: 01-May-1928, 87 y.o.   MRN: [id number]   Michelle Piper 09/19/2020, 10:33 AM

## 2020-09-19 NOTE — Discharge Instructions (Addendum)
Patient appeared to have a fall.  X-rays were obtained of the hip due to a bruise.  There is no evidence of a broken bone.  X-rays are obtained of the head and neck no evidence of acute bleeding or neck injury.  Otherwise patient appears unable and she appears stable for discharge back to her facility

## 2020-09-22 ENCOUNTER — Encounter: Payer: Self-pay | Admitting: Nurse Practitioner

## 2020-09-22 ENCOUNTER — Non-Acute Institutional Stay (SKILLED_NURSING_FACILITY): Payer: Medicare Other | Admitting: Nurse Practitioner

## 2020-09-22 DIAGNOSIS — N183 Chronic kidney disease, stage 3 unspecified: Secondary | ICD-10-CM

## 2020-09-22 DIAGNOSIS — S0101XD Laceration without foreign body of scalp, subsequent encounter: Secondary | ICD-10-CM | POA: Diagnosis not present

## 2020-09-22 DIAGNOSIS — W19XXXD Unspecified fall, subsequent encounter: Secondary | ICD-10-CM | POA: Diagnosis not present

## 2020-09-22 DIAGNOSIS — D631 Anemia in chronic kidney disease: Secondary | ICD-10-CM

## 2020-09-22 DIAGNOSIS — R609 Edema, unspecified: Secondary | ICD-10-CM

## 2020-09-22 DIAGNOSIS — N1832 Chronic kidney disease, stage 3b: Secondary | ICD-10-CM

## 2020-09-22 DIAGNOSIS — E782 Mixed hyperlipidemia: Secondary | ICD-10-CM

## 2020-09-22 DIAGNOSIS — W19XXXA Unspecified fall, initial encounter: Secondary | ICD-10-CM | POA: Insufficient documentation

## 2020-09-22 DIAGNOSIS — F0280 Dementia in other diseases classified elsewhere without behavioral disturbance: Secondary | ICD-10-CM

## 2020-09-22 DIAGNOSIS — G309 Alzheimer's disease, unspecified: Secondary | ICD-10-CM

## 2020-09-22 DIAGNOSIS — I480 Paroxysmal atrial fibrillation: Secondary | ICD-10-CM

## 2020-09-22 NOTE — Assessment & Plan Note (Signed)
heart rate is in control, takes Metoprolol, Eliquis.  °

## 2020-09-22 NOTE — Assessment & Plan Note (Signed)
edema, takes Furosemide.

## 2020-09-22 NOTE — Assessment & Plan Note (Signed)
Hx of Alzheimer's dementia,on Donepezil5mg  qd. MMSE 12/30 05/06/20

## 2020-09-22 NOTE — Assessment & Plan Note (Signed)
Mechanical fall, forget using walker in setting of her baseline gait abnormality contributes to her falling, close supervision/assistance needed for safety.

## 2020-09-22 NOTE — Assessment & Plan Note (Signed)
CKD, Bun/creat201.04 09/19/20

## 2020-09-22 NOTE — Assessment & Plan Note (Signed)
Vit B 12 475, Ferritin 49, Iron 51 05/28/20, Hgb 12.9 09/19/20

## 2020-09-22 NOTE — Progress Notes (Signed)
Location:    [facility] Room Number: 7 Place of Service:  SNF (31) Provider: [facility] Julienne Vogler NP  Mahlon Gammon, MD  Patient Care Team: Mahlon Gammon, MD as PCP - General (Internal Medicine)  Extended Emergency Contact Information Primary Emergency Contact: Marval Regal Address: [address]          [place], [place] [address] D[place] of [place] Home Phone: [phone] Relation: Son  Code Status:  DNR Goals of care: Advanced Directive information Advanced Directives 09/04/2020  Does Patient Have a Medical Advance Directive? Yes  Type of Estate agent of [place];Living will;Out of facility DNR (pink MOST or yellow form)  Does patient want to make changes to medical advance directive? No - Patient declined  Copy of Healthcare Power of Attorney in Chart? Yes - validated most recent copy scanned in chart (See row information)  Would patient like information on creating a medical advance directive? -  Pre-existing out of facility DNR order (yellow form or pink MOST form) Yellow form placed in chart (order not valid for inpatient use);Pink MOST form placed in chart (order not valid for inpatient use)     Chief Complaint  Patient presents with  . Acute Visit    F/u ED eval for pain, scalp laceration.     HPI:  Pt is a 87 y.o. female seen today for an acute visit for f/u ED eval for fall, resulted right periatal scalp laceration, right hip bruise, negative 09/19/20 CT cervical spine/head no acute process, R hip X-ray. The patient was walking to her bathroom upon my entering her room, she is in her usual state of health today.      Hx of Alzheimer's dementia,on Donepezil5mg  qd. MMSE 12/30 05/06/20 CKD, Bun/creat201.04 09/19/20 Afib, heart rate is in control, takes Metoprolol, Eliquis. Hyperlipidemia, LDL 30 02/2020, takes Atorvastatin.  BLE edema, takes Furosemide.  Anemia, Vit 12  475, Ferritin 49, Iron 51 05/28/20, Hgb 12.9 09/19/20 Past Medical History:  Diagnosis Date  . Alzheimer disease (HCC)   . Atrial fibrillation (HCC)   . Hypertension    History reviewed. No pertinent surgical history.  Allergies  Allergen Reactions  . Bee Venom Other (See Comments)    Listed on MAR    Allergies as of 09/22/2020      Reactions   Bee Venom Other (See Comments)   Listed on [facility]      Medication List       Accurate as of September 22, 2020  2:36 PM. If you have any questions, ask your nurse or doctor.        apixaban 5 MG Tabs tablet Commonly known as: ELIQUIS Take 5 mg by mouth 2 (two) times daily.   atorvastatin 20 MG tablet Commonly known as: LIPITOR Take 10 mg by mouth at bedtime.   bimatoprost 0.01 % Soln Commonly known as: LUMIGAN Place 1 drop into both eyes at bedtime.   CALCIUM 600 + D PO Take 1 tablet by mouth in the morning and at bedtime.   CENTRUM SILVER PO Take 1 tablet by mouth daily.   donepezil 5 MG tablet Commonly known as: ARICEPT Take 5 mg by mouth at bedtime.   furosemide 20 MG tablet Commonly known as: LASIX Take 20 mg by mouth daily.   Metoprolol Succinate 50 MG Cs24 Take 50 mg by mouth daily. Hold if Systolic is < 95 or if pulse is less than 60   zinc oxide 20 % ointment Apply 1  application topically as needed for irritation. Apply to buttocks after every incontinent episode and as needed for redness       Review of Systems  Constitutional: Negative for appetite change, fatigue and fever.  HENT: Positive for hearing loss. Negative for congestion and trouble swallowing.   Respiratory: Negative for shortness of breath.   Cardiovascular: Positive for leg swelling.  Gastrointestinal: Negative for abdominal pain and constipation.  Genitourinary: Negative for dysuria.  Musculoskeletal: Positive for gait problem.  Skin: Positive for wound. Negative for color change.  Neurological: Positive for speech difficulty and weakness.  Negative for dizziness and headaches.       Dementia. Residual left sided weakness.   Psychiatric/Behavioral: Negative for behavioral problems and sleep disturbance. The patient is not nervous/anxious.     Immunization History  Administered Date(s) Administered  . Influenza-Unspecified 04/02/2014, 04/01/2016, 04/14/2017, 03/17/2020  . Moderna Sars-Covid-2 Vaccination 06/17/2019, 07/15/2019, 04/27/2020  . Pneumococcal Conjugate-13 03/24/2014  . Pneumococcal Polysaccharide-23 02/26/2009  . Tdap 03/08/2012, 12/23/2018  . Zoster 09/03/2007   Pertinent  Health Maintenance Due  Topic Date Due  . DEXA SCAN  Never done  . INFLUENZA VACCINE  01/11/2021  . PNA vac Low Risk Adult  Completed   No flowsheet data found. Functional Status Survey:    Vitals:   09/22/20 1418  BP: 132/86  Pulse: 77  Resp: 18  Temp: (!) 96.9 F (36.1 C)  SpO2: 98%   There is no height or weight on file to calculate BMI. Physical Exam Vitals and nursing note reviewed.  Constitutional:      Appearance: Normal appearance.  HENT:     Head: Normocephalic and atraumatic.  Eyes:     Extraocular Movements: Extraocular movements intact.     Conjunctiva/sclera: Conjunctivae normal.     Pupils: Pupils are equal, round, and reactive to light.  Cardiovascular:     Rate and Rhythm: Normal rate. Rhythm irregular.     Heart sounds: No murmur heard.   Pulmonary:     Effort: Pulmonary effort is normal.     Breath sounds: No rhonchi or rales.  Abdominal:     General: Bowel sounds are normal.     Palpations: Abdomen is soft.     Tenderness: There is no abdominal tenderness.  Musculoskeletal:     Cervical back: Normal range of motion and neck supple.     Right lower leg: Edema present.     Left lower leg: Edema present.     Comments: Trace  edema BLE  Skin:    General: Skin is warm and dry.     Findings: Bruising present.     Comments: The right parietal scalp laceration, no active bleeding or s/s of  infection. A bruise right hip noted, no pain palpated.   Neurological:     Mental Status: She is alert. Mental status is at baseline.     Motor: Weakness present.     Gait: Gait abnormal.     Comments: Oriented to person and place.  Left sided weakness. Ambulates with walker.   Psychiatric:        Mood and Affect: Mood normal.        Behavior: Behavior normal.     Labs reviewed: Recent Labs    11/12/19 2259 11/14/19 0350 11/25/19 0000 02/27/20 0000 05/28/20 0000 09/19/20 1003  NA 140 136   < > 140 140 135  K 4.4 4.3   < > 4.1 4.3 4.1  CL 101 102   < >  103 103 99  CO2 27 27   < > 29* 28* 29  GLUCOSE 104* 132*  --   --   --  139*  BUN 27* 19   < > 23* 26* 20  CREATININE 1.18* 1.15*   < > 1.0 1.2* 1.04*  CALCIUM 9.3 8.5*   < > 8.6* 9.0 9.3   < > = values in this interval not displayed.   Recent Labs    11/25/19 0000 02/27/20 0000  AST 18 12*  ALT 16 9  ALKPHOS 95 81  ALBUMIN  --  3.2*   Recent Labs    11/12/19 2259 11/25/19 0000 02/27/20 0000 05/28/20 0000 09/19/20 1003  WBC 7.7   < > 6.5 5.9 12.2*  NEUTROABS  --   --  4,193  --   --   HGB 11.6*   < > 10.1* 9.4* 12.9  HCT 35.5*   < > 31* 29* 39.6  MCV 92.9  --   --   --  92.3  PLT 239   < > 243 229 286   < > = values in this interval not displayed.   Lab Results  Component Value Date   TSH 1.75 11/25/2019   Lab Results  Component Value Date   HGBA1C 5.3 11/13/2019   Lab Results  Component Value Date   CHOL 89 02/27/2020   HDL 46 02/27/2020   LDLCALC 30 02/27/2020   TRIG 51 02/27/2020   CHOLHDL 2.2 11/13/2019    Significant Diagnostic Results in last 30 days:  CT Head Wo Contrast  Result Date: 09/19/2020 CLINICAL DATA:  Minor head trauma. Level 2 trauma on blood thinners. EXAM: CT HEAD WITHOUT CONTRAST CT CERVICAL SPINE WITHOUT CONTRAST TECHNIQUE: Multidetector CT imaging of the head and cervical spine was performed following the standard protocol without intravenous contrast. Multiplanar CT image  reconstructions of the cervical spine were also generated. COMPARISON:  11/13/2019 FINDINGS: CT HEAD FINDINGS Brain: Small focus of nodular high-density left parafalcine is unchanged from 2021 and nontraumatic. Remote right occipital infarct. Biparietal encephalomalacia and cortical thinning. Brain atrophy heavily affecting the medial temporal lobes. Vascular: No hyperdense vessel or unexpected calcification. Skull: Generalized scalp swelling. Sinuses/Orbits: Bilateral cataract resection. Chronic right sphenoid sinusitis with patent ostium. CT CERVICAL SPINE FINDINGS Alignment: No traumatic malalignment Skull base and vertebrae: No acute fracture. No primary bone lesion or focal pathologic process. Soft tissues and spinal canal: No prevertebral fluid or swelling. No visible canal hematoma. Disc levels: Generalized degenerative disc narrowing and ridging. Milder facet osteoarthritis. Upper chest: Negative IMPRESSION: 1. No evidence of acute intracranial or cervical spine injury. 2. Generalized scalp swelling without calvarial fracture. 3. Chronic cerebral infarcts. 4. Advanced bilateral temporal lobe atrophy. Electronically Signed   By: Marnee Spring M.D.   On: 09/19/2020 11:02   CT Cervical Spine Wo Contrast  Result Date: 09/19/2020 CLINICAL DATA:  Minor head trauma. Level 2 trauma on blood thinners. EXAM: CT HEAD WITHOUT CONTRAST CT CERVICAL SPINE WITHOUT CONTRAST TECHNIQUE: Multidetector CT imaging of the head and cervical spine was performed following the standard protocol without intravenous contrast. Multiplanar CT image reconstructions of the cervical spine were also generated. COMPARISON:  11/13/2019 FINDINGS: CT HEAD FINDINGS Brain: Small focus of nodular high-density left parafalcine is unchanged from 2021 and nontraumatic. Remote right occipital infarct. Biparietal encephalomalacia and cortical thinning. Brain atrophy heavily affecting the medial temporal lobes. Vascular: No hyperdense vessel or  unexpected calcification. Skull: Generalized scalp swelling. Sinuses/Orbits: Bilateral cataract resection. Chronic right  sphenoid sinusitis with patent ostium. CT CERVICAL SPINE FINDINGS Alignment: No traumatic malalignment Skull base and vertebrae: No acute fracture. No primary bone lesion or focal pathologic process. Soft tissues and spinal canal: No prevertebral fluid or swelling. No visible canal hematoma. Disc levels: Generalized degenerative disc narrowing and ridging. Milder facet osteoarthritis. Upper chest: Negative IMPRESSION: 1. No evidence of acute intracranial or cervical spine injury. 2. Generalized scalp swelling without calvarial fracture. 3. Chronic cerebral infarcts. 4. Advanced bilateral temporal lobe atrophy. Electronically Signed   By: Marnee SpringJonathon  Watts M.D.   On: 09/19/2020 11:02   DG Hip Unilat W or Wo Pelvis 2-3 Views Right  Result Date: 09/19/2020 CLINICAL DATA:  Unwitnessed fall. EXAM: DG HIP (WITH OR WITHOUT PELVIS) 2-3V RIGHT COMPARISON:  None. FINDINGS: There is no evidence of hip fracture or dislocation. There is no evidence of focal bone abnormality. Moderate osteoarthritic changes of the bilateral hips and the visualized portion of the lower lumbosacral spine. IMPRESSION: 1. No acute fracture or dislocation identified about the right hip. 2. 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 Electronically Signed   By: Ted Mcalpineobrinka  Dimitrova M.D.   On: 09/19/2020 11:12    Assessment/Plan Scalp laceration, subsequent encounter Right parietal scalp laceration, ?glued or dry blood noted, it should heal.   Fall Mechanical fall, forget using walker in setting of her baseline gait abnormality contributes to her falling, close supervision/assistance needed for safety.   Alzheimer disease (HCC) Hx of Alzheimer's dementia,on Donepezil5mg  qd. MMSE 12/30 05/06/20  CKD (chronic kidney disease) stage 3, GFR 30-59 ml/min (HCC) CKD, Bun/creat201.04 09/19/20  Atrial fibrillation  (HCC) heart rate is in control, takes Metoprolol, Eliquis.  Hyperlipidemia LDL 30 02/2020, takes Atorvastatin.    Edema edema, takes Furosemide.   Anemia due to chronic kidney disease  Vit B 12 475, Ferritin 49, Iron 51 05/28/20, Hgb 12.9 09/19/20     Family/ staff Communication: plan of care reviewed with the patient and charge nurse.   Labs/tests ordered: none  Time spend 35 minutes.

## 2020-09-22 NOTE — Assessment & Plan Note (Signed)
LDL 30 02/2020, takes Atorvastatin.   

## 2020-09-22 NOTE — Assessment & Plan Note (Signed)
Right parietal scalp laceration, ?glued or dry blood noted, it should heal.

## 2020-10-01 ENCOUNTER — Encounter: Payer: Self-pay | Admitting: Internal Medicine

## 2020-10-01 ENCOUNTER — Non-Acute Institutional Stay (SKILLED_NURSING_FACILITY): Payer: Medicare Other | Admitting: Internal Medicine

## 2020-10-01 DIAGNOSIS — I63431 Cerebral infarction due to embolism of right posterior cerebral artery: Secondary | ICD-10-CM | POA: Diagnosis not present

## 2020-10-01 DIAGNOSIS — N1832 Chronic kidney disease, stage 3b: Secondary | ICD-10-CM | POA: Diagnosis not present

## 2020-10-01 DIAGNOSIS — G309 Alzheimer's disease, unspecified: Secondary | ICD-10-CM | POA: Diagnosis not present

## 2020-10-01 DIAGNOSIS — I10 Essential (primary) hypertension: Secondary | ICD-10-CM

## 2020-10-01 DIAGNOSIS — E785 Hyperlipidemia, unspecified: Secondary | ICD-10-CM

## 2020-10-01 DIAGNOSIS — I480 Paroxysmal atrial fibrillation: Secondary | ICD-10-CM | POA: Diagnosis not present

## 2020-10-01 DIAGNOSIS — F0280 Dementia in other diseases classified elsewhere without behavioral disturbance: Secondary | ICD-10-CM

## 2020-10-01 DIAGNOSIS — R609 Edema, unspecified: Secondary | ICD-10-CM

## 2020-10-01 NOTE — Progress Notes (Signed)
Location:    [facility] Room Number: 7 Place of Service:  SNF [phone]) Provider:  Einar CrowGupta, Juanelle Trueheart MD  Mahlon GammonGupta, Eline Geng L, MD  Patient Care Team: Mahlon GammonGupta, Morenike Cuff L, MD as PCP - General (Internal Medicine)  Extended Emergency Contact Information Primary Emergency Contact: Marval RegalOX,MICHAEL JOHNSON Address: [address]          [place], [place] [address] [place] Home Phone: [phone] Relation: Son  Code Status:  DNR Goals of care: Advanced Directive information Advanced Directives 10/01/2020  Does Patient Have a Medical Advance Directive? Yes  Type of Estate agentAdvance Directive Healthcare Power of [place];Living will;Out of facility DNR (pink MOST or yellow form)  Does patient want to make changes to medical advance directive? No - Patient declined  Copy of Healthcare Power of Attorney in Chart? Yes - validated most recent copy scanned in chart (See row information)  Would patient like information on creating a medical advance directive? -  Pre-existing out of facility DNR order (yellow form or pink MOST form) Yellow form placed in chart (order not valid for inpatient use);Pink MOST form placed in chart (order not valid for inpatient use)     Chief Complaint  Patient presents with  . Medical Management of Chronic Issues  . Health Maintenance    Dexa scan     HPI:  Pt is a 87 y.o. female seen today for medical management of chronic diseases.    Is Long term resident  She was admitted after she had a acute right PCA infarct. Has been on Eliquis since then. Also has a history of Alzheimer's dementia, hypertension, A. fib. Her echo had a EF of 60 to 65% with LA dilated  Stable Did have one fall few weeks ago Seems Mechanical fall CT scan showed Temporal Atrophy and Chronic Cerebral Infarcts Walks with her walker Does have Progressive dementia    Past Medical History:  Diagnosis Date  . Alzheimer disease (HCC)   . Atrial fibrillation (HCC)   .  Hypertension    History reviewed. No pertinent surgical history.  Allergies  Allergen Reactions  . Bee Venom Other (See Comments)    Listed on MAR    Allergies as of 10/01/2020      Reactions   Bee Venom Other (See Comments)   [facility]      Medication List       Accurate as of October 01, 2020  4:51 PM. If you have any questions, ask your nurse or doctor.        apixaban 5 MG Tabs tablet Commonly known as: ELIQUIS Take 5 mg by mouth 2 (two) times daily.   atorvastatin 20 MG tablet Commonly known as: LIPITOR Take 10 mg by mouth at bedtime.   bimatoprost 0.01 % Soln Commonly known as: LUMIGAN Place 1 drop into both eyes at bedtime.   CALCIUM 600 + D PO Take 1 tablet by mouth in the morning and at bedtime.   CENTRUM SILVER PO Take 1 tablet by mouth daily.   donepezil 5 MG tablet Commonly known as: ARICEPT Take 5 mg by mouth at bedtime.   furosemide 20 MG tablet Commonly known as: LASIX Take 20 mg by mouth daily.   Metoprolol Succinate 50 MG Cs24 Take 50 mg by mouth daily. Hold if Systolic is < 95 or if pulse is less than 60   zinc oxide 20 % ointment Apply 1 application topically as needed for irritation. Apply to buttocks after every  incontinent episode and as needed for redness       Review of Systems  Unable to perform ROS: Dementia    Immunization History  Administered Date(s) Administered  . Influenza-Unspecified 04/02/2014, 04/01/2016, 04/14/2017, 03/17/2020  . Moderna Sars-Covid-2 Vaccination 06/17/2019, 07/15/2019, 04/27/2020  . Pneumococcal Conjugate-13 03/24/2014  . Pneumococcal Polysaccharide-23 02/26/2009  . Tdap 03/08/2012, 12/23/2018  . Zoster 09/03/2007   Pertinent  Health Maintenance Due  Topic Date Due  . DEXA SCAN  Never done  . INFLUENZA VACCINE  01/11/2021  . PNA vac Low Risk Adult  Completed   No flowsheet data found. Functional Status Survey:    Vitals:   10/01/20 1643  BP: 124/73  Pulse: 87  Resp: 20  Temp:  (!) 96.9 F (36.1 C)  SpO2: 98%  Weight: 175 lb (79.4 kg)  Height: 5\' 1"  (1.549 m)   Body mass index is 33.07 kg/m. Physical Exam Constitutional: . Well-developed and well-nourished.  HENT:  Head: Normocephalic.  Mouth/Throat: Oropharynx is clear and moist.  Eyes: Pupils are equal, round, and reactive to light.  Neck: Neck supple.  Cardiovascular: Normal rate and normal heart sounds.  No murmur heard. Pulmonary/Chest: Effort normal and breath sounds normal. No respiratory distress. No wheezes. She has no rales.  Abdominal: Soft. Bowel sounds are normal. No distension. There is no tenderness. There is no rebound.  Musculoskeletal: Mild edema  Lymphadenopathy: none Neurological: Walks with her walker Answers Appropriately but gets confused Skin: Skin is warm and dry.  Psychiatric: Normal mood and affect. Behavior is normal. Thought content normal.   Labs reviewed: Recent Labs    11/12/19 2259 11/14/19 0350 11/25/19 0000 05/28/20 0000 09/07/20 0000 09/19/20 1003  NA 140 136   < > 140 139 135  K 4.4 4.3   < > 4.3 4.4 4.1  CL 101 102   < > 103 101 99  CO2 27 27   < > 28* 27* 29  GLUCOSE 104* 132*  --   --   --  139*  BUN 27* 19   < > 26* 24* 20  CREATININE 1.18* 1.15*   < > 1.2* 1.1 1.04*  CALCIUM 9.3 8.5*   < > 9.0 9.5 9.3   < > = values in this interval not displayed.   Recent Labs    11/25/19 0000 02/27/20 0000 09/07/20 0000  AST 18 12* 17  ALT 16 9 13   ALKPHOS 95 81 98  ALBUMIN  --  3.2* 3.9   Recent Labs    11/12/19 2259 11/25/19 0000 02/27/20 0000 05/28/20 0000 09/07/20 0000 09/19/20 1003  WBC 7.7   < > 6.5 5.9 9.6 12.2*  NEUTROABS  --   --  4,193  --  6,134.00  --   HGB 11.6*   < > 10.1* 9.4* 12.3 12.9  HCT 35.5*   < > 31* 29* 38 39.6  MCV 92.9  --   --   --   --  92.3  PLT 239   < > 243 229 264 286   < > = values in this interval not displayed.   Lab Results  Component Value Date   TSH 1.75 11/25/2019   Lab Results  Component Value Date    HGBA1C 5.3 11/13/2019   Lab Results  Component Value Date   CHOL 89 02/27/2020   HDL 46 02/27/2020   LDLCALC 30 02/27/2020   TRIG 51 02/27/2020   CHOLHDL 2.2 11/13/2019    Significant Diagnostic Results in last  30 days:  CT Head Wo Contrast  Result Date: 09/19/2020 CLINICAL DATA:  Minor head trauma. Level 2 trauma on blood thinners. EXAM: CT HEAD WITHOUT CONTRAST CT CERVICAL SPINE WITHOUT CONTRAST TECHNIQUE: Multidetector CT imaging of the head and cervical spine was performed following the standard protocol without intravenous contrast. Multiplanar CT image reconstructions of the cervical spine were also generated. COMPARISON:  11/13/2019 FINDINGS: CT HEAD FINDINGS Brain: Small focus of nodular high-density left parafalcine is unchanged from 2021 and nontraumatic. Remote right occipital infarct. Biparietal encephalomalacia and cortical thinning. Brain atrophy heavily affecting the medial temporal lobes. Vascular: No hyperdense vessel or unexpected calcification. Skull: Generalized scalp swelling. Sinuses/Orbits: Bilateral cataract resection. Chronic right sphenoid sinusitis with patent ostium. CT CERVICAL SPINE FINDINGS Alignment: No traumatic malalignment Skull base and vertebrae: No acute fracture. No primary bone lesion or focal pathologic process. Soft tissues and spinal canal: No prevertebral fluid or swelling. No visible canal hematoma. Disc levels: Generalized degenerative disc narrowing and ridging. Milder facet osteoarthritis. Upper chest: Negative IMPRESSION: 1. No evidence of acute intracranial or cervical spine injury. 2. Generalized scalp swelling without calvarial fracture. 3. Chronic cerebral infarcts. 4. Advanced bilateral temporal lobe atrophy. Electronically Signed   By: Marnee Spring M.D.   On: 09/19/2020 11:02   CT Cervical Spine Wo Contrast  Result Date: 09/19/2020 CLINICAL DATA:  Minor head trauma. Level 2 trauma on blood thinners. EXAM: CT HEAD WITHOUT CONTRAST CT  CERVICAL SPINE WITHOUT CONTRAST TECHNIQUE: Multidetector CT imaging of the head and cervical spine was performed following the standard protocol without intravenous contrast. Multiplanar CT image reconstructions of the cervical spine were also generated. COMPARISON:  11/13/2019 FINDINGS: CT HEAD FINDINGS Brain: Small focus of nodular high-density left parafalcine is unchanged from 2021 and nontraumatic. Remote right occipital infarct. Biparietal encephalomalacia and cortical thinning. Brain atrophy heavily affecting the medial temporal lobes. Vascular: No hyperdense vessel or unexpected calcification. Skull: Generalized scalp swelling. Sinuses/Orbits: Bilateral cataract resection. Chronic right sphenoid sinusitis with patent ostium. CT CERVICAL SPINE FINDINGS Alignment: No traumatic malalignment Skull base and vertebrae: No acute fracture. No primary bone lesion or focal pathologic process. Soft tissues and spinal canal: No prevertebral fluid or swelling. No visible canal hematoma. Disc levels: Generalized degenerative disc narrowing and ridging. Milder facet osteoarthritis. Upper chest: Negative IMPRESSION: 1. No evidence of acute intracranial or cervical spine injury. 2. Generalized scalp swelling without calvarial fracture. 3. Chronic cerebral infarcts. 4. Advanced bilateral temporal lobe atrophy. Electronically Signed   By: Marnee Spring M.D.   On: 09/19/2020 11:02   DG Hip Unilat W or Wo Pelvis 2-3 Views Right  Result Date: 09/19/2020 CLINICAL DATA:  Unwitnessed fall. EXAM: DG HIP (WITH OR WITHOUT PELVIS) 2-3V RIGHT COMPARISON:  None. FINDINGS: There is no evidence of hip fracture or dislocation. There is no evidence of focal bone abnormality. Moderate osteoarthritic changes of the bilateral hips and the visualized portion of the lower lumbosacral spine. IMPRESSION: 1. No acute fracture or dislocation identified about the right hip. 2. 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 Electronically  Signed   By: Ted Mcalpine M.D.   On: 09/19/2020 11:12    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) On Eliquis and Statin BP controlled Alzheimer disease (HCC) Low dose of Aricept and supportive care MMSE 12/30 Stage /3b chronic kidney disease (HCC) Creat stable Paroxysmal atrial fibrillation (HCC) On Eliquis and TOprol Edema, unspecified type Low dose of lasix Hyperlipidemia, unspecified hyperlipidemia type Continue Statin Will decrease  the Lipitor to 5 mg as LDL was 30 in 9/21 Essential hypertension On Toprol  Family/ staff Communication:   Labs/tests ordered:

## 2020-10-16 ENCOUNTER — Other Ambulatory Visit: Payer: Self-pay | Admitting: Orthopedic Surgery

## 2020-10-16 DIAGNOSIS — E785 Hyperlipidemia, unspecified: Secondary | ICD-10-CM

## 2020-10-16 MED ORDER — ATORVASTATIN CALCIUM 20 MG PO TABS: 20.0000 mg | ORAL_TABLET | ORAL | 3 refills | Status: DC

## 2020-10-16 MED ORDER — ATORVASTATIN CALCIUM 10 MG PO TABS: 10.0000 mg | ORAL_TABLET | ORAL | 3 refills | Status: DC

## 2020-10-16 MED ORDER — ATORVASTATIN CALCIUM 10 MG PO TABS
10.0000 mg | ORAL_TABLET | ORAL | 3 refills | Status: DC
Start: 2020-10-16 — End: 2020-10-26

## 2020-10-16 NOTE — Progress Notes (Signed)
Discussed benefits and risks of continuing statin. At this time, daughter would like medication discontinued, but agrees to have it given twice weekly. I have discussed concerns regarding cardiac rebound effects. She would like to discuss statin therapy with friends who are cardiologists and will contact [facility] soon.

## 2020-10-26 ENCOUNTER — Encounter: Payer: Self-pay | Admitting: Orthopedic Surgery

## 2020-10-26 ENCOUNTER — Non-Acute Institutional Stay (SKILLED_NURSING_FACILITY): Payer: Medicare Other | Admitting: Orthopedic Surgery

## 2020-10-26 DIAGNOSIS — I10 Essential (primary) hypertension: Secondary | ICD-10-CM | POA: Diagnosis not present

## 2020-10-26 DIAGNOSIS — N1832 Chronic kidney disease, stage 3b: Secondary | ICD-10-CM | POA: Diagnosis not present

## 2020-10-26 DIAGNOSIS — E785 Hyperlipidemia, unspecified: Secondary | ICD-10-CM

## 2020-10-26 DIAGNOSIS — F0280 Dementia in other diseases classified elsewhere without behavioral disturbance: Secondary | ICD-10-CM

## 2020-10-26 DIAGNOSIS — R600 Localized edema: Secondary | ICD-10-CM

## 2020-10-26 DIAGNOSIS — D631 Anemia in chronic kidney disease: Secondary | ICD-10-CM

## 2020-10-26 DIAGNOSIS — I480 Paroxysmal atrial fibrillation: Secondary | ICD-10-CM | POA: Diagnosis not present

## 2020-10-26 DIAGNOSIS — H6123 Impacted cerumen, bilateral: Secondary | ICD-10-CM

## 2020-10-26 DIAGNOSIS — G309 Alzheimer's disease, unspecified: Secondary | ICD-10-CM

## 2020-10-26 DIAGNOSIS — I63431 Cerebral infarction due to embolism of right posterior cerebral artery: Secondary | ICD-10-CM

## 2020-10-26 NOTE — Progress Notes (Signed)
Location:   [facility] Room Number: N07 Place of Service:  SNF [id number](31) Provider:  Hazle NordmannAmy Trek Kimball, NP    Patient Care Team: Mahlon GammonGupta, Anjali L, MD as PCP - General (Internal Medicine)  Extended Emergency Contact Information Primary Emergency Contact: Marval RegalOX,MICHAEL JOHNSON Address: [address]          [place], [place] [address] [place] Home Phone: [phone] Relation: Son  Code Status:  DNR Goals of care: Advanced Directive information Advanced Directives 10/26/2020  Does Patient Have a Medical Advance Directive? Yes  Type of Estate agentAdvance Directive Healthcare Power of [place];Living will;Out of facility DNR (pink MOST or yellow form)  Does patient want to make changes to medical advance directive? No - Patient declined  Copy of Healthcare Power of Attorney in Chart? Yes - validated most recent copy scanned in chart (See row information)  Would patient like information on creating a medical advance directive? -  Pre-existing out of facility DNR order (yellow form or pink MOST form) Yellow form placed in chart (order not valid for inpatient use);Pink MOST form placed in chart (order not valid for inpatient use)     Chief Complaint  Patient presents with  . Medical Management of Chronic Issues    Routine follow up.  Marland Kitchen. Health Maintenance    Discuss need for DEXA scan and urine microalbumin    HPI:  Pt is a 87 y.o. female seen today for medical management of chronic diseases.    [facility] Past medical history includes: hypertension, atrial fibrillation, Alzheimer's, CKD stage 3, hyperlipidemia and anemia related to CKD.   Frequent falls- she continues to ambulate with walker. Last reported fall 04/15.   Alzheimer's- no recent behavioral outbursts. She is very pleasant to speak with, flat affect. Aricept Qhs.   Hypertension- takes metoprolol daily. Recent blood pressures:  05/16-  138/88  05/15- 138/70  05/14- 132/76  Atrial fibrillation- rate controlled with metoprolol daily. Remains on Eliquis 5 mg bid.   Hyperlipidemia- remains on lipitor 10 mg every Monday and Friday. No complains of muscle aches.   CKD- remains on lasix 20 mg daily. Staff continues to encourage hydration.   Eating about 75-100% of meals. Recent weights are as follows:  05/03- 180 lbs  04/01- 175 lbs  03/01- 175 lbs  02/01- 180 lbs  Nursing does not report any other concerns, vitals stable.        Past Medical History:  Diagnosis Date  . Alzheimer disease (HCC)   . Atrial fibrillation (HCC)   . Hypertension    History reviewed. No pertinent surgical history.  Allergies  Allergen Reactions  . Bee Venom Other (See Comments)    Listed on MAR    Allergies as of 10/26/2020      Reactions   Bee Venom Other (See Comments)   [facility]      Medication List       Accurate as of Oct 26, 2020  9:45 AM. If you have any questions, ask your nurse or doctor.        apixaban 5 MG Tabs tablet Commonly known as: ELIQUIS Take 5 mg by mouth 2 (two) times daily.   atorvastatin 10 MG tablet Commonly known as: LIPITOR Take 10 mg by mouth as directed. Every Monday and Friday What changed: Another medication with the same name was removed. Continue taking this medication, and follow the directions you see here.  Changed by: Octavia Heir, NP   bimatoprost 0.01 % Soln Commonly known as: LUMIGAN Place 1 drop into both eyes at bedtime.   CALCIUM 600 + D PO Take 1 tablet by mouth in the morning and at bedtime.   CENTRUM SILVER PO Take 1 tablet by mouth daily.   donepezil 5 MG tablet Commonly known as: ARICEPT Take 5 mg by mouth at bedtime.   furosemide 20 MG tablet Commonly known as: LASIX Take 20 mg by mouth daily.   Metoprolol Succinate 50 MG Cs24 Take 50 mg by mouth daily. Hold if Systolic is < 95 or if pulse is less than 60   zinc oxide 20 % ointment Apply 1  application topically as needed for irritation. Apply to buttocks after every incontinent episode and as needed for redness       Review of Systems  Unable to perform ROS: Dementia    Immunization History  Administered Date(s) Administered  . Influenza-Unspecified 04/02/2014, 04/01/2016, 04/14/2017, 03/17/2020  . Moderna Sars-Covid-2 Vaccination 06/17/2019, 07/15/2019, 04/27/2020  . Pneumococcal Conjugate-13 03/24/2014  . Pneumococcal Polysaccharide-23 02/26/2009  . Tdap 03/08/2012, 12/23/2018  . Zoster 09/03/2007   Pertinent  Health Maintenance Due  Topic Date Due  . URINE MICROALBUMIN  Never done  . DEXA SCAN  Never done  . INFLUENZA VACCINE  01/11/2021  . PNA vac Low Risk Adult  Completed   No flowsheet data found. Functional Status Survey:    Vitals:   10/26/20 0935  BP: 138/70  Pulse: 87  Resp: (!) 58  Temp: (!) 96.8 F (36 C)  SpO2: 98%  Weight: 180 lb (81.6 kg)  Height: 5\' 1"  (1.549 m)   Body mass index is 34.01 kg/m. Physical Exam Vitals reviewed.  Constitutional:      General: She is not in acute distress. HENT:     Head: Normocephalic.     Right Ear: There is impacted cerumen.     Left Ear: There is impacted cerumen.     Nose: Nose normal.     Mouth/Throat:     Mouth: Mucous membranes are moist.  Eyes:     General:        Right eye: No discharge.        Left eye: No discharge.     Pupils: Pupils are equal, round, and reactive to light.  Neck:     Vascular: No carotid bruit.  Cardiovascular:     Rate and Rhythm: Normal rate. Rhythm irregular.     Pulses: Normal pulses.     Heart sounds: Normal heart sounds. No murmur heard.   Pulmonary:     Effort: Pulmonary effort is normal. No respiratory distress.     Breath sounds: Normal breath sounds. No wheezing.  Abdominal:     General: Bowel sounds are normal. There is no distension.     Palpations: Abdomen is soft.     Tenderness: There is no abdominal tenderness.  Musculoskeletal:      Cervical back: Normal range of motion.     Right lower leg: Edema present.     Left lower leg: Edema present.     Comments: Non-pitting  Lymphadenopathy:     Cervical: No cervical adenopathy.  Skin:    General: Skin is warm and dry.     Capillary Refill: Capillary refill takes less than 2 seconds.  Neurological:     General: No focal deficit present.     Mental Status: She is alert. Mental status is at baseline.  Motor: Weakness present.     Gait: Gait abnormal.     Comments: walker  Psychiatric:        Mood and Affect: Mood normal. Affect is flat.        Speech: Speech normal.        Behavior: Behavior normal. Behavior is cooperative.        Cognition and Memory: Memory is impaired.     Labs reviewed: Recent Labs    11/12/19 2259 11/14/19 0350 11/25/19 0000 05/28/20 0000 09/07/20 0000 09/19/20 1003  NA 140 136   < > 140 139 135  K 4.4 4.3   < > 4.3 4.4 4.1  CL 101 102   < > 103 101 99  CO2 27 27   < > 28* 27* 29  GLUCOSE 104* 132*  --   --   --  139*  BUN 27* 19   < > 26* 24* 20  CREATININE 1.18* 1.15*   < > 1.2* 1.1 1.04*  CALCIUM 9.3 8.5*   < > 9.0 9.5 9.3   < > = values in this interval not displayed.   Recent Labs    11/25/19 0000 02/27/20 0000 09/07/20 0000  AST 18 12* 17  ALT 16 9 13   ALKPHOS 95 81 98  ALBUMIN  --  3.2* 3.9   Recent Labs    11/12/19 2259 11/25/19 0000 02/27/20 0000 05/28/20 0000 09/07/20 0000 09/19/20 1003  WBC 7.7   < > 6.5 5.9 9.6 12.2*  NEUTROABS  --   --  4,193  --  6,134.00  --   HGB 11.6*   < > 10.1* 9.4* 12.3 12.9  HCT 35.5*   < > 31* 29* 38 39.6  MCV 92.9  --   --   --   --  92.3  PLT 239   < > 243 229 264 286   < > = values in this interval not displayed.   Lab Results  Component Value Date   TSH 1.75 11/25/2019   Lab Results  Component Value Date   HGBA1C 5.3 11/13/2019   Lab Results  Component Value Date   CHOL 89 02/27/2020   HDL 46 02/27/2020   LDLCALC 30 02/27/2020   TRIG 51 02/27/2020   CHOLHDL  2.2 11/13/2019    Significant Diagnostic Results in last 30 days:  No results found.  Assessment/Plan 1. Bilateral impacted cerumen - cannot visualize TM - debrox 5 gtts Qhs x 5 days, then flush ears with warm water on day 6   2. Paroxysmal atrial fibrillation (HCC) - rate controlled on metoprolol - cont Eliquis for clot prevention  3. Essential hypertension - controlled with metoprolol - cont low sodium diet  4. Stage 3b chronic kidney disease (HCC) - creat 1.04- 09/19/2020 - continue to avoid nephrotoxic drugs like NSAIDS and dose adjust medications to be renally excreted - encourage hydration  5. Cerebrovascular accident (CVA) due to embolism of right posterior cerebral artery (HCC) - stable with Eliquis and statin - bp controlled  6. Hyperlipidemia, unspecified hyperlipidemia type - stable with statin - cont low fat diet and avoid fried foods  7. Alzheimer disease (HCC) - last MMSE 12/30 - pleasant, no behavioral outbursts - continues to ambulate with walker and feed self - cont aricept qhs - cont skilled nursing   8. Bilateral leg edema - stable with lasix 20 mg daily  9. Anemia due to stage 3b chronic kidney disease (HCC) - hgb 12.9 09/19/2020  Family/ staff Communication: plan discussed with nurse  Labs/tests ordered:  none

## 2020-11-17 ENCOUNTER — Encounter: Payer: Self-pay | Admitting: Nurse Practitioner

## 2020-11-17 ENCOUNTER — Non-Acute Institutional Stay (SKILLED_NURSING_FACILITY): Payer: Medicare Other | Admitting: Nurse Practitioner

## 2020-11-17 DIAGNOSIS — N183 Chronic kidney disease, stage 3 unspecified: Secondary | ICD-10-CM | POA: Diagnosis not present

## 2020-11-17 DIAGNOSIS — R609 Edema, unspecified: Secondary | ICD-10-CM | POA: Diagnosis not present

## 2020-11-17 DIAGNOSIS — G309 Alzheimer's disease, unspecified: Secondary | ICD-10-CM

## 2020-11-17 DIAGNOSIS — I480 Paroxysmal atrial fibrillation: Secondary | ICD-10-CM

## 2020-11-17 DIAGNOSIS — E782 Mixed hyperlipidemia: Secondary | ICD-10-CM | POA: Diagnosis not present

## 2020-11-17 DIAGNOSIS — F0280 Dementia in other diseases classified elsewhere without behavioral disturbance: Secondary | ICD-10-CM

## 2020-11-17 DIAGNOSIS — I63431 Cerebral infarction due to embolism of right posterior cerebral artery: Secondary | ICD-10-CM

## 2020-11-17 DIAGNOSIS — N1832 Chronic kidney disease, stage 3b: Secondary | ICD-10-CM

## 2020-11-17 DIAGNOSIS — D631 Anemia in chronic kidney disease: Secondary | ICD-10-CM

## 2020-11-17 NOTE — Assessment & Plan Note (Signed)
LDL 30 02/2020, takes Atorvastatin.

## 2020-11-17 NOTE — Assessment & Plan Note (Signed)
on Donepezil5mg  qd. MMSE 12/30 05/06/20, no behavior issue.

## 2020-11-17 NOTE — Assessment & Plan Note (Signed)
Vit 12 475, Ferritin 49, Iron 51 05/28/20, Hgb 12.9 09/19/20

## 2020-11-17 NOTE — Assessment & Plan Note (Signed)
BLE edema, takes Furosemide.

## 2020-11-17 NOTE — Assessment & Plan Note (Addendum)
R posterior cerebral artery, on Statin, 11/2019 left side weakness.

## 2020-11-17 NOTE — Assessment & Plan Note (Signed)
heart rate is in control, takes Metoprolol, Eliquis.  °

## 2020-11-17 NOTE — Progress Notes (Signed)
Location:   [facility] Room Number: N07 Place of Service:  SNF (31) Provider:  Laszlo Ellerby Johnney Ou, NP    Patient Care Team: Mahlon Gammon, MD as PCP - General (Internal Medicine)  Extended Emergency Contact Information Primary Emergency Contact: Marval Regal Address: [address]          [place], [place] [address] [place] Home Phone: [phone] Relation: Son  Code Status:  DNR Goals of care: Advanced Directive information Advanced Directives 11/17/2020  Does Patient Have a Medical Advance Directive? Yes  Type of Estate agent of [place];Living will;Out of facility DNR (pink MOST or yellow form)  Does patient want to make changes to medical advance directive? No - Patient declined  Copy of Healthcare Power of Attorney in Chart? Yes - validated most recent copy scanned in chart (See row information)  Would patient like information on creating a medical advance directive? -  Pre-existing out of facility DNR order (yellow form or pink MOST form) Yellow form placed in chart (order not valid for inpatient use);Pink MOST form placed in chart (order not valid for inpatient use)     Chief Complaint  Patient presents with  . Medical Management of Chronic Issues    Routine follow up.   Marland Kitchen Health Maintenance    Discuss need for shingles vaccine and PNA vaccine.     HPI:  Pt is a 87 y.o. female seen today for medical management of chronic diseases.    Hx of Alzheimer's dementia,on Donepezil5mg  qd. MMSE 12/30 05/06/20 CKD, Bun/creat201.04 09/19/20 Afib, heart rate is in control, takes Metoprolol, Eliquis. Hyperlipidemia, LDL 30 02/2020, takes Atorvastatin.  BLE edema, takes Furosemide.  Anemia, Vit 12 475, Ferritin 49, Iron 51 05/28/20, Hgb 12.9 09/19/20  CVA R posterior cerebral artery, on Statin, 11/2019 left side weakness.    Past Medical History:  Diagnosis Date   . Alzheimer disease (HCC)   . Atrial fibrillation (HCC)   . Hypertension    History reviewed. No pertinent surgical history.  Allergies  Allergen Reactions  . Bee Venom Other (See Comments)    Listed on MAR    Allergies as of 11/17/2020      Reactions   Bee Venom Other (See Comments)   Listed on [facility]      Medication List       Accurate as of November 17, 2020 12:22 PM. If you have any questions, ask your nurse or doctor.        apixaban 5 MG Tabs tablet Commonly known as: ELIQUIS Take 5 mg by mouth 2 (two) times daily.   atorvastatin 10 MG tablet Commonly known as: LIPITOR Take 10 mg by mouth as directed. Every Monday and Friday   bimatoprost 0.01 % Soln Commonly known as: LUMIGAN Place 1 drop into both eyes at bedtime.   CALCIUM 600 + D PO Take 1 tablet by mouth in the morning and at bedtime.   CENTRUM SILVER PO Take 1 tablet by mouth daily.   donepezil 5 MG tablet Commonly known as: ARICEPT Take 5 mg by mouth at bedtime.   furosemide 20 MG tablet Commonly known as: LASIX Take 20 mg by mouth daily.   Metoprolol Succinate 50 MG Cs24 Take 50 mg by mouth daily. Hold if Systolic is < 95 or if pulse is less than 60   zinc oxide 20 % ointment Apply 1 application topically as needed for irritation. Apply to buttocks after every incontinent episode and  as needed for redness       Review of Systems  Constitutional: Negative for appetite change, fatigue and fever.  HENT: Positive for hearing loss. Negative for congestion and trouble swallowing.   Respiratory: Negative for shortness of breath.   Cardiovascular: Positive for leg swelling.  Gastrointestinal: Negative for abdominal pain and constipation.  Genitourinary: Negative for dysuria.  Musculoskeletal: Positive for gait problem.  Skin: Positive for wound. Negative for color change.  Neurological: Positive for speech difficulty and weakness. Negative for dizziness and headaches.       Dementia. Residual left  sided weakness.   Psychiatric/Behavioral: Negative for behavioral problems and sleep disturbance. The patient is not nervous/anxious.     Immunization History  Administered Date(s) Administered  . Influenza-Unspecified 04/02/2014, 04/01/2016, 04/14/2017, 03/17/2020  . Moderna Sars-Covid-2 Vaccination 06/17/2019, 07/15/2019, 04/27/2020  . Pneumococcal Conjugate-13 03/24/2014  . Pneumococcal Polysaccharide-23 02/26/2009  . Tdap 03/08/2012, 12/23/2018  . Zoster, Live 09/03/2007   Pertinent  Health Maintenance Due  Topic Date Due  . URINE MICROALBUMIN  11/26/2020 (Originally 07/19/1937)  . DEXA SCAN  10/26/2021 (Originally 07/18/1994)  . INFLUENZA VACCINE  01/11/2021  . PNA vac Low Risk Adult  Completed   No flowsheet data found. Functional Status Survey:    Vitals:   11/17/20 1022  BP: 134/60  Pulse: 68  Resp: 20  Temp: (!) 97 F (36.1 C)  SpO2: 97%  Weight: 179 lb 12.8 oz (81.6 kg)  Height: 5\' 1"  (1.549 m)   Body mass index is 33.97 kg/m. Physical Exam Vitals and nursing note reviewed.  Constitutional:      Appearance: Normal appearance.  HENT:     Head: Normocephalic and atraumatic.  Eyes:     Extraocular Movements: Extraocular movements intact.     Conjunctiva/sclera: Conjunctivae normal.     Pupils: Pupils are equal, round, and reactive to light.  Cardiovascular:     Rate and Rhythm: Normal rate. Rhythm irregular.     Heart sounds: No murmur heard.   Pulmonary:     Effort: Pulmonary effort is normal.     Breath sounds: No rhonchi or rales.  Abdominal:     General: Bowel sounds are normal.     Palpations: Abdomen is soft.     Tenderness: There is no abdominal tenderness.  Musculoskeletal:     Cervical back: Normal range of motion and neck supple.     Right lower leg: Edema present.     Left lower leg: Edema present.     Comments: Trace  edema BLE  Skin:    General: Skin is warm and dry.  Neurological:     Mental Status: She is alert. Mental status is at  baseline.     Motor: Weakness present.     Gait: Gait abnormal.     Comments: Oriented to person and place.  Left sided weakness. Ambulates with walker.   Psychiatric:        Mood and Affect: Mood normal.        Behavior: Behavior normal.     Labs reviewed: Recent Labs    05/28/20 0000 09/07/20 0000 09/19/20 1003  NA 140 139 135  K 4.3 4.4 4.1  CL 103 101 99  CO2 28* 27* 29  GLUCOSE  --   --  139*  BUN 26* 24* 20  CREATININE 1.2* 1.1 1.04*  CALCIUM 9.0 9.5 9.3   Recent Labs    11/25/19 0000 02/27/20 0000 09/07/20 0000  AST 18 12* 17  ALT  16 9 13   ALKPHOS 95 81 98  ALBUMIN  --  3.2* 3.9   Recent Labs    02/27/20 0000 05/28/20 0000 09/07/20 0000 09/19/20 1003  WBC 6.5 5.9 9.6 12.2*  NEUTROABS 4,193  --  6,134.00  --   HGB 10.1* 9.4* 12.3 12.9  HCT 31* 29* 38 39.6  MCV  --   --   --  92.3  PLT 243 229 264 286   Lab Results  Component Value Date   TSH 1.75 11/25/2019   Lab Results  Component Value Date   HGBA1C 5.3 11/13/2019   Lab Results  Component Value Date   CHOL 89 02/27/2020   HDL 46 02/27/2020   LDLCALC 30 02/27/2020   TRIG 51 02/27/2020   CHOLHDL 2.2 11/13/2019    Significant Diagnostic Results in last 30 days:  No results found.  Assessment/Plan Atrial fibrillation (HCC) heart rate is in control, takes Metoprolol, Eliquis.   Hyperlipidemia LDL 30 02/2020, takes Atorvastatin.    Edema BLE edema, takes Furosemide.    Anemia due to chronic kidney disease Vit 12 475, Ferritin 49, Iron 51 05/28/20, Hgb 12.9 09/19/20   Stroke [facility]) R posterior cerebral artery, on Statin, 11/2019 left side weakness.    CKD (chronic kidney disease) stage 3, GFR 30-59 ml/min (HCC) Bun/creat201.04 09/19/20   Alzheimer disease (HCC) on Donepezil5mg  qd. MMSE 12/30 05/06/20, no behavior issue.       Family/ staff Communication: plan of care reviewed with the patient and charge nurse.   Labs/tests ordered:  none  Time spend 35 minutes.

## 2020-11-17 NOTE — Assessment & Plan Note (Signed)
Bun/creat201.04 09/19/20

## 2020-12-04 IMAGING — CT CT MAXILLOFACIAL WITHOUT CONTRAST
3 of 7 series · 13 of 47 positions shown, 15 images · non-contrast
Comparison: Head CT 03/08/2012, 04/30/2014.
COMPARISON: Head CT 03/08/2012, 04/30/2014.

Addendum:
CLINICAL DATA: 89-year-old female status post unwitnessed fall.
Laceration hematoma to left eyebrow.

EXAM:
CT HEAD WITHOUT CONTRAST
CT MAXILLOFACIAL WITHOUT CONTRAST
TECHNIQUE: Multidetector CT imaging of the head and maxillofacial structures
were performed using the standard protocol without intravenous
contrast. Multiplanar CT image reconstructions of the maxillofacial
structures were also generated.

[Series 4: head bone · axial · 0.43mm/px · z∈[-93,+47]mm · 8 of 84 slices shown, 10 images]
[im 7/84  brain]
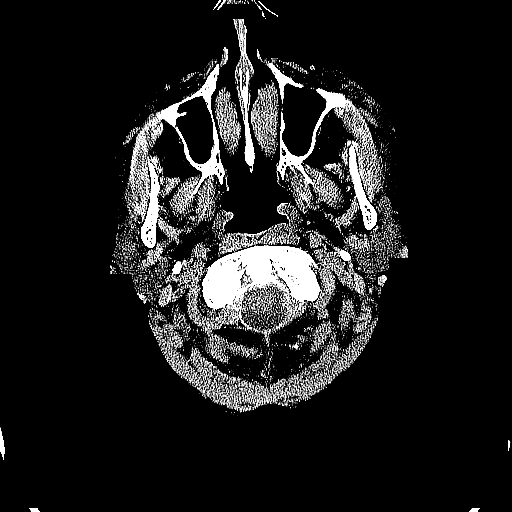
[im 7/84  bone]
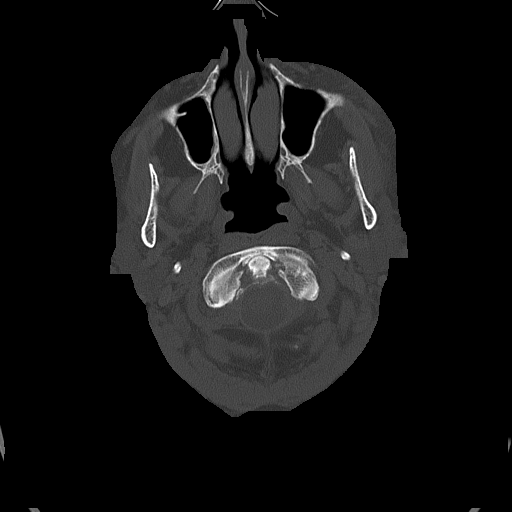
[im 21/84  bone]
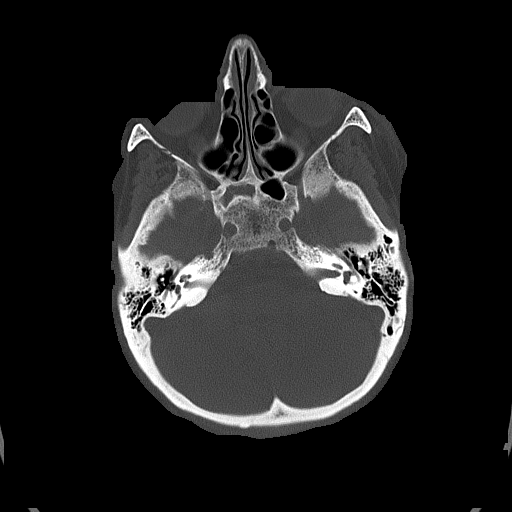
[im 28/84  bone]
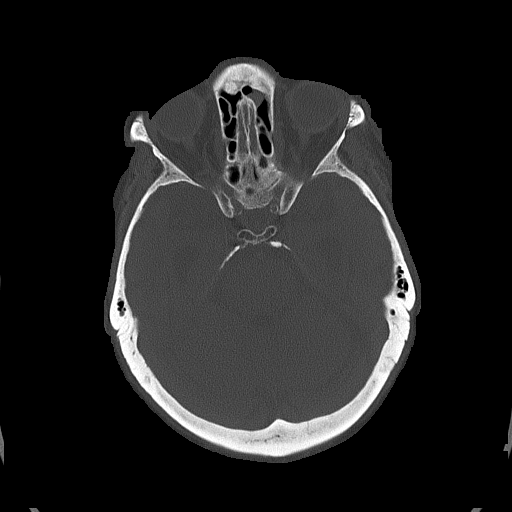
[im 35/84  bone]
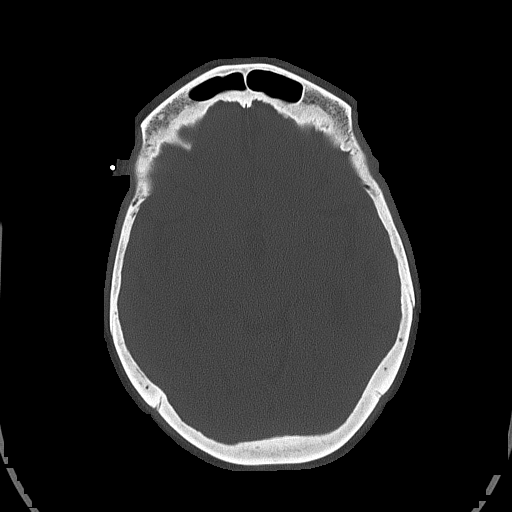
[im 49/84  brain]
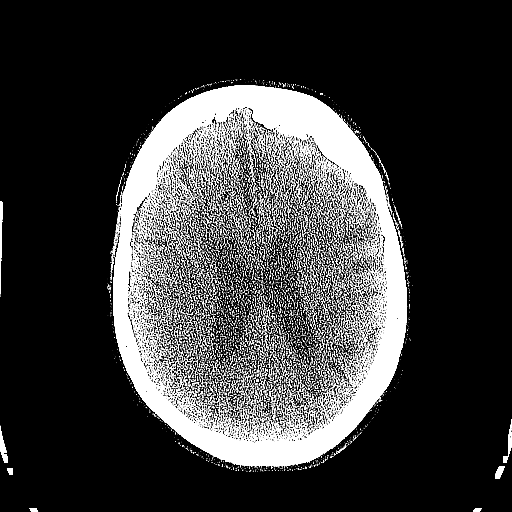
[im 49/84  bone]
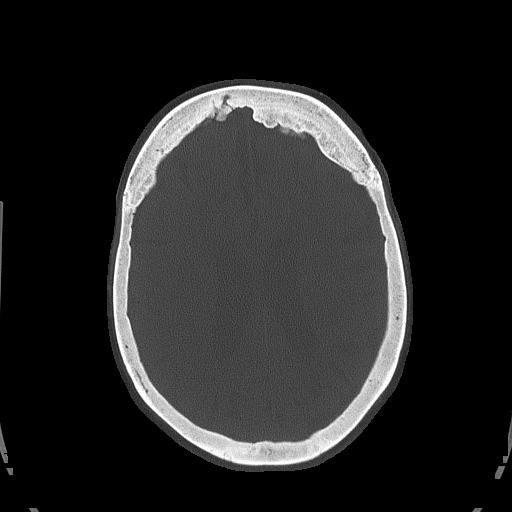
[im 56/84  bone]
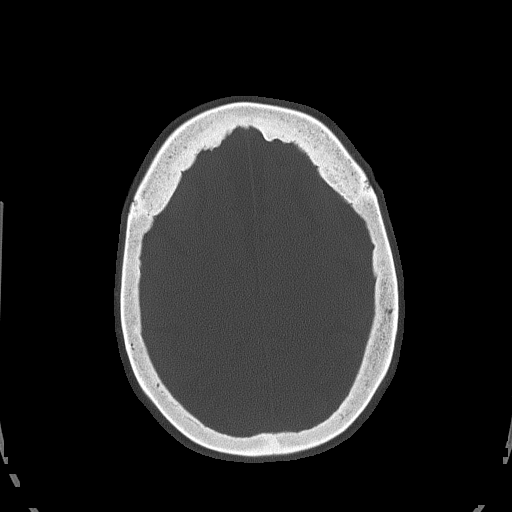
[im 63/84  bone]
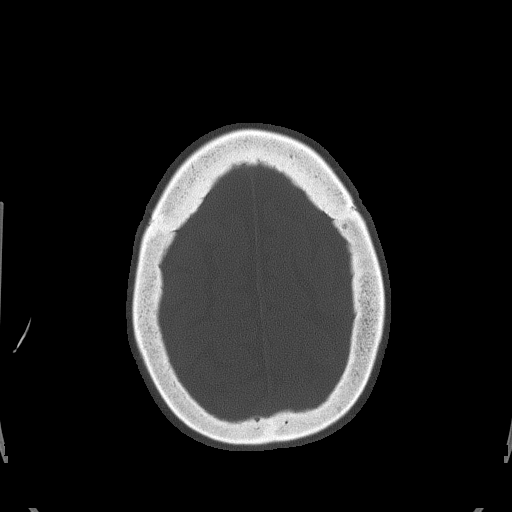
[im 77/84  bone]
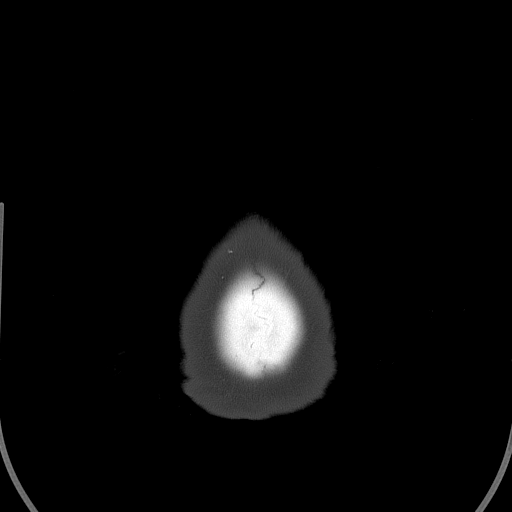

[Series 14: facialbone 2.0 sag st · sagittal · 0.33mm/px · 2 of 80 slices shown]
[im 27/80  bone]
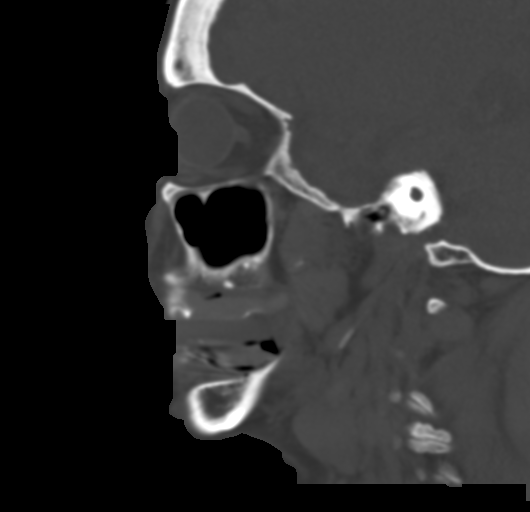
[im 53/80  bone]
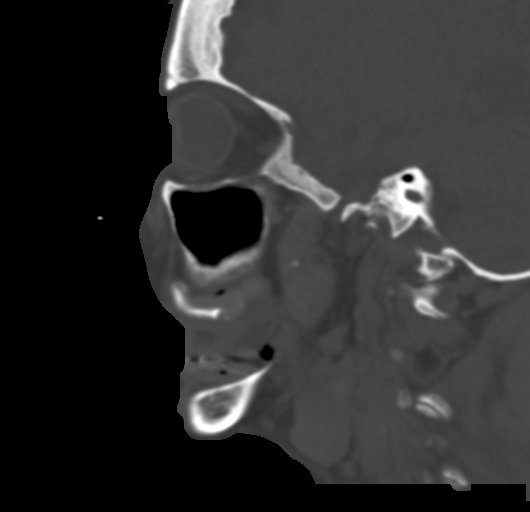

[Series 15: facialbone 2.0 cor st · coronal · 0.33mm/px · 3 of 93 slices shown]
[im 19/93  bone]
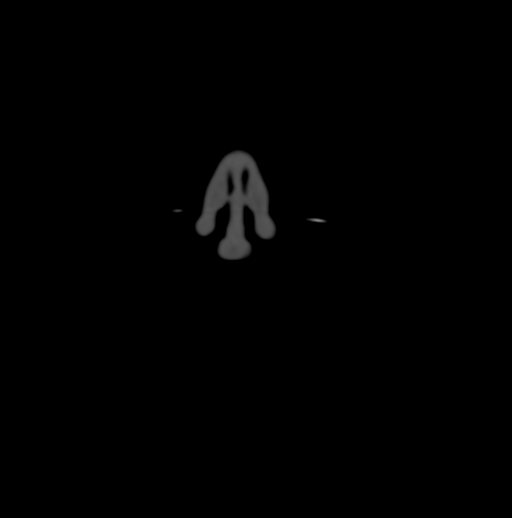
[im 37/93  bone]
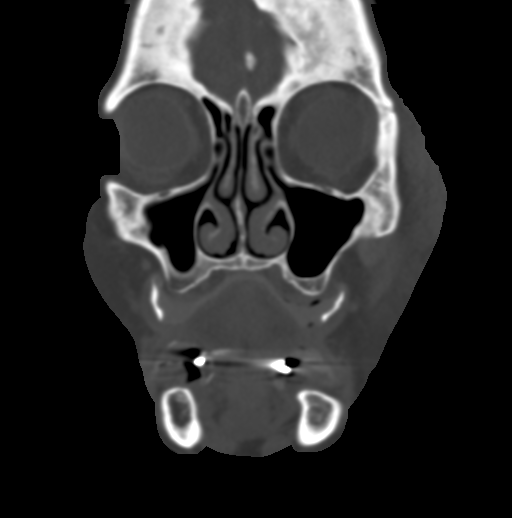
[im 56/93  bone]
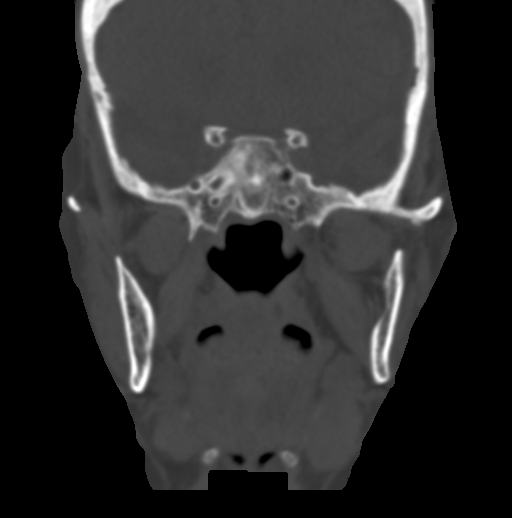

[13 of 47 positions shown; findings below may reference images not displayed]

FINDINGS: CT HEAD FINDINGS

Brain: Positive for trace para falcine subdural hematoma (coronal
image 43 series 5. No other subdural blood identified. No
intracranial mass effect. No midline shift.

No other acute intracranial hemorrhage identified. Patchy bilateral
white matter hypodensity has not significantly changed. There is
biparietal disproportionate cerebral volume loss which also appears
stable. Overall cerebral volume has not significantly changed since
2628. No ventriculomegaly. No cortically based acute infarct
identified.

Vascular: Calcified atherosclerosis at the skull base. No suspicious
intracranial vascular hyperdensity.

Skull: Calvarium appears stable and intact with hyperostosis.

Other: Mild left forehead contusion. See face soft tissue findings
below. Other scalp soft tissues are within normal limits.

CT MAXILLOFACIAL FINDINGS

Osseous: Absent dentition. TMJ degeneration. Mandible intact. No
maxilla or zygoma fracture identified. No nasal bone fracture.
Central skull base appears intact.

Visible cervical vertebrae appear grossly intact with advanced
degenerative changes. Degenerative cervical spinal stenosis, the
C5-6 level appears most stenotic.

Orbits: No orbital wall fracture. Globes appear symmetric and
intact. No intraorbital soft tissue abnormality.

Superficial left periorbital, premalar and lateral face superficial
hematoma with trace soft tissue gas (series 8, image 55).

Sinuses: Well pneumatized. Occasional paranasal sinus mucosal
thickening and periosteal thickening, including in the right
sphenoid. Visible tympanic cavities and mastoids are well
pneumatized.

Soft tissues: Negative visible noncontrast larynx, pharynx,
parapharyngeal spaces, retropharyngeal space, sublingual space,
submandibular spaces, parotid spaces, masticator spaces. Calcified
carotid atherosclerosis greater on the right. No upper cervical
lymphadenopathy.
IMPRESSION: 1. Trace parafalcine subdural hematoma suspected. No associated mass
effect and no other acute intracranial abnormality.
2. Left face and periorbital superficial soft tissue hematoma. No
face or skull fracture identified.
3. Chronic biparietal volume loss and cerebral white matter changes.
Chronic cervical spine degeneration with spinal stenosis.

ADDENDUM:
Study discussed by telephone with Dr. PAMEER IZEDYAR on 12/23/2018
at 5139 hours.

*** End of Addendum ***
FINDINGS: CT HEAD FINDINGS

Brain: Positive for trace para falcine subdural hematoma (coronal
image 43 series 5. No other subdural blood identified. No
intracranial mass effect. No midline shift.

No other acute intracranial hemorrhage identified. Patchy bilateral
white matter hypodensity has not significantly changed. There is
biparietal disproportionate cerebral volume loss which also appears
stable. Overall cerebral volume has not significantly changed since
2628. No ventriculomegaly. No cortically based acute infarct
identified.

Vascular: Calcified atherosclerosis at the skull base. No suspicious
intracranial vascular hyperdensity.

Skull: Calvarium appears stable and intact with hyperostosis.

Other: Mild left forehead contusion. See face soft tissue findings
below. Other scalp soft tissues are within normal limits.

CT MAXILLOFACIAL FINDINGS

Osseous: Absent dentition. TMJ degeneration. Mandible intact. No
maxilla or zygoma fracture identified. No nasal bone fracture.
Central skull base appears intact.

Visible cervical vertebrae appear grossly intact with advanced
degenerative changes. Degenerative cervical spinal stenosis, the
C5-6 level appears most stenotic.

Orbits: No orbital wall fracture. Globes appear symmetric and
intact. No intraorbital soft tissue abnormality.

Superficial left periorbital, premalar and lateral face superficial
hematoma with trace soft tissue gas (series 8, image 55).

Sinuses: Well pneumatized. Occasional paranasal sinus mucosal
thickening and periosteal thickening, including in the right
sphenoid. Visible tympanic cavities and mastoids are well
pneumatized.

Soft tissues: Negative visible noncontrast larynx, pharynx,
parapharyngeal spaces, retropharyngeal space, sublingual space,
submandibular spaces, parotid spaces, masticator spaces. Calcified
carotid atherosclerosis greater on the right. No upper cervical
lymphadenopathy.
IMPRESSION: 1. Trace parafalcine subdural hematoma suspected. No associated mass
effect and no other acute intracranial abnormality.
2. Left face and periorbital superficial soft tissue hematoma. No
face or skull fracture identified.
3. Chronic biparietal volume loss and cerebral white matter changes.
Chronic cervical spine degeneration with spinal stenosis.

## 2020-12-11 ENCOUNTER — Encounter: Payer: Self-pay | Admitting: Orthopedic Surgery

## 2020-12-11 ENCOUNTER — Non-Acute Institutional Stay (INDEPENDENT_AMBULATORY_CARE_PROVIDER_SITE_OTHER): Payer: Medicare Other | Admitting: Orthopedic Surgery

## 2020-12-11 DIAGNOSIS — Z0000 Encounter for general adult medical examination without abnormal findings: Secondary | ICD-10-CM

## 2020-12-11 NOTE — Progress Notes (Signed)
Provider:  Hazle Nordmann, NP  Location:  [facility]   Place of Service:      PCP: Mahlon Gammon, MD Patient Care Team: Mahlon Gammon, MD as PCP - General (Internal Medicine)  Extended Emergency Contact Information Primary Emergency Contact: Marval Regal Address: [address]          [place], [place] [address] Darden Amber of [place] Home Phone: [phone] Relation: Son  Code Status: DNR Goals of Care: Advanced Directive information Advanced Directives 12/11/2020  Does Patient Have a Medical Advance Directive? Yes  Type of Estate agent of [place];Living will;Out of facility DNR (pink MOST or yellow form)  Does patient want to make changes to medical advance directive? No - Patient declined  Copy of Healthcare Power of Attorney in Chart? Yes - validated most recent copy scanned in chart (See row information)  Would patient like information on creating a medical advance directive? -  Pre-existing out of facility DNR order (yellow form or pink MOST form) -     Chief Complaint  Patient presents with   Annual Exam    Annual wellness exam.    HPI: Patient is a 87 y.o. female seen today for an annual comprehensive examination.  Past Medical History:  Diagnosis Date   Alzheimer disease [facility])    Atrial fibrillation (HCC)    Hypertension    No past surgical history on file.  reports that she has never smoked. She has never used smokeless tobacco. She reports previous alcohol use. She reports that she does not use drugs. Social History   Socioeconomic History   Marital status: Single    Spouse name: Not on file   Number of children: Not on file   Years of education: Not on file   Highest education level: Not on file  Occupational History   Not on file  Tobacco Use   Smoking status: Never   Smokeless tobacco: Never  Vaping Use   Vaping Use: Unknown  Substance and Sexual Activity   Alcohol use: Not Currently   Drug use: Never    Sexual activity: Not Currently  Other Topics Concern   Not on file  Social History Narrative   ** Merged History Encounter **       Social Determinants of Health   Financial Resource Strain: Not on file  Food Insecurity: Not on file  Transportation Needs: Not on file  Physical Activity: Not on file  Stress: Not on file  Social Connections: Not on file  Intimate Partner Violence: Not on file   No family history on file.  Pertinent  Health Maintenance Due  Topic Date Due   URINE MICROALBUMIN  Never done   DEXA SCAN  10/26/2021 (Originally 07/18/1994)   INFLUENZA VACCINE  01/11/2021   PNA vac Low Risk Adult  Completed   No flowsheet data found. No flowsheet data found.  Functional Status Survey:    Allergies  Allergen Reactions   Bee Venom Other (See Comments)    Listed on MAR    Allergies as of 12/11/2020       Reactions   Bee Venom Other (See Comments)   Listed on [facility]        Medication List        Accurate as of December 11, 2020  2:42 PM. If you have any questions, ask your nurse or doctor.          STOP taking these medications    atorvastatin 10 MG  tablet Commonly known as: LIPITOR Stopped by: Octavia Heir, NP       TAKE these medications    apixaban 5 MG Tabs tablet Commonly known as: ELIQUIS Take 5 mg by mouth 2 (two) times daily.   bimatoprost 0.01 % Soln Commonly known as: LUMIGAN Place 1 drop into both eyes at bedtime.   CALCIUM 600 + D PO Take 1 tablet by mouth in the morning and at bedtime.   CENTRUM SILVER PO Take 1 tablet by mouth daily.   donepezil 5 MG tablet Commonly known as: ARICEPT Take 5 mg by mouth at bedtime.   furosemide 20 MG tablet Commonly known as: LASIX Take 20 mg by mouth daily.   Metoprolol Succinate 50 MG Cs24 Take 50 mg by mouth daily. Hold if Systolic is < 95 or if pulse is less than 60   zinc oxide 20 % ointment Apply 1 application topically as needed for irritation. Apply to buttocks after every  incontinent episode and as needed for redness        Review of Systems  Vitals:   12/11/20 1438  BP: 126/70  Pulse: 89  Resp: (!) 89  Temp: (!) 97.1 F (36.2 C)  SpO2: 99%  Weight: 179 lb 12.8 oz (81.6 kg)  Height: 5\' 1"  (1.549 m)   Body mass index is 33.97 kg/m. Physical Exam  Labs reviewed: Basic Metabolic Panel: Recent Labs    05/28/20 0000 09/07/20 0000 09/19/20 1003  NA 140 139 135  K 4.3 4.4 4.1  CL 103 101 99  CO2 28* 27* 29  GLUCOSE  --   --  139*  BUN 26* 24* 20  CREATININE 1.2* 1.1 1.04*  CALCIUM 9.0 9.5 9.3   Liver Function Tests: Recent Labs    02/27/20 0000 09/07/20 0000  AST 12* 17  ALT 9 13  ALKPHOS 81 98  ALBUMIN 3.2* 3.9   No results for input(s): LIPASE, AMYLASE in the last 8760 hours. No results for input(s): AMMONIA in the last 8760 hours. CBC: Recent Labs    02/27/20 0000 05/28/20 0000 09/07/20 0000 09/19/20 1003  WBC 6.5 5.9 9.6 12.2*  NEUTROABS 4,193  --  6,134.00  --   HGB 10.1* 9.4* 12.3 12.9  HCT 31* 29* 38 39.6  MCV  --   --   --  92.3  PLT 243 229 264 286   Cardiac Enzymes: No results for input(s): CKTOTAL, CKMB, CKMBINDEX, TROPONINI in the last 8760 hours. BNP: Invalid input(s): POCBNP Lab Results  Component Value Date   HGBA1C 5.3 11/13/2019   Lab Results  Component Value Date   TSH 1.75 11/25/2019   Lab Results  Component Value Date   VITAMINB12 475 05/28/2020   No results found for: FOLATE Lab Results  Component Value Date   FERRITIN 49 05/28/2020    Imaging and Procedures obtained recently: No results found.  Assessment/Plan There are no diagnoses linked to this encounter.   Family/ staff Communication:   Labs/tests ordered:

## 2020-12-11 NOTE — Progress Notes (Signed)
Subjective:   Tonna BoehringerGwendolyn L Woodward is a 87 y.o. female who presents for Medicare Annual (Subsequent) preventive examination.  Review of Systems     Cardiac Risk Factors include: advanced age (>4055men, 46>65 women);hypertension;obesity (BMI >30kg/m2)     Objective:    Today's Vitals   12/11/20 1438  BP: 126/70  Pulse: 89  Resp: (!) 89  Temp: (!) 97.1 F (36.2 C)  SpO2: 99%  Weight: 179 lb 12.8 oz (81.6 kg)  Height: 5\' 1"  (1.549 m)   Body mass index is 33.97 kg/m.  Advanced Directives 12/11/2020 12/11/2020 11/17/2020 10/26/2020 10/01/2020 09/04/2020 04/17/2020  Does Patient Have a Medical Advance Directive? Yes Yes Yes Yes Yes Yes Yes  Type of Estate agentAdvance Directive Healthcare Power of [place];Living will;Out of facility DNR (pink MOST or yellow form) Healthcare Power of [place] ParkAttorney;Living will;Out of facility DNR (pink MOST or yellow form) Healthcare Power of [place]orney;Living will;Out of facility DNR (pink MOST or yellow form) Healthcare Power of [place];Living will;Out of facility DNR (pink MOST or yellow form) Healthcare Power of [place];Living will;Out of facility DNR (pink MOST or yellow form) Healthcare Power of [place];Living will;Out of facility DNR (pink MOST or yellow form) Out of facility DNR (pink MOST or yellow form)  Does patient want to make changes to medical advance directive? No - Patient declined No - Patient declined No - Patient declined No - Patient declined No - Patient declined No - Patient declined No - Patient declined  Copy of Healthcare Power of Attorney in Chart? Yes - validated most recent copy scanned in chart (See row information) Yes - validated most recent copy scanned in chart (See row information) Yes - validated most recent copy scanned in chart (See row information) Yes - validated most recent copy scanned in chart (See row information) Yes - validated most recent copy scanned in chart (See row information) Yes - validated most recent copy scanned in chart (See row  information) -  Would patient like information on creating a medical advance directive? - - - - - - -  Pre-existing out of facility DNR order (yellow form or pink MOST form) - - Yellow form placed in chart (order not valid for inpatient use);Pink MOST form placed in chart (order not valid for inpatient use) Yellow form placed in chart (order not valid for inpatient use);Pink MOST form placed in chart (order not valid for inpatient use) Yellow form placed in chart (order not valid for inpatient use);Pink MOST form placed in chart (order not valid for inpatient use) Yellow form placed in chart (order not valid for inpatient use);Pink MOST form placed in chart (order not valid for inpatient use) Yellow form placed in chart (order not valid for inpatient use);Pink MOST form placed in chart (order not valid for inpatient use)    Current Medications (verified) Outpatient Encounter Medications as of 12/11/2020  Medication Sig   apixaban (ELIQUIS) 5 MG TABS tablet Take 5 mg by mouth 2 (two) times daily.   bimatoprost (LUMIGAN) 0.01 % SOLN Place 1 drop into both eyes at bedtime.    Calcium Carb-Cholecalciferol (CALCIUM 600 + D PO) Take 1 tablet by mouth in the morning and at bedtime.   donepezil (ARICEPT) 5 MG tablet Take 5 mg by mouth at bedtime.   furosemide (LASIX) 20 MG tablet Take 20 mg by mouth daily.   Metoprolol Succinate 50 MG CS24 Take 50 mg by mouth daily. Hold if Systolic is < 95 or if pulse is less than 60  Multiple Vitamins-Minerals (CENTRUM SILVER PO) Take 1 tablet by mouth daily.   zinc oxide 20 % ointment Apply 1 application topically as needed for irritation. Apply to buttocks after every incontinent episode and as needed for redness   [DISCONTINUED] atorvastatin (LIPITOR) 10 MG tablet Take 10 mg by mouth as directed. Every Monday and Friday   No facility-administered encounter medications on file as of 12/11/2020.    Allergies (verified) Bee venom   History: Past Medical History:   Diagnosis Date   Alzheimer disease (HCC)    Atrial fibrillation (HCC)    Hypertension    No past surgical history on file. No family history on file. Social History   Socioeconomic History   Marital status: Single    Spouse name: Not on file   Number of children: Not on file   Years of education: Not on file   Highest education level: Not on file  Occupational History   Not on file  Tobacco Use   Smoking status: Never   Smokeless tobacco: Never  Vaping Use   Vaping Use: Unknown  Substance and Sexual Activity   Alcohol use: Not Currently   Drug use: Never   Sexual activity: Not Currently  Other Topics Concern   Not on file  Social History Narrative   ** Merged History Encounter **       Social Determinants of Health   Financial Resource Strain: Not on file  Food Insecurity: Not on file  Transportation Needs: Not on file  Physical Activity: Not on file  Stress: Not on file  Social Connections: Not on file    Tobacco Counseling Counseling given: Not Answered   Clinical Intake:  Pre-visit preparation completed: Yes  Pain : No/denies pain BMI - recorded: 33.97 Nutritional Status: BMI > 30  Obese Nutritional Risks: None Diabetes: No  How often do you need to have someone help you when you read instructions, pamphlets, or other written materials from your doctor or pharmacy?: 5 - Always What is the last grade level you completed in school?: unable to answer- Alzheimers dementia  Diabetic?No  Interpreter Needed?: No      Activities of Daily Living In your present state of health, do you have any difficulty performing the following activities: 12/11/2020  Hearing? N  Vision? N  Difficulty concentrating or making decisions? Y  Walking or climbing stairs? Y  Dressing or bathing? Y  Doing errands, shopping? Y  Preparing Food and eating ? Y  Using the Toilet? Y  In the past six months, have you accidently leaked urine? Y  Do you have problems with loss  of bowel control? Y  Managing your Medications? Y  Managing your Finances? Y  Housekeeping or managing your Housekeeping? Y  Some recent data might be hidden    Patient Care Team: Mahlon Gammon, MD as PCP - General (Internal Medicine)  Indicate any recent Medical Services you may have received from other than [facility] providers in the past year (date may be approximate).     Assessment:   This is a routine wellness examination for Jasmine Woodward.  Hearing/Vision screen No results found.  Dietary issues and exercise activities discussed: Current Exercise Habits: The patient does not participate in regular exercise at present, Exercise limited by: neurologic condition(s);orthopedic condition(s)   Goals Addressed             This Visit's Progress    DIET - INCREASE WATER INTAKE   Not on track  Depression Screen PHQ 2/9 Scores 12/11/2020  PHQ - 2 Score 0    Fall Risk Fall Risk  12/11/2020  Falls in the past year? 1  Number falls in past yr: 1  Injury with Fall? 0  Risk for fall due to : History of fall(s);Impaired balance/gait;Impaired mobility;Mental status change  Follow up Falls evaluation completed;Education provided;Falls prevention discussed    FALL RISK PREVENTION PERTAINING TO THE HOME:  Any stairs in or around the home? No  If so, are there any without handrails? No  Home free of loose throw rugs in walkways, pet beds, electrical cords, etc? Yes  Adequate lighting in your home to reduce risk of falls? Yes   ASSISTIVE DEVICES UTILIZED TO PREVENT FALLS:  Life alert? Yes  Use of a cane, walker or w/c? Yes  Grab bars in the bathroom? Yes  Shower chair or bench in shower? Yes  Elevated toilet seat or a handicapped toilet? Yes   TIMED UP AND GO:  Was the test performed? No .  Length of time to ambulate 10 feet:  sec.   Gait slow and steady with assistive device  Cognitive Function: MMSE - Mini Mental State Exam 05/06/2020  Orientation to time 0   Orientation to Place 1  Registration 2  Attention/ Calculation 0  Recall 2  Language- name 2 objects 2  Language- repeat 0  Language- follow 3 step command 3  Language- read & follow direction 1  Write a sentence 1  Copy design 0  Copy design-comments 3 animals  Total score 12        Immunizations Immunization History  Administered Date(s) Administered   Influenza-Unspecified 04/02/2014, 04/01/2016, 04/14/2017, 03/17/2020   Moderna SARS-COV2 Booster Vaccination 11/18/2020   Moderna Sars-Covid-2 Vaccination 06/17/2019, 07/15/2019, 04/27/2020   Pneumococcal Conjugate-13 03/24/2014   Pneumococcal Polysaccharide-23 02/26/2009   Tdap 03/08/2012, 12/23/2018   Zoster, Live 09/03/2007    TDAP status: Up to date  Flu Vaccine status: Up to date  Pneumococcal vaccine status: Up to date  Covid-19 vaccine status: Completed vaccines  Qualifies for Shingles Vaccine? Yes   Zostavax completed Yes   Shingrix Completed?: No.    Education has been provided regarding the importance of this vaccine. Patient has been advised to call insurance company to determine out of pocket expense if they have not yet received this vaccine. Advised may also receive vaccine at local pharmacy or Health Dept. Verbalized acceptance and understanding.  Screening Tests Health Maintenance  Topic Date Due   URINE MICROALBUMIN  Never done   Zoster Vaccines- Shingrix (1 of 2) Never done   DEXA SCAN  10/26/2021 (Originally 07[birth year]18/1994)   INFLUENZA VACCINE  01/11/2021   TETANUS/TDAP  12/22/2028   COVID-19 Vaccine  Completed   PNA vac Low Risk Adult  Completed   HPV VACCINES  Aged Out    Health Maintenance  Health Maintenance Due  Topic Date Due   URINE MICROALBUMIN  Never done   Zoster Vaccines- Shingrix (1 of 2) Never done    Colorectal cancer screening: No longer required.   Mammogram status: No longer required due to advanced age.  Bone Density status: Completed unknown. Results reflect: Bone  density results: NORMAL. Repeat every unknown years.  Lung Cancer Screening: (Low Dose CT Chest recommended if Age 16-80 years, 30 pack-year currently smoking OR have quit w/in 15years.) does not qualify.   Lung Cancer Screening Referral: No  Additional Screening:  Hepatitis C Screening: does not qualify; Completed   Vision Screening: Recommended  annual ophthalmology exams for early detection of glaucoma and other disorders of the eye. Is the patient up to date with their annual eye exam?  No  Who is the provider or what is the name of the office in which the patient attends annual eye exams? Unknown If pt is not established with a provider, would they like to be referred to a provider to establish care? No .   Dental Screening: Recommended annual dental exams for proper oral hygiene  Community Resource Referral / Chronic Care Management: CRR required this visit?  No   CCM required this visit?  No      Plan:     I have personally reviewed and noted the following in the patient's chart:   Medical and social history Use of alcohol, tobacco or illicit drugs  Current medications and supplements including opioid prescriptions.  Functional ability and status Nutritional status Physical activity Advanced directives List of other physicians Hospitalizations, surgeries, and ER visits in previous 12 months Vitals Screenings to include cognitive, depression, and falls Referrals and appointments  In addition, I have reviewed and discussed with patient certain preventive protocols, quality metrics, and best practice recommendations. A written personalized care plan for preventive services as well as general preventive health recommendations were provided to patient.     Octavia Heir, NP   12/11/2020

## 2020-12-11 NOTE — Patient Instructions (Signed)
  Ms. Coriz , Thank you for taking time to come for your Medicare Wellness Visit. I appreciate your ongoing commitment to your health goals. Please review the following plan we discussed and let me know if I can assist you in the future.   These are the goals we discussed:  Goals      DIET - INCREASE WATER INTAKE        This is a list of the screening recommended for you and due dates:  Health Maintenance  Topic Date Due   Urine Protein Check  Never done   Zoster (Shingles) Vaccine (1 of 2) Never done   DEXA scan (bone density measurement)  10/26/2021*   Flu Shot  01/11/2021   Tetanus Vaccine  12/22/2028   COVID-19 Vaccine  Completed   Pneumonia vaccines  Completed   HPV Vaccine  Aged Out  *Topic was postponed. The date shown is not the original due date.

## 2020-12-24 ENCOUNTER — Non-Acute Institutional Stay (SKILLED_NURSING_FACILITY): Payer: Medicare Other | Admitting: Internal Medicine

## 2020-12-24 ENCOUNTER — Encounter: Payer: Self-pay | Admitting: Internal Medicine

## 2020-12-24 DIAGNOSIS — D631 Anemia in chronic kidney disease: Secondary | ICD-10-CM

## 2020-12-24 DIAGNOSIS — R609 Edema, unspecified: Secondary | ICD-10-CM

## 2020-12-24 DIAGNOSIS — N183 Chronic kidney disease, stage 3 unspecified: Secondary | ICD-10-CM

## 2020-12-24 DIAGNOSIS — I63431 Cerebral infarction due to embolism of right posterior cerebral artery: Secondary | ICD-10-CM | POA: Diagnosis not present

## 2020-12-24 DIAGNOSIS — I480 Paroxysmal atrial fibrillation: Secondary | ICD-10-CM | POA: Diagnosis not present

## 2020-12-24 DIAGNOSIS — E782 Mixed hyperlipidemia: Secondary | ICD-10-CM | POA: Diagnosis not present

## 2020-12-24 DIAGNOSIS — F0280 Dementia in other diseases classified elsewhere without behavioral disturbance: Secondary | ICD-10-CM

## 2020-12-24 DIAGNOSIS — G309 Alzheimer's disease, unspecified: Secondary | ICD-10-CM

## 2020-12-24 DIAGNOSIS — N1832 Chronic kidney disease, stage 3b: Secondary | ICD-10-CM

## 2020-12-24 NOTE — Progress Notes (Signed)
Location:   [facility] Room Number: 7 Place of Service:  SNF [phone]) Provider:  Einar Crow MD   Mahlon Gammon, MD  Patient Care Team: Mahlon Gammon, MD as PCP - General (Internal Medicine)  Extended Emergency Contact Information Primary Emergency Contact: Marval Regal Address: [address]          [place], [place] [address] Darden Amber of [place] Home Phone: [phone] Relation: Son  Code Status:  DNR Goals of care: Advanced Directive information Advanced Directives 12/24/2020  Does Patient Have a Medical Advance Directive? Yes  Type of Estate agent of [place];Living will;Out of facility DNR (pink MOST or yellow form)  Does patient want to make changes to medical advance directive? No - Patient declined  Copy of Healthcare Power of Attorney in Chart? Yes - validated most recent copy scanned in chart (See row information)  Would patient like information on creating a medical advance directive? -  Pre-existing out of facility DNR order (yellow form or pink MOST form) -     Chief Complaint  Patient presents with   Medical Management of Chronic Issues    HPI:  Pt is a 87 y.o. female seen today for medical management of chronic diseases.    Is Long term resident   She was admitted after she had a acute right PCA infarct.  Has been on Eliquis since then.  Also has a history of Alzheimer's dementia, hypertension, A. fib.  Her echo had a EF of 60 to 65% with LA dilated CT scan showed Temporal Atrophy and Chronic Cerebral Infarcts  Is staying stable Weight is stable No New issues Her daughter from put of town was visiting few months ago and wanted to stop her Statin No New falls    Past Medical History:  Diagnosis Date   Alzheimer disease (HCC)    Atrial fibrillation (HCC)    Hypertension    History reviewed. No pertinent surgical history.  Allergies  Allergen Reactions   Bee Venom Other (See Comments)     Listed on MAR    Allergies as of 12/24/2020       Reactions   Bee Venom Other (See Comments)   Listed on [facility]        Medication List        Accurate as of December 24, 2020 11:59 PM. If you have any questions, ask your nurse or doctor.          apixaban 5 MG Tabs tablet Commonly known as: ELIQUIS Take 5 mg by mouth 2 (two) times daily.   bimatoprost 0.01 % Soln Commonly known as: LUMIGAN Place 1 drop into both eyes at bedtime.   CALCIUM 600 + D PO Take 1 tablet by mouth in the morning and at bedtime.   CENTRUM SILVER PO Take 1 tablet by mouth daily.   donepezil 5 MG tablet Commonly known as: ARICEPT Take 5 mg by mouth at bedtime.   furosemide 20 MG tablet Commonly known as: LASIX Take 20 mg by mouth daily.   Metoprolol Succinate 50 MG Cs24 Take 50 mg by mouth daily. Hold if Systolic is < 95 or if pulse is less than 60   zinc oxide 20 % ointment Apply 1 application topically as needed for irritation. Apply to buttocks after every incontinent episode and as needed for redness        Review of Systems  Unable to perform ROS: Dementia   Immunization History  Administered  Date(s) Administered   Influenza-Unspecified 04/02/2014, 04/01/2016, 04/14/2017, 03/17/2020   Moderna SARS-COV2 Booster Vaccination 11/18/2020   Moderna Sars-Covid-2 Vaccination 06/17/2019, 07/15/2019, 04/27/2020   Pneumococcal Conjugate-13 03/24/2014   Pneumococcal Polysaccharide-23 02/26/2009   Tdap 03/08/2012, 12/23/2018   Zoster, Live 09/03/2007   Pertinent  Health Maintenance Due  Topic Date Due   URINE MICROALBUMIN  Never done   DEXA SCAN  10/26/2021 (Originally 07/18/1994)   INFLUENZA VACCINE  01/11/2021   PNA vac Low Risk Adult  Completed   Fall Risk  12/11/2020  Falls in the past year? 1  Number falls in past yr: 1  Injury with Fall? 0  Risk for fall due to : History of fall(s);Impaired balance/gait;Impaired mobility;Mental status change  Follow up Falls evaluation  completed;Education provided;Falls prevention discussed   Functional Status Survey:    Vitals:   12/24/20 1559  BP: 119/87  Pulse: 100  Resp: 20  Temp: (!) 96.8 F (36 C)  SpO2: 99%  Weight: 180 lb (81.6 kg)  Height: 5\' 1"  (1.549 m)   Body mass index is 34.01 kg/m. Physical Exam Constitutional: . Well-developed and well-nourished.  HENT:  Head: Normocephalic.  Mouth/Throat: Oropharynx is clear and moist.  Eyes: Pupils are equal, round, and reactive to light.  Neck: Neck supple.  Cardiovascular: Normal rate and normal heart sounds.  No murmur heard. Pulmonary/Chest: Effort normal and breath sounds normal. No respiratory distress. No wheezes. She has no rales.  Abdominal: Soft. Bowel sounds are normal. No distension. There is no tenderness. There is no rebound.  Musculoskeletal: No edema.  Lymphadenopathy: none Neurological: Walks with her walker.  Skin: Skin is warm and dry.  Psychiatric: Normal mood and affect. Behavior is normal. Thought content normal.   Labs reviewed: Recent Labs    05/28/20 0000 09/07/20 0000 09/19/20 1003  NA 140 139 135  K 4.3 4.4 4.1  CL 103 101 99  CO2 28* 27* 29  GLUCOSE  --   --  139*  BUN 26* 24* 20  CREATININE 1.2* 1.1 1.04*  CALCIUM 9.0 9.5 9.3   Recent Labs    02/27/20 0000 09/07/20 0000  AST 12* 17  ALT 9 13  ALKPHOS 81 98  ALBUMIN 3.2* 3.9   Recent Labs    02/27/20 0000 05/28/20 0000 09/07/20 0000 09/19/20 1003  WBC 6.5 5.9 9.6 12.2*  NEUTROABS 4,193  --  6,134.00  --   HGB 10.1* 9.4* 12.3 12.9  HCT 31* 29* 38 39.6  MCV  --   --   --  92.3  PLT 243 229 264 286   Lab Results  Component Value Date   TSH 1.75 11/25/2019   Lab Results  Component Value Date   HGBA1C 5.3 11/13/2019   Lab Results  Component Value Date   CHOL 89 02/27/2020   HDL 46 02/27/2020   LDLCALC 30 02/27/2020   TRIG 51 02/27/2020   CHOLHDL 2.2 11/13/2019    Significant Diagnostic Results in last 30 days:  No results  found.  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

## 2021-01-15 ENCOUNTER — Non-Acute Institutional Stay (SKILLED_NURSING_FACILITY): Payer: Medicare Other | Admitting: Orthopedic Surgery

## 2021-01-15 ENCOUNTER — Encounter: Payer: Self-pay | Admitting: Orthopedic Surgery

## 2021-01-15 DIAGNOSIS — R609 Edema, unspecified: Secondary | ICD-10-CM | POA: Diagnosis not present

## 2021-01-15 DIAGNOSIS — I480 Paroxysmal atrial fibrillation: Secondary | ICD-10-CM | POA: Diagnosis not present

## 2021-01-15 DIAGNOSIS — I10 Essential (primary) hypertension: Secondary | ICD-10-CM

## 2021-01-15 DIAGNOSIS — G309 Alzheimer's disease, unspecified: Secondary | ICD-10-CM | POA: Diagnosis not present

## 2021-01-15 DIAGNOSIS — N1831 Chronic kidney disease, stage 3a: Secondary | ICD-10-CM

## 2021-01-15 DIAGNOSIS — H6123 Impacted cerumen, bilateral: Secondary | ICD-10-CM

## 2021-01-15 DIAGNOSIS — I63431 Cerebral infarction due to embolism of right posterior cerebral artery: Secondary | ICD-10-CM | POA: Diagnosis not present

## 2021-01-15 DIAGNOSIS — F0280 Dementia in other diseases classified elsewhere without behavioral disturbance: Secondary | ICD-10-CM

## 2021-01-15 NOTE — Progress Notes (Signed)
Location:  [facility] Room Number: 7 Place of Service:  SNF [phone]) Provider:  Hazle Nordmann, AGNP-C  Mahlon Gammon, MD  Patient Care Team: Mahlon Gammon, MD as PCP - General (Internal Medicine)  Extended Emergency Contact Information Primary Emergency Contact: Marval Regal Address: [address]          [place], [place] [address] Darden Amber of [place] Home Phone: [phone] Relation: Son  Code Status:  DNR Goals of care: Advanced Directive information Advanced Directives 12/24/2020  Does Patient Have a Medical Advance Directive? Yes  Type of Estate agent of [place];Living will;Out of facility DNR (pink MOST or yellow form)  Does patient want to make changes to medical advance directive? No - Patient declined  Copy of Healthcare Power of Attorney in Chart? Yes - validated most recent copy scanned in chart (See row information)  Would patient like information on creating a medical advance directive? -  Pre-existing out of facility DNR order (yellow form or pink MOST form) -     Chief Complaint  Patient presents with   Medical Management of Chronic Issues   Health Maintenance    HPI:  Pt is a 87 y.o. female seen today for medical management of chronic diseases.    She continues to reside on the skilled nursing unit at [facility]. Past medical history includes: CVA, hypertension, atrial fibrillation, Alzheimer's, CKD stage 3, hyperlipidemia and anemia related to CKD.   Hx CVA- embolism of right posterior cerebral artery 11/2019, remains on eliquis, off statin per family request. CT head 09/2020 revealed chronic cerebral infarcts and advanced bilateral temporal lobe atrophy.  Alzheimers- no recent behavioral outbursts, BIMS score 4, MMSE 12/30, remains on aricept.  Atrial fib- remains on metoprolol and eliquis Edema- furosemide 20 mg daily Hypertension- BUN/creat 20/1.04 09/19/2020, remains on metoprolol daily  No  recent falls or injuries.   Recent blood pressure:  08/05- 127/82  08/04- 134/69  08/03- 132/67  Recent weights:  08/02- 175 lbs  07/04- 180 lbs  06/01- 179.8 lbs  Very pleasant today. Follows some commands during exam.   Nurse does not report any other concerns, vitals stable.        Past Medical History:  Diagnosis Date   Alzheimer disease [facility])    Atrial fibrillation (HCC)    Hypertension    No past surgical history on file.  Allergies  Allergen Reactions   Bee Venom Other (See Comments)    Listed on [facility]    Outpatient Encounter Medications as of 01/15/2021  Medication Sig   apixaban (ELIQUIS) 5 MG TABS tablet Take 5 mg by mouth 2 (two) times daily.   bimatoprost (LUMIGAN) 0.01 % SOLN Place 1 drop into both eyes at bedtime.    Calcium Carb-Cholecalciferol (CALCIUM 600 + D PO) Take 1 tablet by mouth in the morning and at bedtime.   donepezil (ARICEPT) 5 MG tablet Take 5 mg by mouth at bedtime.   furosemide (LASIX) 20 MG tablet Take 20 mg by mouth daily.   Metoprolol Succinate 50 MG CS24 Take 50 mg by mouth daily. Hold if Systolic is < 95 or if pulse is less than 60   Multiple Vitamins-Minerals (CENTRUM SILVER PO) Take 1 tablet by mouth daily.   zinc oxide 20 % ointment Apply 1 application topically as needed for irritation. Apply to buttocks after every incontinent episode and as needed for redness   No facility-administered encounter medications on file as of 01/15/2021.  Review of Systems  Unable to perform ROS: Dementia   Immunization History  Administered Date(s) Administered   Influenza-Unspecified 04/02/2014, 04/01/2016, 04/14/2017, 03/17/2020   Moderna SARS-COV2 Booster Vaccination 11/18/2020   Moderna Sars-Covid-2 Vaccination 06/17/2019, 07/15/2019, 04/27/2020   Pneumococcal Conjugate-13 03/24/2014   Pneumococcal Polysaccharide-23 02/26/2009   Tdap 03/08/2012, 12/23/2018   Zoster, Live 09/03/2007   Pertinent  Health Maintenance Due  Topic Date Due    URINE MICROALBUMIN  Never done   INFLUENZA VACCINE  01/11/2021   DEXA SCAN  10/26/2021 (Originally 07/18/1994)   PNA vac Low Risk Adult  Completed   Fall Risk  12/11/2020  Falls in the past year? 1  Number falls in past yr: 1  Injury with Fall? 0  Risk for fall due to : History of fall(s);Impaired balance/gait;Impaired mobility;Mental status change  Follow up Falls evaluation completed;Education provided;Falls prevention discussed   Functional Status Survey:    Vitals:   01/15/21 1405  BP: 127/82  Pulse: 87  Resp: 16  Temp: (!) 96.8 F (36 C)  SpO2: 95%  Weight: 175 lb (79.4 kg)  Height: 5\' 1"  (1.549 m)   Body mass index is 33.07 kg/m. Physical Exam Vitals reviewed.  Constitutional:      General: She is not in acute distress. HENT:     Head: Normocephalic.     Right Ear: There is impacted cerumen.     Left Ear: There is impacted cerumen.     Nose: Nose normal.  Eyes:     General:        Right eye: No discharge.        Left eye: No discharge.  Neck:     Vascular: No carotid bruit.  Cardiovascular:     Rate and Rhythm: Normal rate. Rhythm irregular.     Pulses: Normal pulses.     Heart sounds: Normal heart sounds. No murmur heard. Pulmonary:     Effort: Pulmonary effort is normal. No respiratory distress.     Breath sounds: Normal breath sounds. No wheezing.  Abdominal:     General: Bowel sounds are normal. There is no distension.     Palpations: Abdomen is soft.     Tenderness: There is no abdominal tenderness.  Musculoskeletal:     Cervical back: Normal range of motion.     Right lower leg: Edema present.     Left lower leg: Edema present.     Comments: Non-pitting  Lymphadenopathy:     Cervical: No cervical adenopathy.  Skin:    General: Skin is warm and dry.     Capillary Refill: Capillary refill takes less than 2 seconds.  Neurological:     General: No focal deficit present.     Mental Status: She is alert. Mental status is at baseline.     Motor:  Weakness present.     Gait: Gait abnormal.     Comments: wheelchair  Psychiatric:        Mood and Affect: Mood normal.        Behavior: Behavior normal.        Cognition and Memory: Memory is impaired.    Labs reviewed: Recent Labs    05/28/20 0000 09/07/20 0000 09/19/20 1003  NA 140 139 135  K 4.3 4.4 4.1  CL 103 101 99  CO2 28* 27* 29  GLUCOSE  --   --  139*  BUN 26* 24* 20  CREATININE 1.2* 1.1 1.04*  CALCIUM 9.0 9.5 9.3   Recent Labs  02/27/20 0000 09/07/20 0000  AST 12* 17  ALT 9 13  ALKPHOS 81 98  ALBUMIN 3.2* 3.9   Recent Labs    02/27/20 0000 05/28/20 0000 09/07/20 0000 09/19/20 1003  WBC 6.5 5.9 9.6 12.2*  NEUTROABS 4,193  --  6,134.00  --   HGB 10.1* 9.4* 12.3 12.9  HCT 31* 29* 38 39.6  MCV  --   --   --  92.3  PLT 243 229 264 286   Lab Results  Component Value Date   TSH 1.75 11/25/2019   Lab Results  Component Value Date   HGBA1C 5.3 11/13/2019   Lab Results  Component Value Date   CHOL 89 02/27/2020   HDL 46 02/27/2020   LDLCALC 30 02/27/2020   TRIG 51 02/27/2020   CHOLHDL 2.2 11/13/2019    Significant Diagnostic Results in last 30 days:  No results found.  Assessment/Plan 1. Cerebrovascular accident (CVA) due to embolism of right posterior cerebral artery (HCC) - remains on eliquis - statin stopped a few months ago per family  2. Alzheimer disease (HCC) - no behavioral outbursts - MMSE 12/30, recent BIMS 4 - CT head 09/2020 revealed chronic cerebral infarcts and advanced bilateral temporal lobe atrophy.  - cont skilled nursing care  3. Paroxysmal atrial fibrillation (HCC) - rate controlled with metoprolol  - remains on eliquis for clot prevention  4. Edema, unspecified type - non-pitting ankle edema - cont furosemide 20 mg daily  5. Essential hypertension - controlled - cont metoprolol  6. Stage 3a chronic kidney disease (HCC) - GFR 51 09/19/2020 - continue to avoid nephrotoxic drugs and dose adjust medications  to be renally excreted  7. Bilateral impacted cerumen - cannot visualize TM due to wax - debrox 5 gtts bid x 7 days - flush ears with warm water when debrox complete    Family/ staff Communication: plan discussed with nurse  Labs/tests ordered:  none

## 2021-02-17 ENCOUNTER — Encounter: Payer: Self-pay | Admitting: Orthopedic Surgery

## 2021-02-17 ENCOUNTER — Non-Acute Institutional Stay (SKILLED_NURSING_FACILITY): Payer: Medicare Other | Admitting: Orthopedic Surgery

## 2021-02-17 DIAGNOSIS — F0280 Dementia in other diseases classified elsewhere without behavioral disturbance: Secondary | ICD-10-CM

## 2021-02-17 DIAGNOSIS — I480 Paroxysmal atrial fibrillation: Secondary | ICD-10-CM | POA: Diagnosis not present

## 2021-02-17 DIAGNOSIS — I10 Essential (primary) hypertension: Secondary | ICD-10-CM | POA: Diagnosis not present

## 2021-02-17 DIAGNOSIS — G309 Alzheimer's disease, unspecified: Secondary | ICD-10-CM

## 2021-02-17 DIAGNOSIS — I63431 Cerebral infarction due to embolism of right posterior cerebral artery: Secondary | ICD-10-CM

## 2021-02-17 NOTE — Progress Notes (Signed)
Location:   [facility] Room Number: N07 Place of Service:  SNF (31) Provider:  Hazle Nordmann, NP    Patient Care Team: Mahlon Gammon, MD as PCP - General (Internal Medicine)  Extended Emergency Contact Information Primary Emergency Contact: Marval Regal Address: [address]          [place], [place] [address] Darden [place] of [place] Home Phone: [phone] Relation: Son  Code Status:  DNR Goals of care: Advanced Directive information Advanced Directives 02/17/2021  Does Patient Have a Medical Advance Directive? Yes  Type of Estate agent of [place];Living will;Out of facility DNR (pink MOST or yellow form)  Does patient want to make changes to medical advance directive? No - Patient declined  Copy of Healthcare Power of Attorney in Chart? Yes - validated most recent copy scanned in chart (See row information)  Would patient like information on creating a medical advance directive? -  Pre-existing out of facility DNR order (yellow form or pink MOST form) Yellow form placed in chart (order not valid for inpatient use);Pink MOST form placed in chart (order not valid for inpatient use)     Chief Complaint  Patient presents with   Medical Management of Chronic Issues    Routine follow up    Health Maintenance    Discuss need for urine microalbumin, shingles vaccine, and influenza vaccine.     HPI:  Pt is a 87 y.o. female seen today for medical management of chronic diseases.    She continues to reside on the skilled nursing unit at [facility]. Past medical history includes: CVA, hypertension, atrial fibrillation, Alzheimer's, CKD stage 3, hyperlipidemia and anemia related to CKD.   Afib- rate controlled with metoprolol, eliquis for clot prevention HTN- BUN/creat 20/ 1.04 09/19/2020, remains on metoprolol  Hx CVA- 11/2019 MRI brain revealed embolism of right posterior cerebral artery, 09/2020 CT head revealed chronic  cerebral infarcts and advanced bilateral temporal lobe atrophy Alzheimer's- no recent behavioral outbursts, BIMS score 4 12/2020, MMSE 12/30, remains on aricept. Today, she can follow simple commands during exam. She will respond with simple " yes" or "no" phrases most of the time. When she cannot answer question she will pause and stare.   No recent falls or injuries, ambulates with walker.   Recent blood pressures:   09/07- 133/57  09/06- 154/72, 133/60  09/05- 137/60  Recent weights:  09/01- 180 lbs  08/02- 175 lbs  07/04- 180 lbs  Nurse does not report any concerns, vitals stable.     Past Medical History:  Diagnosis Date   Alzheimer disease [facility])    Atrial fibrillation (HCC)    Hypertension    History reviewed. No pertinent surgical history.  Allergies  Allergen Reactions   Bee Venom Other (See Comments)    Listed on MAR    Allergies as of 02/17/2021       Reactions   Bee Venom Other (See Comments)   Listed on [facility]        Medication List        Accurate as of February 17, 2021  9:22 AM. If you have any questions, ask your nurse or doctor.          apixaban 5 MG Tabs tablet Commonly known as: ELIQUIS Take 5 mg by mouth 2 (two) times daily.   bimatoprost 0.01 % Soln Commonly known as: LUMIGAN Place 1 drop into both eyes at bedtime.   CALCIUM 600 + D PO Take  1 tablet by mouth in the morning and at bedtime.   CENTRUM SILVER PO Take 1 tablet by mouth daily.   donepezil 5 MG tablet Commonly known as: ARICEPT Take 5 mg by mouth at bedtime.   furosemide 20 MG tablet Commonly known as: LASIX Take 20 mg by mouth daily.   Metoprolol Succinate 50 MG Cs24 Take 50 mg by mouth daily. Hold if Systolic is < 95 or if pulse is less than 60   zinc oxide 20 % ointment Apply 1 application topically as needed for irritation. Apply to buttocks after every incontinent episode and as needed for redness        Review of Systems  Unable to perform ROS:  Dementia   Immunization History  Administered Date(s) Administered   Influenza-Unspecified 04/02/2014, 04/01/2016, 04/14/2017, 03/17/2020   Moderna SARS-COV2 Booster Vaccination 11/18/2020   Moderna Sars-Covid-2 Vaccination 06/17/2019, 07/15/2019, 04/27/2020   Pneumococcal Conjugate-13 03/24/2014   Pneumococcal Polysaccharide-23 02/26/2009   Tdap 03/08/2012, 12/23/2018   Zoster, Live 09/03/2007   Pertinent  Health Maintenance Due  Topic Date Due   URINE MICROALBUMIN  Never done   INFLUENZA VACCINE  01/11/2021   DEXA SCAN  10/26/2021 (Originally 07/18/1994)   PNA vac Low Risk Adult  Completed   Fall Risk  12/11/2020  Falls in the past year? 1  Number falls in past yr: 1  Injury with Fall? 0  Risk for fall due to : History of fall(s);Impaired balance/gait;Impaired mobility;Mental status change  Follow up Falls evaluation completed;Education provided;Falls prevention discussed   Functional Status Survey:    Vitals:   02/17/21 0918  BP: (!) 154/72  Pulse: 61  Resp: 20  Temp: (!) 97.3 F (36.3 C)  SpO2: 98%  Weight: 180 lb (81.6 kg)  Height: 5\' 1"  (1.549 m)   Body mass index is 34.01 kg/m. Physical Exam Vitals reviewed.  Constitutional:      General: She is not in acute distress. HENT:     Head: Normocephalic.     Right Ear: There is no impacted cerumen.     Left Ear: There is no impacted cerumen.     Nose: Nose normal.     Mouth/Throat:     Mouth: Mucous membranes are moist.  Eyes:     General:        Right eye: No discharge.        Left eye: No discharge.  Neck:     Vascular: No carotid bruit.  Cardiovascular:     Rate and Rhythm: Normal rate. Rhythm irregular.     Pulses: Normal pulses.     Heart sounds: Normal heart sounds.  Pulmonary:     Effort: Pulmonary effort is normal. No respiratory distress.     Breath sounds: Normal breath sounds. No wheezing.  Abdominal:     General: Bowel sounds are normal. There is no distension.     Palpations: Abdomen is  soft.     Tenderness: There is no abdominal tenderness.  Musculoskeletal:     Cervical back: Normal range of motion.     Right lower leg: Edema present.     Left lower leg: Edema present.     Comments: Non-pitting  Lymphadenopathy:     Cervical: No cervical adenopathy.  Skin:    General: Skin is warm and dry.  Neurological:     General: No focal deficit present.     Mental Status: She is alert. Mental status is at baseline.     Motor: Weakness present.  Gait: Gait abnormal.     Comments: walker  Psychiatric:        Mood and Affect: Mood normal. Affect is flat.        Behavior: Behavior normal.        Cognition and Memory: Memory is impaired.    Labs reviewed: Recent Labs    05/28/20 0000 09/07/20 0000 09/19/20 1003  NA 140 139 135  K 4.3 4.4 4.1  CL 103 101 99  CO2 28* 27* 29  GLUCOSE  --   --  139*  BUN 26* 24* 20  CREATININE 1.2* 1.1 1.04*  CALCIUM 9.0 9.5 9.3   Recent Labs    02/27/20 0000 09/07/20 0000  AST 12* 17  ALT 9 13  ALKPHOS 81 98  ALBUMIN 3.2* 3.9   Recent Labs    02/27/20 0000 05/28/20 0000 09/07/20 0000 09/19/20 1003  WBC 6.5 5.9 9.6 12.2*  NEUTROABS 4,193  --  6,134.00  --   HGB 10.1* 9.4* 12.3 12.9  HCT 31* 29* 38 39.6  MCV  --   --   --  92.3  PLT 243 229 264 286   Lab Results  Component Value Date   TSH 1.75 11/25/2019   Lab Results  Component Value Date   HGBA1C 5.3 11/13/2019   Lab Results  Component Value Date   CHOL 89 02/27/2020   HDL 46 02/27/2020   LDLCALC 30 02/27/2020   TRIG 51 02/27/2020   CHOLHDL 2.2 11/13/2019    Significant Diagnostic Results in last 30 days:  No results found.  Assessment/Plan 1. Paroxysmal atrial fibrillation (HCC) - rate controlled with metoprolol - cont Eliquis for clot prevention  2. Essential hypertension - controlled - cont metoprolol - cont NAS diet  3. Cerebrovascular accident (CVA) due to embolism of right posterior cerebral artery (HCC) - 11/2019 MRI brain revealed  embolism of right posterior cerebral artery - 09/2020 CT head revealed chronic cerebral infarcts and advanced bilateral temporal lobe atrophy  4. Alzheimer disease (HCC) - 12/2020 BIMS 4 - MMSE 12/30 - no behavioral outbursts - cont Aricept - cont skilled nursing care   Family/ staff Communication: plan discussed with nurse  Labs/tests ordered:  none

## 2021-03-15 ENCOUNTER — Non-Acute Institutional Stay (SKILLED_NURSING_FACILITY): Payer: Medicare Other | Admitting: Orthopedic Surgery

## 2021-03-15 ENCOUNTER — Encounter: Payer: Self-pay | Admitting: Orthopedic Surgery

## 2021-03-15 DIAGNOSIS — I63431 Cerebral infarction due to embolism of right posterior cerebral artery: Secondary | ICD-10-CM | POA: Diagnosis not present

## 2021-03-15 DIAGNOSIS — I480 Paroxysmal atrial fibrillation: Secondary | ICD-10-CM | POA: Diagnosis not present

## 2021-03-15 DIAGNOSIS — E782 Mixed hyperlipidemia: Secondary | ICD-10-CM

## 2021-03-15 DIAGNOSIS — R609 Edema, unspecified: Secondary | ICD-10-CM

## 2021-03-15 DIAGNOSIS — G309 Alzheimer's disease, unspecified: Secondary | ICD-10-CM

## 2021-03-15 DIAGNOSIS — I10 Essential (primary) hypertension: Secondary | ICD-10-CM

## 2021-03-15 DIAGNOSIS — F0280 Dementia in other diseases classified elsewhere without behavioral disturbance: Secondary | ICD-10-CM

## 2021-03-15 NOTE — Progress Notes (Signed)
Location:   [facility] Room Number: 7 [phone] Place of Service:  SNF (31) Provider:  Hazle Nordmann, NP  Mahlon Gammon, MD  Patient Care Team: Mahlon Gammon, MD as PCP - General (Internal Medicine)  Extended Emergency Contact Information Primary Emergency Contact: Marval Regal Address: [address]          [place], [place] [address] Darden [place] of [place] Home Phone: [phone] Relation: Son  Code Status:  DNR Goals of care: Advanced Directive information Advanced Directives 03/15/2021  Does Patient Have a Medical Advance Directive? Yes  Type of Estate agent of [place];Living will;Out of facility DNR (pink MOST or yellow form)  Does patient want to make changes to medical advance directive? No - Patient declined  Copy of Healthcare Power of Attorney in Chart? Yes - validated most recent copy scanned in chart (See row information)  Would patient like information on creating a medical advance directive? -  Pre-existing out of facility DNR order (yellow form or pink MOST form) Yellow form placed in chart (order not valid for inpatient use)     Chief Complaint  Patient presents with   Medical Management of Chronic Issues    Routine follow up visit    HPI:  Pt is a 87 y.o. female seen today for medical management of chronic diseases.    She continues to reside on the skilled nursing unit at [facility]. Past medical history includes: CVA, hypertension, atrial fibrillation, Alzheimer's, CKD stage 3, hyperlipidemia and anemia related to CKD.    Afib- rate controlled with metoprolol, eliquis for clot prevention HTN- BUN/creat 20/ 1.04 09/19/2020, remains on metoprolol  Hx CVA- 11/2019 MRI brain revealed embolism of right posterior cerebral artery, 09/2020 CT head revealed chronic cerebral infarcts and advanced bilateral temporal lobe atrophy Alzheimer's- no recent behavioral outbursts, BIMS score 4 12/2020, MMSE 12/30, remains on  aricept. Today, she can follow simple commands during exam. She is more talkative this encounter, will answer with short phrases.   No recent falls or injuries. Ambulates with walker.   Recent blood pressures:  10/03- 138/68  10/02- 132/65  10/01- 120/66  Recent weights:  09/01- 180 lbs  08/02- 175 lbs  07/04- 180 lbs  3rd covid booster given 09/21, tolerated well.   Past Medical History:  Diagnosis Date   Alzheimer disease [facility])    Atrial fibrillation (HCC)    Hypertension    History reviewed. No pertinent surgical history.  Allergies  Allergen Reactions   Bee Venom Other (See Comments)    Listed on MAR    Allergies as of 03/15/2021       Reactions   Bee Venom Other (See Comments)   Listed on [facility]        Medication List        Accurate as of March 15, 2021 11:45 AM. If you have any questions, ask your nurse or doctor.          apixaban 5 MG Tabs tablet Commonly known as: ELIQUIS Take 5 mg by mouth 2 (two) times daily.   bimatoprost 0.01 % Soln Commonly known as: LUMIGAN Place 1 drop into both eyes at bedtime.   CALCIUM 600 + D PO Take 1 tablet by mouth in the morning and at bedtime.   CENTRUM SILVER PO Take 1 tablet by mouth daily.   donepezil 5 MG tablet Commonly known as: ARICEPT Take 5 mg by mouth at bedtime.   furosemide 20 MG  tablet Commonly known as: LASIX Take 20 mg by mouth daily.   Metoprolol Succinate 50 MG Cs24 Take 50 mg by mouth daily. Hold if Systolic is < 95 or if pulse is less than 60   zinc oxide 20 % ointment Apply 1 application topically as needed for irritation. Apply to buttocks after every incontinent episode and as needed for redness        Review of Systems  Unable to perform ROS: Dementia   Immunization History  Administered Date(s) Administered   Influenza-Unspecified 04/02/2014, 04/01/2016, 04/14/2017, 03/17/2020   Moderna SARS-COV2 Booster Vaccination 11/18/2020   Moderna Sars-Covid-2 Vaccination  06/17/2019, 07/15/2019, 04/27/2020   Pneumococcal Conjugate-13 03/24/2014   Pneumococcal Polysaccharide-23 02/26/2009   Tdap 03/08/2012, 12/23/2018   Zoster, Live 09/03/2007   Pertinent  Health Maintenance Due  Topic Date Due   URINE MICROALBUMIN  Never done   INFLUENZA VACCINE  01/11/2021   DEXA SCAN  10/26/2021 (Originally 07/18/1994)   Fall Risk  12/11/2020  Falls in the past year? 1  Number falls in past yr: 1  Injury with Fall? 0  Risk for fall due to : History of fall(s);Impaired balance/gait;Impaired mobility;Mental status change  Follow up Falls evaluation completed;Education provided;Falls prevention discussed   Functional Status Survey:    Vitals:   03/15/21 1121  BP: 138/68  Pulse: 74  Resp: 20  Temp: (!) 97.4 F (36.3 C)  SpO2: 95%  Weight: 180 lb (81.6 kg)  Height: 5\' 1"  (1.549 m)   Body mass index is 34.01 kg/m. Physical Exam Vitals reviewed.  Constitutional:      General: She is not in acute distress. HENT:     Head: Normocephalic.     Right Ear: There is no impacted cerumen.     Left Ear: There is no impacted cerumen.     Nose: Nose normal.     Mouth/Throat:     Mouth: Mucous membranes are moist.  Eyes:     General:        Right eye: No discharge.        Left eye: No discharge.  Neck:     Vascular: No carotid bruit.  Cardiovascular:     Rate and Rhythm: Normal rate. Rhythm irregular.     Pulses:          Dorsalis pedis pulses are 1+ on the right side and 1+ on the left side.     Heart sounds: Normal heart sounds. No murmur heard. Pulmonary:     Effort: Pulmonary effort is normal. No respiratory distress.     Breath sounds: Normal breath sounds. No wheezing.  Abdominal:     General: Bowel sounds are normal. There is no distension.     Palpations: Abdomen is soft.     Tenderness: There is no abdominal tenderness.  Musculoskeletal:     Cervical back: Normal range of motion.     Right lower leg: Edema present.     Left lower leg: Edema  present.     Comments: Non-pitting  Feet:     Right foot:     Skin integrity: Skin integrity normal.     Toenail Condition: Right toenails are normal.     Left foot:     Skin integrity: Skin integrity normal.     Toenail Condition: Left toenails are normal.     Comments: Toes mottled, blanchable, feel cool to touch.  Lymphadenopathy:     Cervical: No cervical adenopathy.  Skin:    General: Skin is  warm and dry.  Neurological:     Mental Status: She is alert.    Labs reviewed: Recent Labs    05/28/20 0000 09/07/20 0000 09/19/20 1003  NA 140 139 135  K 4.3 4.4 4.1  CL 103 101 99  CO2 28* 27* 29  GLUCOSE  --   --  139*  BUN 26* 24* 20  CREATININE 1.2* 1.1 1.04*  CALCIUM 9.0 9.5 9.3   Recent Labs    09/07/20 0000  AST 17  ALT 13  ALKPHOS 98  ALBUMIN 3.9   Recent Labs    05/28/20 0000 09/07/20 0000 09/19/20 1003  WBC 5.9 9.6 12.2*  NEUTROABS  --  6,134.00  --   HGB 9.4* 12.3 12.9  HCT 29* 38 39.6  MCV  --   --  92.3  PLT 229 264 286   Lab Results  Component Value Date   TSH 1.75 11/25/2019   Lab Results  Component Value Date   HGBA1C 5.3 11/13/2019   Lab Results  Component Value Date   CHOL 89 02/27/2020   HDL 46 02/27/2020   LDLCALC 30 02/27/2020   TRIG 51 02/27/2020   CHOLHDL 2.2 11/13/2019    Significant Diagnostic Results in last 30 days:  No results found.  Assessment/Plan 1. Paroxysmal atrial fibrillation (HCC) - rate controlled with metoprolol - cont eliquis for clot prevention  2. Essential hypertension - BUN/creat 20/1.04 09/2020 - controlled with metoprolol - cbc/diff - cmp  3. Cerebrovascular accident (CVA) due to embolism of right posterior cerebral artery (HCC) - 11/2019 MRI brain revealed embolism of right posterior cerebral artery - 09/2020 CT head revealed chronic cerebral infarcts and advanced bilateral temporal lobe atrophy  4. Alzheimer disease (HCC) - no recent behavioral outbursts - cont aricept - con skilled  nursing care  5. Edema, unspecified - nonpitting edema present - cont lasix  6. Mixed hyperlipidemia - LDL 30 02/2020 - lipid panel  Family/ staff Communication: plan discussed with nurse  Labs/tests ordered:  cbc/diff, cmp, lipid panel, TSH

## 2021-03-18 LAB — BASIC METABOLIC PANEL
BUN: 29 — AB (ref 4–21)
CO2: 26 — AB (ref 13–22)
Chloride: 102 (ref 99–108)
Creatinine: 1 (ref 0.5–1.1)
Glucose: 93
Potassium: 4.7 (ref 3.4–5.3)
Sodium: 138 (ref 137–147)

## 2021-03-18 LAB — HEPATIC FUNCTION PANEL
ALT: 13 (ref 7–35)
AST: 16 (ref 13–35)
Alkaline Phosphatase: 91 (ref 25–125)
Bilirubin, Total: 0.5

## 2021-03-18 LAB — CBC AND DIFFERENTIAL
HCT: 34 — AB (ref 36–46)
Hemoglobin: 11.1 — AB (ref 12.0–16.0)
Neutrophils Absolute: 5609
Platelets: 250 (ref 150–399)
WBC: 8.2

## 2021-03-18 LAB — TSH: TSH: 2.35 (ref 0.41–5.90)

## 2021-03-18 LAB — CBC: RBC: 3.74 — AB (ref 3.87–5.11)

## 2021-03-18 LAB — COMPREHENSIVE METABOLIC PANEL
Albumin: 3.5 (ref 3.5–5.0)
Calcium: 9 (ref 8.7–10.7)
Globulin: 2.2

## 2021-04-22 ENCOUNTER — Encounter: Payer: Self-pay | Admitting: Internal Medicine

## 2021-04-22 ENCOUNTER — Non-Acute Institutional Stay (SKILLED_NURSING_FACILITY): Payer: Medicare Other | Admitting: Internal Medicine

## 2021-04-22 DIAGNOSIS — I63431 Cerebral infarction due to embolism of right posterior cerebral artery: Secondary | ICD-10-CM | POA: Diagnosis not present

## 2021-04-22 DIAGNOSIS — I480 Paroxysmal atrial fibrillation: Secondary | ICD-10-CM

## 2021-04-22 DIAGNOSIS — G309 Alzheimer's disease, unspecified: Secondary | ICD-10-CM | POA: Diagnosis not present

## 2021-04-22 DIAGNOSIS — F0280 Dementia in other diseases classified elsewhere without behavioral disturbance: Secondary | ICD-10-CM

## 2021-04-22 DIAGNOSIS — I10 Essential (primary) hypertension: Secondary | ICD-10-CM

## 2021-04-22 DIAGNOSIS — R609 Edema, unspecified: Secondary | ICD-10-CM | POA: Diagnosis not present

## 2021-04-22 DIAGNOSIS — E782 Mixed hyperlipidemia: Secondary | ICD-10-CM

## 2021-04-22 DIAGNOSIS — N1831 Chronic kidney disease, stage 3a: Secondary | ICD-10-CM

## 2021-04-22 NOTE — Progress Notes (Signed)
Location:   [facility] Room Number: 7 Place of Service:  SNF [phone]) Provider:  Veleta Miners MD  Virgie Dad, MD  Patient Care Team: Virgie Dad, MD as PCP - General (Internal Medicine)  Extended Emergency Contact Information Primary Emergency Contact: [name] Address: [address]          [place], [place] [address] Johnnette Litter of [place] Phone: [phone] Relation: Son  Code Status:  DNR Goals of care: Advanced Directive information Advanced Directives 04/22/2021  Does Patient Have a Medical Advance Directive? Yes  Type of Paramedic of [place];Living will;Out of facility DNR (pink MOST or yellow form)  Does patient want to make changes to medical advance directive? No - Patient declined  Copy of [place] in Chart? Yes - validated most recent copy scanned in chart (See row information)  Would patient like information on creating a medical advance directive? -  Pre-existing out of facility DNR order (yellow form or pink MOST form) Yellow form placed in chart (order not valid for inpatient use)     Chief Complaint  Patient presents with   Medical Management of Chronic Issues   Quality Metric Gaps    Urine microalbumin    HPI:  Pt is a 87 y.o. female seen today for medical management of chronic diseases.    Long term resident of SNF H/o  right PCA infarct.  Has been on Eliquis since then.   Also has a history of Alzheimer's dementia, hypertension, A. fib.  Her echo had a EF of 60 to 65% with LA dilated CT scan showed Temporal Atrophy and Chronic Cerebral Infarcts  Has been stable in SNF Daughter does not want aggressive measures Weight stays stable Walks with her walker and mid assist No Falls Mood is stable Has mild aphasia  Past Medical History:  Diagnosis Date   Alzheimer disease ([place])    Atrial fibrillation ([place])    Hypertension    History reviewed. No pertinent  surgical history.  Allergies  Allergen Reactions   Bee Venom Other (See Comments)    Listed on MAR    Allergies as of 04/22/2021       Reactions   Bee Venom Other (See Comments)   Listed on [facility]        Medication List        Accurate as of April 22, 2021 11:39 AM. If you have any questions, ask your nurse or doctor.          apixaban 5 MG Tabs tablet Commonly known as: ELIQUIS Take 5 mg by mouth 2 (two) times daily.   bimatoprost 0.01 % Soln Commonly known as: LUMIGAN Place 1 drop into both eyes at bedtime.   CALCIUM 600 + D PO Take 1 tablet by mouth in the morning and at bedtime.   CENTRUM SILVER PO Take 1 tablet by mouth daily.   donepezil 5 MG tablet Commonly known as: ARICEPT Take 5 mg by mouth at bedtime.   furosemide 20 MG tablet Commonly known as: LASIX Take 20 mg by mouth daily.   Metoprolol Succinate 50 MG Cs24 Take 50 mg by mouth daily. Hold if Systolic is < 95 or if pulse is less than 60   zinc oxide 20 % ointment Apply 1 application topically as needed for irritation. Apply to buttocks after every incontinent episode and as needed for redness        Review of Systems  Unable to perform ROS: Dementia   Immunization History  Administered Date(s) Administered   Influenza-Unspecified 04/02/2014, 04/01/2016, 04/14/2017, 03/17/2020, 04/07/2021   Moderna SARS-COV2 Booster Vaccination 11/18/2020   Moderna Sars-Covid-2 Vaccination 06/17/2019, 07/15/2019, 04/27/2020   PFIZER(Purple Top)SARS-COV-2 Vaccination 03/03/2021   Pneumococcal Conjugate-13 03/24/2014   Pneumococcal Polysaccharide-23 02/26/2009   Tdap 03/08/2012, 12/23/2018   Zoster, Live 09/03/2007   Pertinent  Health Maintenance Due  Topic Date Due   URINE MICROALBUMIN  Never done   DEXA SCAN  10/26/2021 (Originally 07/18/1994)   INFLUENZA VACCINE  Completed   Fall Risk 11/14/2019 11/14/2019 11/15/2019 11/16/2019 12/11/2020  Falls in the past year? - - - - 1  Was there an injury with  Fall? - - - - 0  Fall Risk Category Calculator - - - - 2  Fall Risk Category - - - - Moderate  Patient Fall Risk Level High fall risk High fall risk High fall risk High fall risk High fall risk  Patient at Risk for Falls Due to - - - - History of fall(s);Impaired balance/gait;Impaired mobility;Mental status change  Fall risk Follow up - - - - Falls evaluation completed;Education provided;Falls prevention discussed   Functional Status Survey:    Vitals:   04/22/21 1133  BP: (!) 156/72  Pulse: 70  Resp: (!) 24  Temp: (!) 97.1 F (36.2 C)  SpO2: 93%  Weight: 179 lb (81.2 kg)  Height: 5\' 1"  (1.549 m)   Body mass index is 33.82 kg/m. Physical Exam Constitutional: Well-developed and well-nourished.  HENT:  Head: Normocephalic.  Mouth/Throat: Oropharynx is clear and moist.  Eyes: Pupils are equal, round, and reactive to light.  Neck: Neck supple.  Cardiovascular: Normal rate and normal heart sounds.  No murmur heard. Pulmonary/Chest: Effort normal and breath sounds normal. No respiratory distress. No wheezes. She has no rales.  Abdominal: Soft. Bowel sounds are normal. No distension. There is no tenderness. There is no rebound.  Musculoskeletal: Mild edema Bilateral Lymphadenopathy: none Neurological: Alert Walks with her walker Mild aphasia  Skin: Skin is warm and dry.  Psychiatric: Normal mood and affect. Behavior is normal. Thought content normal.    Labs reviewed: Recent Labs    09/07/20 0000 09/19/20 1003 03/18/21 0000  NA 139 135 138  K 4.4 4.1 4.7  CL 101 99 102  CO2 27* 29 26*  GLUCOSE  --  139*  --   BUN 24* 20 29*  CREATININE 1.1 1.04* 1.0  CALCIUM 9.5 9.3 9.0   Recent Labs    09/07/20 0000 03/18/21 0000  AST 17 16  ALT 13 13  ALKPHOS 98 91  ALBUMIN 3.9 3.5   Recent Labs    09/07/20 0000 09/19/20 1003 03/18/21 0000  WBC 9.6 12.2* 8.2  NEUTROABS 6,134.00  --  5,609.00  HGB 12.3 12.9 11.1*  HCT 38 39.6 34*  MCV  --  92.3  --   PLT 264 286  250   Lab Results  Component Value Date   TSH 2.35 03/18/2021   Lab Results  Component Value Date   HGBA1C 5.3 11/13/2019   Lab Results  Component Value Date   CHOL 89 02/27/2020   HDL 46 02/27/2020   LDLCALC 30 02/27/2020   TRIG 51 02/27/2020   CHOLHDL 2.2 11/13/2019    Significant Diagnostic Results in last 30 days:  No results found.  Assessment/Plan Paroxysmal atrial fibrillation (HCC) On Elquis Cerebrovascular accident (CVA) due to embolism of right posterior cerebral artery ([place]) Continue on Eliquis Off statins  due to her family request Alzheimer disease (HCC) Continue on Low dose of Aricept Staying stable on  Edema, unspecified type On low dose of lasix Stage 3a chronic kidney disease (HCC) Creat stable Essential hypertension On Metoprolol   Family/ staff Communication:   Labs/tests ordered:

## 2021-05-25 ENCOUNTER — Encounter: Payer: Self-pay | Admitting: Nurse Practitioner

## 2021-05-25 ENCOUNTER — Non-Acute Institutional Stay (SKILLED_NURSING_FACILITY): Payer: Medicare Other | Admitting: Nurse Practitioner

## 2021-05-25 DIAGNOSIS — E782 Mixed hyperlipidemia: Secondary | ICD-10-CM

## 2021-05-25 DIAGNOSIS — I480 Paroxysmal atrial fibrillation: Secondary | ICD-10-CM | POA: Diagnosis not present

## 2021-05-25 DIAGNOSIS — F0280 Dementia in other diseases classified elsewhere without behavioral disturbance: Secondary | ICD-10-CM

## 2021-05-25 DIAGNOSIS — I63431 Cerebral infarction due to embolism of right posterior cerebral artery: Secondary | ICD-10-CM

## 2021-05-25 DIAGNOSIS — I10 Essential (primary) hypertension: Secondary | ICD-10-CM

## 2021-05-25 DIAGNOSIS — N183 Anemia in chronic kidney disease: Secondary | ICD-10-CM

## 2021-05-25 DIAGNOSIS — G309 Alzheimer's disease, unspecified: Secondary | ICD-10-CM

## 2021-05-25 DIAGNOSIS — R609 Edema, unspecified: Secondary | ICD-10-CM

## 2021-05-25 DIAGNOSIS — N1832 Chronic kidney disease, stage 3b: Secondary | ICD-10-CM

## 2021-05-25 DIAGNOSIS — D631 Anemia in chronic kidney disease: Secondary | ICD-10-CM

## 2021-05-25 NOTE — Assessment & Plan Note (Signed)
heart rate is in control, takes Metoprolol, Eliquis.

## 2021-05-25 NOTE — Assessment & Plan Note (Signed)
Vit 12 475, Ferritin 49, Iron 51 05/28/20, Hgb 11.1 03/18/21

## 2021-05-25 NOTE — Assessment & Plan Note (Signed)
LDL 30 02/2020, off statin per [facility] request

## 2021-05-25 NOTE — Progress Notes (Signed)
Location:   [facility] Room Number: 7 A Place of Service:  SNF (31) Provider: [facility] Assunta Pupo NP  Mahlon Gammon, MD  Patient Care Team: Mahlon Gammon, MD as PCP - General (Internal Medicine)  Extended Emergency Contact Information Primary Emergency Contact: Marval Regal Address: [address]          [place], [place] [address] Darden Amber of [place] Home Phone: [phone] Relation: Son  Code Status:  DNR Goals of care: Advanced Directive information Advanced Directives 05/25/2021  Does Patient Have a Medical Advance Directive? Yes  Type of Estate agent of [place];Living will;Out of facility DNR (pink MOST or yellow form)  Does patient want to make changes to medical advance directive? No - Patient declined  Copy of Healthcare Power of Attorney in Chart? Yes - validated most recent copy scanned in chart (See row information)  Would patient like information on creating a medical advance directive? -  Pre-existing out of facility DNR order (yellow form or pink MOST form) Yellow form placed in chart (order not valid for inpatient use)     Chief Complaint  Patient presents with   Medical Management of Chronic Issues    Routine visit   Quality Metric Gaps    URINE MICROALBUMIN, COVID #5     HPI:  Pt is a 87 y.o. female seen today for medical management of chronic diseases.     Hx of Alzheimer's dementia, on Donepezil 5mg  qd.  MMSE 12/30 05/06/20, TSH 2.35 03/18/21             CKD, Bun/creat 26/1.0 03/18/21             Afib, heart rate is in control, takes Metoprolol, Eliquis.              Hyperlipidemia, LDL 30 02/2020, off statin per HPOA request             BLE edema, takes Furosemide.              Anemia, Vit 12 475, Ferritin 49, Iron 51 05/28/20, Hgb 11.1 03/18/21             CVA R posterior cerebral artery, off Statin-HPOA requested, 11/2019 left side weakness.    Past Medical History:  Diagnosis Date   Alzheimer disease [facility])     Atrial fibrillation (HCC)    Hypertension    History reviewed. No pertinent surgical history.  Allergies  Allergen Reactions   Bee Venom Other (See Comments)    Listed on MAR    Allergies as of 05/25/2021       Reactions   Bee Venom Other (See Comments)   Listed on [facility]        Medication List        Accurate as of May 25, 2021 11:59 PM. If you have any questions, ask your nurse or doctor.          apixaban 5 MG Tabs tablet Commonly known as: ELIQUIS Take 5 mg by mouth 2 (two) times daily.   bimatoprost 0.01 % Soln Commonly known as: LUMIGAN Place 1 drop into both eyes at bedtime.   CALCIUM 600 + D PO Take 1 tablet by mouth in the morning and at bedtime.   CENTRUM SILVER PO Take 1 tablet by mouth daily.   donepezil 5 MG tablet Commonly known as: ARICEPT Take 5 mg by mouth at bedtime.   furosemide 20 MG tablet Commonly known as: LASIX Take 20  mg by mouth daily.   Metoprolol Succinate 50 MG Cs24 Take 50 mg by mouth daily. Hold if Systolic is < 95 or if pulse is less than 60   zinc oxide 20 % ointment Apply 1 application topically as needed for irritation. Apply to buttocks after every incontinent episode and as needed for redness        Review of Systems  Unable to perform ROS: Dementia   Immunization History  Administered Date(s) Administered   Influenza-Unspecified 04/02/2014, 04/01/2016, 04/14/2017, 03/17/2020, 04/07/2021   Moderna SARS-COV2 Booster Vaccination 11/18/2020   Moderna Sars-Covid-2 Vaccination 06/17/2019, 07/15/2019, 04/27/2020   PFIZER(Purple Top)SARS-COV-2 Vaccination 03/03/2021   Pneumococcal Conjugate-13 03/24/2014   Pneumococcal Polysaccharide-23 02/26/2009   Tdap 03/08/2012, 12/23/2018   Zoster, Live 09/03/2007   Pertinent  Health Maintenance Due  Topic Date Due   URINE MICROALBUMIN  Never done   DEXA SCAN  10/26/2021 (Originally 07/18/1994)   INFLUENZA VACCINE  Completed   Fall Risk 11/14/2019 11/14/2019 11/15/2019  11/16/2019 12/11/2020  Falls in the past year? - - - - 1  Was there an injury with Fall? - - - - 0  Fall Risk Category Calculator - - - - 2  Fall Risk Category - - - - Moderate  Patient Fall Risk Level High fall risk High fall risk High fall risk High fall risk High fall risk  Patient at Risk for Falls Due to - - - - History of fall(s);Impaired balance/gait;Impaired mobility;Mental status change  Fall risk Follow up - - - - Falls evaluation completed;Education provided;Falls prevention discussed   Functional Status Survey:    Vitals:   05/25/21 1210  BP: (!) 164/70  Pulse: 64  Resp: (!) 22  Temp: (!) 97.2 F (36.2 C)  SpO2: 91%  Weight: 176 lb (79.8 kg)  Height: 5\' 1"  (1.549 m)   Body mass index is 33.25 kg/m. Physical Exam Vitals and nursing note reviewed.  Constitutional:      Appearance: Normal appearance.  HENT:     Head: Normocephalic and atraumatic.  Eyes:     Extraocular Movements: Extraocular movements intact.     Conjunctiva/sclera: Conjunctivae normal.     Pupils: Pupils are equal, round, and reactive to light.  Cardiovascular:     Rate and Rhythm: Normal rate. Rhythm irregular.     Heart sounds: No murmur heard. Pulmonary:     Effort: Pulmonary effort is normal.     Breath sounds: No rhonchi or rales.  Abdominal:     General: Bowel sounds are normal.     Palpations: Abdomen is soft.     Tenderness: There is no abdominal tenderness.  Musculoskeletal:     Cervical back: Normal range of motion and neck supple.     Right lower leg: Edema present.     Left lower leg: Edema present.     Comments: Trace  edema BLE  Skin:    General: Skin is warm and dry.  Neurological:     Mental Status: She is alert. Mental status is at baseline.     Motor: Weakness present.     Gait: Gait abnormal.     Comments: Oriented to person and place.  Left sided weakness with muscle strength 5/5. Ambulates with walker.   Psychiatric:        Mood and Affect: Mood normal.         Behavior: Behavior normal.    Labs reviewed: Recent Labs    09/07/20 0000 09/19/20 1003 03/18/21 0000  NA 139  135 138  K 4.4 4.1 4.7  CL 101 99 102  CO2 27* 29 26*  GLUCOSE  --  139*  --   BUN 24* 20 29*  CREATININE 1.1 1.04* 1.0  CALCIUM 9.5 9.3 9.0   Recent Labs    09/07/20 0000 03/18/21 0000  AST 17 16  ALT 13 13  ALKPHOS 98 91  ALBUMIN 3.9 3.5   Recent Labs    09/07/20 0000 09/19/20 1003 03/18/21 0000  WBC 9.6 12.2* 8.2  NEUTROABS 6,134.00  --  5,609.00  HGB 12.3 12.9 11.1*  HCT 38 39.6 34*  MCV  --  92.3  --   PLT 264 286 250   Lab Results  Component Value Date   TSH 2.35 03/18/2021   Lab Results  Component Value Date   HGBA1C 5.3 11/13/2019   Lab Results  Component Value Date   CHOL 89 02/27/2020   HDL 46 02/27/2020   LDLCALC 30 02/27/2020   TRIG 51 02/27/2020   CHOLHDL 2.2 11/13/2019    Significant Diagnostic Results in last 30 days:  No results found.  Assessment/Plan CKD (chronic kidney disease) stage 3, GFR 30-59 ml/min (HCC) Stable, Bun/creat 26/1.0 03/18/21  Atrial fibrillation (HCC) heart rate is in control, takes Metoprolol, Eliquis.   Hyperlipidemia  LDL 30 02/2020, off statin per HPOA request  Edema Mild swelling BLE, takes Furosemide.   Anemia due to chronic kidney disease Vit 12 475, Ferritin 49, Iron 51 05/28/20, Hgb 11.1 03/18/21  Stroke [facility]) CVA R posterior cerebral artery, off Statin-HPOA requested, 11/2019 left side weakness.   Alzheimer disease ([place]) x of Alzheimer's dementia, on Donepezil 5mg  qd.  MMSE 12/30 05/06/20, TSH 2.35 03/18/21  Hypertension Sbp elevated, on Metoprolol, Furosemide, the patient is in her usual state of health, will continue to monitor Bp   Family/ staff Communication: plan of care reviewed with the patient and charge nurse.   Labs/tests ordered:  none  Time spend 25 minutes.

## 2021-05-25 NOTE — Assessment & Plan Note (Signed)
Mild swelling BLE, takes Furosemide.

## 2021-05-25 NOTE — Assessment & Plan Note (Signed)
CVA R posterior cerebral artery, off Statin-HPOA requested, 11/2019 left side weakness.

## 2021-05-25 NOTE — Assessment & Plan Note (Signed)
Stable, Bun/creat 26/1.0 03/18/21

## 2021-05-25 NOTE — Assessment & Plan Note (Signed)
x of Alzheimer's dementia, on Donepezil 5mg  qd.  MMSE 12/30 05/06/20, TSH 2.35 03/18/21

## 2021-05-28 ENCOUNTER — Encounter: Payer: Self-pay | Admitting: Nurse Practitioner

## 2021-05-28 NOTE — Assessment & Plan Note (Signed)
Sbp elevated, on Metoprolol, Furosemide, the patient is in her usual state of health, will continue to monitor Bp

## 2021-06-23 ENCOUNTER — Non-Acute Institutional Stay (SKILLED_NURSING_FACILITY): Payer: Medicare Other | Admitting: Orthopedic Surgery

## 2021-06-23 ENCOUNTER — Encounter: Payer: Self-pay | Admitting: Orthopedic Surgery

## 2021-06-23 DIAGNOSIS — F0280 Dementia in other diseases classified elsewhere without behavioral disturbance: Secondary | ICD-10-CM

## 2021-06-23 DIAGNOSIS — D631 Anemia in chronic kidney disease: Secondary | ICD-10-CM

## 2021-06-23 DIAGNOSIS — I480 Paroxysmal atrial fibrillation: Secondary | ICD-10-CM

## 2021-06-23 DIAGNOSIS — E782 Mixed hyperlipidemia: Secondary | ICD-10-CM | POA: Diagnosis not present

## 2021-06-23 DIAGNOSIS — R609 Edema, unspecified: Secondary | ICD-10-CM

## 2021-06-23 DIAGNOSIS — G309 Alzheimer's disease, unspecified: Secondary | ICD-10-CM | POA: Diagnosis not present

## 2021-06-23 DIAGNOSIS — N1832 Chronic kidney disease, stage 3b: Secondary | ICD-10-CM

## 2021-06-23 DIAGNOSIS — I63431 Cerebral infarction due to embolism of right posterior cerebral artery: Secondary | ICD-10-CM

## 2021-06-23 DIAGNOSIS — N183 Chronic kidney disease, stage 3 unspecified: Secondary | ICD-10-CM

## 2021-06-23 DIAGNOSIS — R634 Abnormal weight loss: Secondary | ICD-10-CM

## 2021-06-23 NOTE — Progress Notes (Signed)
Location:   [facility] Room Number: 7 A Place of Service:  SNF (31) Provider:  Windell Moulding, NP  Virgie Dad, MD  Patient Care Team: Virgie Dad, MD as PCP - General (Internal Medicine)  Extended Emergency Contact Information Primary Emergency Contact: [name] Address: [address]          [place], [place] [address] Johnnette Litter of [place] Phone: [phone] Relation: Son  Code Status:  DNR Goals of care: Advanced Directive information Advanced Directives 06/23/2021  Does Patient Have a Medical Advance Directive? Yes  Type of Paramedic of [place];Living will;Out of facility DNR (pink MOST or yellow form)  Does patient want to make changes to medical advance directive? No - Patient declined  Copy of [place] in Chart? Yes - validated most recent copy scanned in chart (See row information)  Would patient like information on creating a medical advance directive? -  Pre-existing out of facility DNR order (yellow form or pink MOST form) Pink MOST form placed in chart (order not valid for inpatient use);Yellow form placed in chart (order not valid for inpatient use)     Chief Complaint  Patient presents with   Medical Management of Chronic Issues    Routine follow up visit.   Health Maintenance    Urine microalbumin, 3rd COVID booster    HPI:  Pt is a 88 y.o. female seen today for medical management of chronic diseases.    She continues to reside on the skilled nursing [facility] at [facility]. Past medical history includes: CVA, hypertension, atrial fibrillation, Alzheimer's, CKD stage 3, hyperlipidemia and anemia related to CKD.   Alzheimer's- no behavioral outbursts, MMSE 12/30 2021, less talkative today, gave simple "yes" and "no" replies, remains on Aricept Afib- HR controlled with metoprolol, remains on Eliquis for clot prevention CKD- BUN/creat 29/1.0 03/18/2021 HLD- LDL 30  02/2020, off statin per HPOA BLE- no increased edema, remains on lasix daily Anemia- hgb 11.1 03/18/2021, ferritin 49, iron 51, B12 475 05/2020 Hx CVA- MRI brain 11/2019 revealed acute right PCA territory infarct with petechial hemorrhage, left sided weakness, off statin at this time per HPOA Weight loss- down 10 lbs from October 2022  No recent falls or injuries. Ambulates in walker.   Recent blood pressures:  01/11- 158/82  01/10- 143/64, 131/54  01/09- 136/76  Recent weights:  01/06- 171 lbs  12/07- 170 lbs  11/02- 179 lbs  10/05- 180 lbs      Past Medical History:  Diagnosis Date   Alzheimer disease ([place])    Atrial fibrillation ([place])    Hypertension    History reviewed. No pertinent surgical history.  Allergies  Allergen Reactions   Bee Venom Other (See Comments)    Listed on MAR    Allergies as of 06/23/2021       Reactions   Bee Venom Other (See Comments)   Listed on [facility]        Medication List        Accurate as of June 23, 2021  2:11 PM. If you have any questions, ask your nurse or doctor.          apixaban 5 MG Tabs tablet Commonly known as: ELIQUIS Take 5 mg by mouth 2 (two) times daily.   bimatoprost 0.01 % Soln Commonly known as: LUMIGAN Place 1 drop into both eyes at bedtime.   CALCIUM 600 + D PO Take 1 tablet by mouth  in the morning and at bedtime.   CENTRUM SILVER PO Take 1 tablet by mouth daily.   donepezil 5 MG tablet Commonly known as: ARICEPT Take 5 mg by mouth at bedtime.   furosemide 20 MG tablet Commonly known as: LASIX Take 20 mg by mouth daily.   Metoprolol Succinate 50 MG Cs24 Take 50 mg by mouth daily. Hold if Systolic is < 95 or if pulse is less than 60   zinc oxide 20 % ointment Apply 1 application topically as needed for irritation. Apply to buttocks after every incontinent episode and as needed for redness        Review of Systems  Unable to perform ROS: Dementia   Immunization History   Administered Date(s) Administered   Influenza-Unspecified 04/02/2014, 04/01/2016, 04/14/2017, 03/17/2020, 04/07/2021   Moderna SARS-COV2 Booster Vaccination 11/18/2020   Moderna Sars-Covid-2 Vaccination 06/17/2019, 07/15/2019, 04/27/2020   PFIZER(Purple Top)SARS-COV-2 Vaccination 03/03/2021   Pneumococcal Conjugate-13 03/24/2014   Pneumococcal Polysaccharide-23 02/26/2009   Tdap 03/08/2012, 12/23/2018   Zoster, Live 09/03/2007   Pertinent  Health Maintenance Due  Topic Date Due   URINE MICROALBUMIN  Never done   DEXA SCAN  10/26/2021 (Originally 07[birth year]18/1994)   INFLUENZA VACCINE  Completed   Fall Risk 11/14/2019 11/14/2019 11/15/2019 11/16/2019 12/11/2020  Falls in the past year? - - - - 1  Was there an injury with Fall? - - - - 0  Fall Risk Category Calculator - - - - 2  Fall Risk Category - - - - Moderate  Patient Fall Risk Level High fall risk High fall risk High fall risk High fall risk High fall risk  Patient at Risk for Falls Due to - - - - History of fall(s);Impaired balance/gait;Impaired mobility;Mental status change  Fall risk Follow up - - - - Falls evaluation completed;Education provided;Falls prevention discussed   Functional Status Survey:    Vitals:   06/23/21 1358  BP: (!) 158/82  Pulse: 74  Temp: (!) 97.5 F (36.4 C)  SpO2: 93%  Weight: 171 lb (77.6 kg)  Height: 5\' 1"  (1.549 m)   Body mass index is 32.31 kg/m. Physical Exam Vitals reviewed.  Constitutional:      General: She is not in acute distress. HENT:     Head: Normocephalic.     Right Ear: There is no impacted cerumen.     Left Ear: There is no impacted cerumen.     Nose: Nose normal.     Mouth/Throat:     Mouth: Mucous membranes are moist.  Eyes:     General:        Right eye: No discharge.        Left eye: No discharge.  Neck:     Vascular: No carotid bruit.  Cardiovascular:     Rate and Rhythm: Normal rate and regular rhythm.     Pulses: Normal pulses.     Heart sounds: Normal heart sounds. No  murmur heard. Pulmonary:     Effort: Pulmonary effort is normal. No respiratory distress.     Breath sounds: Normal breath sounds. No wheezing.  Abdominal:     General: Bowel sounds are normal. There is no distension.     Palpations: Abdomen is soft.     Tenderness: There is no abdominal tenderness.  Musculoskeletal:     Cervical back: Normal range of motion.     Right lower leg: Edema present.     Left lower leg: Edema present.     Comments: Non pitting  Lymphadenopathy:     Cervical: No cervical adenopathy.  Skin:    General: Skin is warm and dry.     Capillary Refill: Capillary refill takes less than 2 seconds.  Neurological:     General: No focal deficit present.     Mental Status: She is alert. Mental status is at baseline.     Motor: Weakness present.     Gait: Gait abnormal.     Comments: Walker/wheelchair, left hand grip 4/5, right hand grip 5/5  Psychiatric:        Mood and Affect: Mood normal.        Behavior: Behavior normal.        Cognition and Memory: Memory is impaired.     Comments: Alert to self, follows commands, "yes/no" answers only    Labs reviewed: Recent Labs    09/07/20 0000 09/19/20 1003 03/18/21 0000  NA 139 135 138  K 4.4 4.1 4.7  CL 101 99 102  CO2 27* 29 26*  GLUCOSE  --  139*  --   BUN 24* 20 29*  CREATININE 1.1 1.04* 1.0  CALCIUM 9.5 9.3 9.0   Recent Labs    09/07/20 0000 03/18/21 0000  AST 17 16  ALT 13 13  ALKPHOS 98 91  ALBUMIN 3.9 3.5   Recent Labs    09/07/20 0000 09/19/20 1003 03/18/21 0000  WBC 9.6 12.2* 8.2  NEUTROABS 6,134.00  --  5,609.00  HGB 12.3 12.9 11.1*  HCT 38 39.6 34*  MCV  --  92.3  --   PLT 264 286 250   Lab Results  Component Value Date   TSH 2.35 03/18/2021   Lab Results  Component Value Date   HGBA1C 5.3 11/13/2019   Lab Results  Component Value Date   CHOL 89 02/27/2020   HDL 46 02/27/2020   LDLCALC 30 02/27/2020   TRIG 51 02/27/2020   CHOLHDL 2.2 11/13/2019    Significant  Diagnostic Results in last 30 days:  No results found.  Assessment/Plan 1. Alzheimer disease ([place]) - no behavioral outbursts - cont skilled nursing care - cont donepezil  2. Paroxysmal atrial fibrillation (HCC) - HR stable with metoprolol - cont Eliquis for clot prevention  3. Stage 3b chronic kidney disease ([place]) - continue to avoid nephrotoxic drugs like NSAIDS and dose adjust medications to be renally stable  4. Mixed hyperlipidemia - off statin per HPOA  5. Edema, unspecified type - non pitting - suspect from decreased mobility  6. Anemia due to stage 3 chronic kidney disease, unspecified whether stage 3a or 3b CKD (HCC) - hgb stable  7. Cerebrovascular accident (CVA) due to embolism of right posterior cerebral artery (HCC) - off statin per HPOA  8. Weight loss - down 10 lbs from 03/2021 - cont monthly weights     Family/ staff Communication: plan discussed with patient and nurse  Labs/tests ordered:  none

## 2021-07-22 ENCOUNTER — Non-Acute Institutional Stay (SKILLED_NURSING_FACILITY): Payer: Medicare Other | Admitting: Internal Medicine

## 2021-07-22 ENCOUNTER — Encounter: Payer: Self-pay | Admitting: Internal Medicine

## 2021-07-22 DIAGNOSIS — F0280 Dementia in other diseases classified elsewhere without behavioral disturbance: Secondary | ICD-10-CM

## 2021-07-22 DIAGNOSIS — I480 Paroxysmal atrial fibrillation: Secondary | ICD-10-CM | POA: Diagnosis not present

## 2021-07-22 DIAGNOSIS — G309 Alzheimer's disease, unspecified: Secondary | ICD-10-CM

## 2021-07-22 DIAGNOSIS — R609 Edema, unspecified: Secondary | ICD-10-CM

## 2021-07-22 DIAGNOSIS — I10 Essential (primary) hypertension: Secondary | ICD-10-CM | POA: Diagnosis not present

## 2021-07-22 DIAGNOSIS — I63431 Cerebral infarction due to embolism of right posterior cerebral artery: Secondary | ICD-10-CM | POA: Diagnosis not present

## 2021-07-22 DIAGNOSIS — N1832 Chronic kidney disease, stage 3b: Secondary | ICD-10-CM

## 2021-07-22 NOTE — Progress Notes (Signed)
Location:   [facility] Room Number: 7 Place of Service:  SNF [phone]) Provider:  Veleta Miners MD  Virgie Dad, MD  Patient Care Team: Virgie Dad, MD as PCP - General (Internal Medicine)  Extended Emergency Contact Information Primary Emergency Contact: [name] Address: [address]          [place], [place] [address] Johnnette Litter of [place] Phone: [phone] Relation: Son  Code Status:  DNR Goals of care: Advanced Directive information Advanced Directives 07/22/2021  Does Patient Have a Medical Advance Directive? Yes  Type of Paramedic of [place];Living will;Out of facility DNR (pink MOST or yellow form)  Does patient want to make changes to medical advance directive? No - Patient declined  Copy of [place] in Chart? Yes - validated most recent copy scanned in chart (See row information)  Would patient like information on creating a medical advance directive? -  Pre-existing out of facility DNR order (yellow form or pink MOST form) Pink MOST form placed in chart (order not valid for inpatient use);Yellow form placed in chart (order not valid for inpatient use)     Chief Complaint  Patient presents with   Medical Management of Chronic Issues    HPI:  Pt is a 88 y.o. female seen today for medical management of chronic diseases.     Long term resident of SNF H/o  right PCA infarct.  Has been on Eliquis since then.   Also has a history of Alzheimer's dementia, hypertension, A. fib.  Her echo had a EF of 60 to 65% with LA dilated CT scan showed Temporal Atrophy and Chronic Cerebral Infarcts   Has been stable in SNF Daughter does not want aggressive measures   No new Nursing issues. No Behavior issues Walks with her walker and Mild assist No Falls Wt Readings from Last 3 Encounters:  07/22/21 175 lb (79.4 kg)  06/23/21 171 lb (77.6 kg)  05/25/21 176 lb (79.8 kg)    History  reviewed. No pertinent surgical history.  Allergies  Allergen Reactions   Bee Venom Other (See Comments)    Listed on MAR    Allergies as of 07/22/2021       Reactions   Bee Venom Other (See Comments)   Listed on [facility]        Medication List        Accurate as of July 22, 2021 11:59 PM. If you have any questions, ask your nurse or doctor.          apixaban 5 MG Tabs tablet Commonly known as: ELIQUIS Take 5 mg by mouth 2 (two) times daily.   bimatoprost 0.01 % Soln Commonly known as: LUMIGAN Place 1 drop into both eyes at bedtime.   CALCIUM 600 + D PO Take 1 tablet by mouth in the morning and at bedtime.   CENTRUM SILVER PO Take 1 tablet by mouth daily.   donepezil 5 MG tablet Commonly known as: ARICEPT Take 5 mg by mouth at bedtime.   furosemide 20 MG tablet Commonly known as: LASIX Take 20 mg by mouth daily.   lactose free nutrition Liqd Take 237 mLs by mouth 2 (two) times daily.   Metoprolol Succinate 50 MG Cs24 Take 50 mg by mouth daily. Hold if Systolic is < 95 or if pulse is less than 60   zinc oxide 20 % ointment Apply 1 application topically as needed for irritation. Apply to buttocks  after every incontinent episode and as needed for redness        Review of Systems  Unable to perform ROS: Dementia   Immunization History  Administered Date(s) Administered   Influenza-Unspecified 04/02/2014, 04/01/2016, 04/14/2017, 03/17/2020, 04/07/2021   Moderna SARS-COV2 Booster Vaccination 11/18/2020   Moderna Sars-Covid-2 Vaccination 06/17/2019, 07/15/2019, 04/27/2020   PFIZER(Purple Top)SARS-COV-2 Vaccination 03/03/2021   Pneumococcal Conjugate-13 03/24/2014   Pneumococcal Polysaccharide-23 02/26/2009   Tdap 03/08/2012, 12/23/2018   Zoster, Live 09/03/2007   Pertinent  Health Maintenance Due  Topic Date Due   DEXA SCAN  10/26/2021 (Originally 07/18/1994)   INFLUENZA VACCINE  Completed   URINE MICROALBUMIN  Discontinued   Fall Risk 11/14/2019  11/14/2019 11/15/2019 11/16/2019 12/11/2020  Falls in the past year? - - - - 1  Was there an injury with Fall? - - - - 0  Fall Risk Category Calculator - - - - 2  Fall Risk Category - - - - Moderate  Patient Fall Risk Level High fall risk High fall risk High fall risk High fall risk High fall risk  Patient at Risk for Falls Due to - - - - History of fall(s);Impaired balance/gait;Impaired mobility;Mental status change  Fall risk Follow up - - - - Falls evaluation completed;Education provided;Falls prevention discussed   Functional Status Survey:    Vitals:   07/22/21 1015  BP: (!) 157/67  Pulse: 61  Resp: 20  Temp: (!) 97.2 F (36.2 C)  SpO2: 96%  Weight: 175 lb (79.4 kg)  Height: 5\' 1"  (1.549 m)   Body mass index is 33.07 kg/m. Physical Exam Vitals reviewed.  Constitutional:      Appearance: Normal appearance.  HENT:     Head: Normocephalic.     Nose: Nose normal.     Mouth/Throat:     Mouth: Mucous membranes are moist.     Pharynx: Oropharynx is clear.  Eyes:     Pupils: Pupils are equal, round, and reactive to light.  Cardiovascular:     Rate and Rhythm: Normal rate and regular rhythm.     Pulses: Normal pulses.     Heart sounds: Normal heart sounds. No murmur heard. Pulmonary:     Effort: Pulmonary effort is normal.     Breath sounds: Normal breath sounds.  Abdominal:     General: Abdomen is flat. Bowel sounds are normal.     Palpations: Abdomen is soft.  Musculoskeletal:        General: Swelling present.     Cervical back: Neck supple.  Skin:    General: Skin is warm.  Neurological:     General: No focal deficit present.     Mental Status: She is alert.  Psychiatric:        Mood and Affect: Mood normal.        Thought Content: Thought content normal.    Labs reviewed: Recent Labs    09/07/20 0000 09/19/20 1003 03/18/21 0000  NA 139 135 138  K 4.4 4.1 4.7  CL 101 99 102  CO2 27* 29 26*  GLUCOSE  --  139*  --   BUN 24* 20 29*  CREATININE 1.1 1.04* 1.0   CALCIUM 9.5 9.3 9.0   Recent Labs    09/07/20 0000 03/18/21 0000  AST 17 16  ALT 13 13  ALKPHOS 98 91  ALBUMIN 3.9 3.5   Recent Labs    09/07/20 0000 09/19/20 1003 03/18/21 0000  WBC 9.6 12.2* 8.2  NEUTROABS 6,134.00  --  5,609.00  HGB 12.3 12.9 11.1*  HCT 38 39.6 34*  MCV  --  92.3  --   PLT 264 286 250   Lab Results  Component Value Date   TSH 2.35 03/18/2021   Lab Results  Component Value Date   HGBA1C 5.3 11/13/2019   Lab Results  Component Value Date   CHOL 89 02/27/2020   HDL 46 02/27/2020   LDLCALC 30 02/27/2020   TRIG 51 02/27/2020   CHOLHDL 2.2 11/13/2019    Significant Diagnostic Results in last 30 days:  No results found.  Assessment/Plan 1. Paroxysmal atrial fibrillation (HCC) Doing well on Eliquis On Metoprolol  2. Cerebrovascular accident (CVA) due to embolism of right posterior cerebral artery (HCC) Eliquis Off statin per Family request  3. Essential hypertension Stable on Metoprolol  4. Alzheimer disease ([place]) Continue Aricept  5. Edema, unspecified type Lasix QD  6. Stage 3b chronic kidney disease ([place]) Creat stable   Family/ staff Communication:   Labs/tests ordered:

## 2021-07-23 ENCOUNTER — Encounter: Payer: Self-pay | Admitting: Internal Medicine

## 2021-08-11 ENCOUNTER — Non-Acute Institutional Stay (SKILLED_NURSING_FACILITY): Payer: Medicare Other | Admitting: Orthopedic Surgery

## 2021-08-11 ENCOUNTER — Encounter: Payer: Self-pay | Admitting: Orthopedic Surgery

## 2021-08-11 DIAGNOSIS — Z66 Do not resuscitate: Secondary | ICD-10-CM

## 2021-08-11 DIAGNOSIS — R609 Edema, unspecified: Secondary | ICD-10-CM

## 2021-08-11 DIAGNOSIS — E782 Mixed hyperlipidemia: Secondary | ICD-10-CM

## 2021-08-11 DIAGNOSIS — G309 Alzheimer's disease, unspecified: Secondary | ICD-10-CM | POA: Diagnosis not present

## 2021-08-11 DIAGNOSIS — H1131 Conjunctival hemorrhage, right eye: Secondary | ICD-10-CM

## 2021-08-11 DIAGNOSIS — N1832 Chronic kidney disease, stage 3b: Secondary | ICD-10-CM

## 2021-08-11 DIAGNOSIS — F0280 Dementia in other diseases classified elsewhere without behavioral disturbance: Secondary | ICD-10-CM

## 2021-08-11 DIAGNOSIS — I480 Paroxysmal atrial fibrillation: Secondary | ICD-10-CM | POA: Diagnosis not present

## 2021-08-11 NOTE — Progress Notes (Signed)
?Location:  [facility] Room Number: N07/A ?Place of Service:  SNF (31) ?Provider: Yvonna Alanis, NP ? ? ?Patient Care Team: ?Virgie Dad, MD as PCP - General (Internal Medicine) ? ?Extended Emergency Contact Information ?Primary Emergency Contact: [name] ?Address: [address] ?         [place], [place] [address] [place] ?Home Phone: [phone] ?Relation: Son ? ?Code Status:  DNR  ?Goals of care: Advanced Directive information ?Advanced Directives 08/11/2021  ?Does Patient Have a Medical Advance Directive? Yes  ?Type of Paramedic of [place];Living will;Out of facility DNR (pink MOST or yellow form)  ?Does patient want to make changes to medical advance directive? No - Patient declined  ?Copy of [place] in Chart? Yes - validated most recent copy scanned in chart (See row information)  ?Would patient like information on creating a medical advance directive? -  ?Pre-existing out of facility DNR order (yellow form or pink MOST form) Pink MOST form placed in chart (order not valid for inpatient use);Yellow form placed in chart (order not valid for inpatient use)  ? ? ? ?Chief Complaint  ?Patient presents with  ? Acute Visit  ?  Right eye redness  ? ? ?HPI:  ?Pt is a 88 y.o. female seen today for an acute visit for right eye redness.  ? ?Nursing reports right eye redness this morning. No reports of fall or injury. She is a poor historian due to Alzheimer's dementia. She denies changes in her vision or eye pain. She is able to correctly read a few words off her menu and television.  ? ?Alzheimer's- CT head 09/2020 noted chronic cerebral infarcts and advanced bilateral temporal lobe atrophy, no behavioral outbursts, MMSE 12/30 2021, ambulates with walker/1 person assist, remains on Aricept ?Afib- HR controlled with metoprolol, remains on Eliquis for clot prevention ?CKD- BUN/creat 29/1.0 03/18/2021 ?HLD- LDL 30 02/2020, off  statin per HPOA ?BLE- no increased edema, remains on lasix daily ? ? ? ?Past Medical History:  ?Diagnosis Date  ? Alzheimer disease ([place])   ? Atrial fibrillation ([place])   ? Hypertension   ? ?History reviewed. No pertinent surgical history. ? ?Allergies  ?Allergen Reactions  ? Bee Venom Other (See Comments)  ?  Listed on MAR  ? ? ?Outpatient Encounter Medications as of 08/11/2021  ?Medication Sig  ? apixaban (ELIQUIS) 5 MG TABS tablet Take 5 mg by mouth 2 (two) times daily.  ? bimatoprost (LUMIGAN) 0.01 % SOLN Place 1 drop into both eyes at bedtime.   ? Calcium Carb-Cholecalciferol (CALCIUM 600 + D PO) Take 1 tablet by mouth in the morning and at bedtime.  ? donepezil (ARICEPT) 5 MG tablet Take 5 mg by mouth at bedtime.  ? furosemide (LASIX) 20 MG tablet Take 20 mg by mouth daily.  ? lactose free nutrition (BOOST) LIQD Take 237 mLs by mouth 2 (two) times daily.  ? Metoprolol Succinate 50 MG CS24 Take 50 mg by mouth daily. Hold if Systolic is < 95 or if pulse is less than 60  ? Multiple Vitamins-Minerals (CENTRUM SILVER PO) Take 1 tablet by mouth daily.  ? zinc oxide 20 % ointment Apply 1 application topically as needed for irritation. Apply to buttocks after every incontinent episode and as needed for redness  ? ?No facility-administered encounter medications on file as of 08/11/2021.  ? ? ?Review of Systems  ?Unable to perform ROS: Dementia  ? ?Immunization History  ?Administered  Date(s) Administered  ? Influenza-Unspecified 04/02/2014, 04/01/2016, 04/14/2017, 03/17/2020, 04/07/2021  ? Moderna SARS-COV2 Booster Vaccination 11/18/2020  ? Moderna Sars-Covid-2 Vaccination 06/17/2019, 07/15/2019, 04/27/2020  ? PFIZER(Purple Top)SARS-COV-2 Vaccination 03/03/2021  ? Pneumococcal Conjugate-13 03/24/2014  ? Pneumococcal Polysaccharide-23 02/26/2009  ? Tdap 03/08/2012, 12/23/2018  ? Zoster, Live 09/03/2007  ? ?Pertinent  Health Maintenance Due  ?Topic Date Due  ? DEXA SCAN  10/26/2021 (Originally 07[birth year]18/1994)  ? INFLUENZA VACCINE   Completed  ? URINE MICROALBUMIN  Discontinued  ? ?Fall Risk 11/14/2019 11/14/2019 11/15/2019 11/16/2019 12/11/2020  ?Falls in the past year? - - - - 1  ?Was there an injury with Fall? - - - - 0  ?Fall Risk Category Calculator - - - - 2  ?Fall Risk Category - - - - Moderate  ?Patient Fall Risk Level High fall risk High fall risk High fall risk High fall risk High fall risk  ?Patient at Risk for Falls Due to - - - - History of fall(s);Impaired balance/gait;Impaired mobility;Mental status change  ?Fall risk Follow up - - - - Falls evaluation completed;Education provided;Falls prevention discussed  ? ?Functional Status Survey: ?  ? ?Vitals:  ? 08/11/21 1120  ?BP: (!) 122/50  ?Pulse: 60  ?Resp: 18  ?Temp: (!) 97.1 ?F (36.2 ?C)  ?SpO2: 98%  ?Weight: 175 lb (79.4 kg)  ?Height: 5\' 1"  (1.549 m)  ? ?Body mass index is 33.07 kg/m?Marland Kitchen ?Physical Exam ?Vitals reviewed.  ?Constitutional:   ?   General: She is not in acute distress. ?HENT:  ?   Head: Normocephalic and atraumatic.  ?Eyes:  ?   General:     ?   Right eye: No discharge.     ?   Left eye: No discharge.  ?   Extraocular Movements:  ?   Right eye: Normal extraocular motion and no nystagmus.  ?   Conjunctiva/sclera:  ?   Right eye: Hemorrhage present.  ?   Comments: Subconjunctival hemorrhage to right inner sclera  ?Cardiovascular:  ?   Rate and Rhythm: Normal rate. Rhythm irregular.  ?   Pulses: Normal pulses.  ?   Heart sounds: Normal heart sounds.  ?Pulmonary:  ?   Effort: Pulmonary effort is normal. No respiratory distress.  ?   Breath sounds: Normal breath sounds. No wheezing.  ?Abdominal:  ?   General: Bowel sounds are normal. There is no distension.  ?   Palpations: Abdomen is soft.  ?   Tenderness: There is no abdominal tenderness.  ?Musculoskeletal:  ?   Cervical back: Neck supple.  ?   Right lower leg: Edema present.  ?   Left lower leg: Edema present.  ?   Comments: Non-pitting  ?Skin: ?   General: Skin is warm and dry.  ?   Capillary Refill: Capillary refill takes less  than 2 seconds.  ?Neurological:  ?   General: No focal deficit present.  ?   Mental Status: She is alert. Mental status is at baseline.  ?   Motor: Weakness present.  ?   Gait: Gait abnormal.  ?   Comments: Walker/wheelchair  ?Psychiatric:     ?   Mood and Affect: Mood normal.     ?   Behavior: Behavior normal.     ?   Cognition and Memory: Memory is impaired.  ? ? ?Labs reviewed: ?Recent Labs  ?  09/07/20 ?0000 09/19/20 ?1003 03/18/21 ?0000  ?NA 139 135 138  ?K 4.4 4.1 4.7  ?CL 101 99 102  ?CO2  27* 29 26*  ?GLUCOSE  --  139*  --   ?BUN 24* 20 29*  ?CREATININE 1.1 1.04* 1.0  ?CALCIUM 9.5 9.3 9.0  ? ?Recent Labs  ?  09/07/20 ?0000 03/18/21 ?0000  ?AST 17 16  ?ALT 13 13  ?ALKPHOS 98 91  ?ALBUMIN 3.9 3.5  ? ?Recent Labs  ?  09/07/20 ?0000 09/19/20 ?1003 03/18/21 ?0000  ?WBC 9.6 12.2* 8.2  ?NEUTROABS 6,134.00  --  5,609.00  ?HGB 12.3 12.9 11.1*  ?HCT 38 39.6 34*  ?MCV  --  92.3  --   ?PLT 264 286 250  ? ?Lab Results  ?Component Value Date  ? TSH 2.35 03/18/2021  ? ?Lab Results  ?Component Value Date  ? HGBA1C 5.3 11/13/2019  ? ?Lab Results  ?Component Value Date  ? CHOL 89 02/27/2020  ? HDL 46 02/27/2020  ? [place] 30 02/27/2020  ? TRIG 51 02/27/2020  ? CHOLHDL 2.2 11/13/2019  ? ? ?Significant Diagnostic Results in last 30 days:  ?No results found. ? ?Assessment/Plan: ?1. Do not resuscitate ? ?2. Subconjunctival hemorrhage of right eye ?- no apparent eye injury or pain ?- redness to inner sclera noted ?- education given to patient and nursing staff ? ?3. Alzheimer disease ([place]) ?- no behavioral outbursts ?- ambulates with assist ?- cont Aricept ?- cont skilled nursing care ? ?4. Paroxysmal atrial fibrillation (HCC) ?- HR controlled with metoprolol ?- cont Eliquis for clot prevention ? ?5. Stage 3b chronic kidney disease ([place]) ?- cont to avoid nephrotoxic drugs like NSAIDS and dose adjust medication to be renally excreted ? ?6. Mixed hyperlipidemia ?- off statin per family  ? ?7. Edema, unspecified type ?- non pitting  today ?- cont furosemide ? ? ?Family/ staff Communication: plan discussed with patient and nurse ? ?Labs/tests ordered:  none ? ?

## 2021-08-16 ENCOUNTER — Encounter: Payer: Self-pay | Admitting: Orthopedic Surgery

## 2021-08-16 ENCOUNTER — Non-Acute Institutional Stay (SKILLED_NURSING_FACILITY): Payer: Medicare Other | Admitting: Orthopedic Surgery

## 2021-08-16 DIAGNOSIS — I480 Paroxysmal atrial fibrillation: Secondary | ICD-10-CM

## 2021-08-16 DIAGNOSIS — H1131 Conjunctival hemorrhage, right eye: Secondary | ICD-10-CM

## 2021-08-16 DIAGNOSIS — E782 Mixed hyperlipidemia: Secondary | ICD-10-CM

## 2021-08-16 DIAGNOSIS — N1832 Chronic kidney disease, stage 3b: Secondary | ICD-10-CM

## 2021-08-16 DIAGNOSIS — R634 Abnormal weight loss: Secondary | ICD-10-CM

## 2021-08-16 DIAGNOSIS — G309 Alzheimer's disease, unspecified: Secondary | ICD-10-CM | POA: Diagnosis not present

## 2021-08-16 DIAGNOSIS — I63431 Cerebral infarction due to embolism of right posterior cerebral artery: Secondary | ICD-10-CM

## 2021-08-16 DIAGNOSIS — F0280 Dementia in other diseases classified elsewhere without behavioral disturbance: Secondary | ICD-10-CM

## 2021-08-16 DIAGNOSIS — J3489 Other specified disorders of nose and nasal sinuses: Secondary | ICD-10-CM

## 2021-08-16 DIAGNOSIS — N183 Chronic kidney disease, stage 3 unspecified: Secondary | ICD-10-CM

## 2021-08-16 DIAGNOSIS — R600 Localized edema: Secondary | ICD-10-CM

## 2021-08-16 DIAGNOSIS — D631 Anemia in chronic kidney disease: Secondary | ICD-10-CM

## 2021-08-16 MED ORDER — LORATADINE 10 MG PO TABS: 10.0000 mg | ORAL_TABLET | Freq: Every day | ORAL | 11 refills | Status: DC

## 2021-08-16 NOTE — Progress Notes (Signed)
Location:  [facility] Room Number: N07/A Place of Service:  SNF [phone]) Provider:  Yvonna Alanis, NP  Patient Care Team: Virgie Dad, MD as PCP - General (Internal Medicine)  Extended Emergency Contact Information Primary Emergency Contact: [name] Address: [address]          [place], [place] [address] Johnnette Litter of [place] Phone: [phone] Relation: Son  Code Status:  DNR Goals of care: Advanced Directive information Advanced Directives 08/16/2021  Does Patient Have a Medical Advance Directive? Yes  Type of Paramedic of [place];Living will;Out of facility DNR (pink MOST or yellow form)  Does patient want to make changes to medical advance directive? No - Patient declined  Copy of [place] in Chart? Yes - validated most recent copy scanned in chart (See row information)  Would patient like information on creating a medical advance directive? -  Pre-existing out of facility DNR order (yellow form or pink MOST form) Pink MOST form placed in chart (order not valid for inpatient use);Yellow form placed in chart (order not valid for inpatient use)     Chief Complaint  Patient presents with   Medical Management of Chronic Issues    Routine visit    HPI:  Pt is a 88 y.o. female seen today for medical management of chronic diseases.    She continues to reside on the skilled nursing unit at [facility]. Past medical history includes: CVA, hypertension, atrial fibrillation, Alzheimer's, CKD stage 3, hyperlipidemia and anemia related to CKD.   Alzheimer's- no behavioral outbursts, MMSE 12/30 2021, remains on Aricept Afib- HR controlled with metoprolol, remains on Eliquis for clot prevention, TSH 2.35 03/18/2021 CKD- BUN/creat 29/1.0 03/18/2021 HLD- LDL 30 02/2020, off statin per HPOA BLE- no increased edema, remains on lasix daily Subconjunctival hemorrhage- first noticed 03/01, no complaints  per nursing Rhinorrhea- clear nasal drainage noted during encounter, no other cold symptoms, afebrile Anemia- hgb 11.1 03/18/2021, ferritin 49, iron 51, B12 475 05/2020 Hx CVA- MRI brain 11/2019 revealed acute right PCA territory infarct with petechial hemorrhage, left sided weakness, off statin at this time per HPOA Weight loss- remains on Boost   No recent falls or injuries. Ambulates with walker.    Recent blood pressures: 03/06- 132/62 03/05- 126/73 03/04- 136/80  Recent weights:  02/09- 175 lbs  02/01- 165 lbs  01/18- 173 lbs    Past Medical History:  Diagnosis Date   Alzheimer disease ([place])    Atrial fibrillation ([place])    Hypertension    History reviewed. No pertinent surgical history.  Allergies  Allergen Reactions   Bee Venom Other (See Comments)    Listed on [facility]    Outpatient Encounter Medications as of 08/16/2021  Medication Sig   apixaban (ELIQUIS) 5 MG TABS tablet Take 5 mg by mouth 2 (two) times daily.   bimatoprost (LUMIGAN) 0.01 % SOLN Place 1 drop into both eyes at bedtime.    Calcium Carb-Cholecalciferol (CALCIUM 600 + D PO) Take 1 tablet by mouth in the morning and at bedtime.   donepezil (ARICEPT) 5 MG tablet Take 5 mg by mouth at bedtime.   furosemide (LASIX) 20 MG tablet Take 20 mg by mouth daily.   lactose free nutrition (BOOST) LIQD Take 237 mLs by mouth 2 (two) times daily.   Metoprolol Succinate 50 MG CS24 Take 50 mg by mouth daily. Hold if Systolic is < 95 or if pulse is less than 60  Multiple Vitamins-Minerals (CENTRUM SILVER PO) Take 1 tablet by mouth daily.   zinc oxide 20 % ointment Apply 1 application topically as needed for irritation. Apply to buttocks after every incontinent episode and as needed for redness   No facility-administered encounter medications on file as of 08/16/2021.    Review of Systems  Unable to perform ROS: Dementia   Immunization History  Administered Date(s) Administered   Influenza-Unspecified 04/02/2014,  04/01/2016, 04/14/2017, 03/17/2020, 04/07/2021   Moderna SARS-COV2 Booster Vaccination 11/18/2020   Moderna Sars-Covid-2 Vaccination 06/17/2019, 07/15/2019, 04/27/2020   PFIZER(Purple Top)SARS-COV-2 Vaccination 03/03/2021   Pneumococcal Conjugate-13 03/24/2014   Pneumococcal Polysaccharide-23 02/26/2009   Tdap 03/08/2012, 12/23/2018   Zoster, Live 09/03/2007   Pertinent  Health Maintenance Due  Topic Date Due   DEXA SCAN  10/26/2021 (Originally 07[birth year]18/1994)   INFLUENZA VACCINE  Completed   URINE MICROALBUMIN  Discontinued   Fall Risk 11/14/2019 11/14/2019 11/15/2019 11/16/2019 12/11/2020  Falls in the past year? - - - - 1  Was there an injury with Fall? - - - - 0  Fall Risk Category Calculator - - - - 2  Fall Risk Category - - - - Moderate  Patient Fall Risk Level High fall risk High fall risk High fall risk High fall risk High fall risk  Patient at Risk for Falls Due to - - - - History of fall(s);Impaired balance/gait;Impaired mobility;Mental status change  Fall risk Follow up - - - - Falls evaluation completed;Education provided;Falls prevention discussed   Functional Status Survey:    Vitals:   08/16/21 0947  BP: 126/73  Pulse: 77  Resp: 18  Temp: (!) 97.1 F (36.2 C)  SpO2: 95%  Weight: 175 lb (79.4 kg)  Height: 5\' 1"  (1.549 m)   Body mass index is 33.07 kg/m. Physical Exam Vitals reviewed.  Constitutional:      General: She is not in acute distress. HENT:     Head: Normocephalic.     Right Ear: There is no impacted cerumen.     Left Ear: There is no impacted cerumen.     Nose: Rhinorrhea present.     Comments: Clear nasal drainage     Mouth/Throat:     Mouth: Mucous membranes are moist.  Eyes:     General:        Right eye: No discharge.        Left eye: No discharge.     Comments: Unchanged subconjunctival hemorrhage to right inner sclera   Neck:     Vascular: No carotid bruit.  Cardiovascular:     Rate and Rhythm: Normal rate. Rhythm irregular.     Pulses:  Normal pulses.     Heart sounds: Normal heart sounds. No murmur heard. Pulmonary:     Effort: Pulmonary effort is normal. No respiratory distress.     Breath sounds: Normal breath sounds. No wheezing.  Abdominal:     General: Bowel sounds are normal. There is no distension.     Palpations: Abdomen is soft.     Tenderness: There is no abdominal tenderness.  Musculoskeletal:     Cervical back: Neck supple.     Right lower leg: Edema present.     Left lower leg: Edema present.     Comments: Non pitting  Feet:     Right foot:     Toenail Condition: Right toenails are abnormally thick and long. Fungal disease present.    Left foot:     Toenail Condition: Left toenails are abnormally thick  and long. Fungal disease present. Lymphadenopathy:     Cervical: No cervical adenopathy.  Skin:    General: Skin is warm and dry.     Capillary Refill: Capillary refill takes less than 2 seconds.  Neurological:     General: No focal deficit present.     Mental Status: She is alert. Mental status is at baseline.     Motor: Weakness present.     Gait: Gait abnormal.     Comments: walker  Psychiatric:        Mood and Affect: Mood normal.        Behavior: Behavior normal.        Cognition and Memory: Memory is impaired.     Comments: Very pleasant, follows commands, alert to self    Labs reviewed: Recent Labs    09/07/20 0000 09/19/20 1003 03/18/21 0000  NA 139 135 138  K 4.4 4.1 4.7  CL 101 99 102  CO2 27* 29 26*  GLUCOSE  --  139*  --   BUN 24* 20 29*  CREATININE 1.1 1.04* 1.0  CALCIUM 9.5 9.3 9.0   Recent Labs    09/07/20 0000 03/18/21 0000  AST 17 16  ALT 13 13  ALKPHOS 98 91  ALBUMIN 3.9 3.5   Recent Labs    09/07/20 0000 09/19/20 1003 03/18/21 0000  WBC 9.6 12.2* 8.2  NEUTROABS 6,134.00  --  5,609.00  HGB 12.3 12.9 11.1*  HCT 38 39.6 34*  MCV  --  92.3  --   PLT 264 286 250   Lab Results  Component Value Date   TSH 2.35 03/18/2021   Lab Results  Component  Value Date   HGBA1C 5.3 11/13/2019   Lab Results  Component Value Date   CHOL 89 02/27/2020   HDL 46 02/27/2020   LDLCALC 30 02/27/2020   TRIG 51 02/27/2020   CHOLHDL 2.2 11/13/2019    Significant Diagnostic Results in last 30 days:  No results found.  Assessment/Plan 1. Alzheimer disease ([place]) - no behavioral outbursts - ambulates with walker - cont skilled nursing care  2. Paroxysmal atrial fibrillation (HCC) - HR controlled with metoprolol - cont Eliquis for clot prevention  3. Stage 3b chronic kidney disease ([place]) - cont to avoid nephrotoxic drugs like NSAIDS and dose adjust medications to be renally excreted  4. Mixed hyperlipidemia - off statin per HPOA  5. Bilateral lower extremity edema - non pitting - cont furosemide  6. Subconjunctival hemorrhage of right eye - stable - education given to nursing staff  7. Rhinorrhea - clear nasal drainage, no other cold symptoms - suspect allergies - start Claritin 10 mg po qhs x 14 days  8. Anemia due to stage 3 chronic kidney disease, unspecified whether stage 3a or 3b CKD (HCC) - hgb stable  9. Cerebrovascular accident (CVA) due to embolism of right posterior cerebral artery (HCC) - off statin per HPOA  10. Weight loss - stable - cont Boost   Family/ staff Communication: plan discussed with patient and nurse  Labs/tests ordered:  none

## 2021-08-16 NOTE — Progress Notes (Deleted)
?Location:  [facility] ?Nursing Home Room Number: N07/A ?Place of Service:  SNF (31) ?Provider: Octavia Heir, NP ? ?Patient Care Team: ?Mahlon Gammon, MD as PCP - General (Internal Medicine) ? ?Extended Emergency Contact Information ?Primary Emergency Contact: Marval Regal ?Address: [address] ?         [place], [place] [address] [place] ?Home Phone: [phone] ?Relation: Son ? ?Code Status:  DNR ?Goals of care: Advanced Directive information ?Advanced Directives 08/16/2021  ?Does Patient Have a Medical Advance Directive? Yes  ?Type of Estate agent of [place];Living will;Out of facility DNR (pink MOST or yellow form)  ?Does patient want to make changes to medical advance directive? No - Patient declined  ?Copy of Healthcare Power of Attorney in Chart? Yes - validated most recent copy scanned in chart (See row information)  ?Would patient like information on creating a medical advance directive? -  ?Pre-existing out of facility DNR order (yellow form or pink MOST form) Pink MOST form placed in chart (order not valid for inpatient use);Yellow form placed in chart (order not valid for inpatient use)  ? ? ? ?Chief Complaint  ?Patient presents with  ? Medical Management of Chronic Issues  ?  Routine visit  ? ? ?HPI:  ?Pt is a 88 y.o. female seen today for an acute visit for  ? ? ?Past Medical History:  ?Diagnosis Date  ? Alzheimer disease (HCC)   ? Atrial fibrillation (HCC)   ? Hypertension   ? ?History reviewed. No pertinent surgical history. ? ?Allergies  ?Allergen Reactions  ? Bee Venom Other (See Comments)  ?  Listed on MAR  ? ? ?Outpatient Encounter Medications as of 08/16/2021  ?Medication Sig  ? apixaban (ELIQUIS) 5 MG TABS tablet Take 5 mg by mouth 2 (two) times daily.  ? bimatoprost (LUMIGAN) 0.01 % SOLN Place 1 drop into both eyes at bedtime.   ? Calcium Carb-Cholecalciferol (CALCIUM 600 + D PO) Take 1 tablet by mouth in the morning and at bedtime.  ?  donepezil (ARICEPT) 5 MG tablet Take 5 mg by mouth at bedtime.  ? furosemide (LASIX) 20 MG tablet Take 20 mg by mouth daily.  ? lactose free nutrition (BOOST) LIQD Take 237 mLs by mouth 2 (two) times daily.  ? Metoprolol Succinate 50 MG CS24 Take 50 mg by mouth daily. Hold if Systolic is < 95 or if pulse is less than 60  ? Multiple Vitamins-Minerals (CENTRUM SILVER PO) Take 1 tablet by mouth daily.  ? zinc oxide 20 % ointment Apply 1 application topically as needed for irritation. Apply to buttocks after every incontinent episode and as needed for redness  ? ?No facility-administered encounter medications on file as of 08/16/2021.  ? ? ?Review of Systems ? ?Immunization History  ?Administered Date(s) Administered  ? Influenza-Unspecified 04/02/2014, 04/01/2016, 04/14/2017, 03/17/2020, 04/07/2021  ? Moderna SARS-COV2 Booster Vaccination 11/18/2020  ? Moderna Sars-Covid-2 Vaccination 06/17/2019, 07/15/2019, 04/27/2020  ? PFIZER(Purple Top)SARS-COV-2 Vaccination 03/03/2021  ? Pneumococcal Conjugate-13 03/24/2014  ? Pneumococcal Polysaccharide-23 02/26/2009  ? Tdap 03/08/2012, 12/23/2018  ? Zoster, Live 09/03/2007  ? ?Pertinent  Health Maintenance Due  ?Topic Date Due  ? DEXA SCAN  10/26/2021 (Originally 07[birth year]/1994)  ? INFLUENZA VACCINE  Completed  ? URINE MICROALBUMIN  Discontinued  ? ?Fall Risk 11/14/2019 11/14/2019 11/15/2019 11/16/2019 12/11/2020  ?Falls in the past year? - - - - 1  ?Was there an injury with Fall? - - - - 0  ?Fall Risk Category  Calculator - - - - 2  ?Fall Risk Category - - - - Moderate  ?Patient Fall Risk Level High fall risk High fall risk High fall risk High fall risk High fall risk  ?Patient at Risk for Falls Due to - - - - History of fall(s);Impaired balance/gait;Impaired mobility;Mental status change  ?Fall risk Follow up - - - - Falls evaluation completed;Education provided;Falls prevention discussed  ? ?Functional Status Survey: ?  ? ?Vitals:  ? 08/16/21 0947  ?BP: 126/73  ?Pulse: 77  ?Resp: 18  ?Temp: (!)  97.1 ?F (36.2 ?C)  ?SpO2: 95%  ?Weight: 175 lb (79.4 kg)  ?Height: 5\' 1"  (1.549 m)  ? ?Body mass index is 33.07 kg/m? ?Physical Exam ? ?Labs reviewed: ?Recent Labs  ?  09/07/20 ?0000 09/19/20 ?1003 03/18/21 ?0000  ?NA 139 135 138  ?K 4.4 4.1 4.7  ?CL 101 99 102  ?CO2 27* 29 26*  ?GLUCOSE  --  139*  --   ?BUN 24* 20 29*  ?CREATININE 1.1 1.04* 1.0  ?CALCIUM 9.5 9.3 9.0  ? ?Recent Labs  ?  09/07/20 ?0000 03/18/21 ?0000  ?AST 17 16  ?ALT 13 13  ?ALKPHOS 98 91  ?ALBUMIN 3.9 3.5  ? ?Recent Labs  ?  09/07/20 ?0000 09/19/20 ?1003 03/18/21 ?0000  ?WBC 9.6 12.2* 8.2  ?NEUTROABS 6,134.00  --  5,609.00  ?HGB 12.3 12.9 11.1*  ?HCT 38 39.6 34*  ?MCV  --  92.3  --   ?PLT 264 286 250  ? ?Lab Results  ?Component Value Date  ? TSH 2.35 03/18/2021  ? ?Lab Results  ?Component Value Date  ? HGBA1C 5.3 11/13/2019  ? ?Lab Results  ?Component Value Date  ? CHOL 89 02/27/2020  ? HDL 46 02/27/2020  ? LDLCALC 30 02/27/2020  ? TRIG 51 02/27/2020  ? CHOLHDL 2.2 11/13/2019  ? ? ?Significant Diagnostic Results in last 30 days:  ?No results found. ? ?Assessment/Plan ?There are no diagnoses linked to this encounter. ? ? ?Family/ staff Communication: *** ? ?Labs/tests ordered:  *** ? ?

## 2021-09-15 ENCOUNTER — Encounter: Payer: Self-pay | Admitting: Orthopedic Surgery

## 2021-09-15 ENCOUNTER — Non-Acute Institutional Stay (SKILLED_NURSING_FACILITY): Payer: Medicare Other | Admitting: Orthopedic Surgery

## 2021-09-15 DIAGNOSIS — N183 Chronic kidney disease, stage 3 unspecified: Secondary | ICD-10-CM

## 2021-09-15 DIAGNOSIS — J3489 Other specified disorders of nose and nasal sinuses: Secondary | ICD-10-CM

## 2021-09-15 DIAGNOSIS — I480 Paroxysmal atrial fibrillation: Secondary | ICD-10-CM | POA: Diagnosis not present

## 2021-09-15 DIAGNOSIS — E782 Mixed hyperlipidemia: Secondary | ICD-10-CM

## 2021-09-15 DIAGNOSIS — N1832 Chronic kidney disease, stage 3b: Secondary | ICD-10-CM

## 2021-09-15 DIAGNOSIS — R600 Localized edema: Secondary | ICD-10-CM

## 2021-09-15 DIAGNOSIS — F0280 Dementia in other diseases classified elsewhere without behavioral disturbance: Secondary | ICD-10-CM

## 2021-09-15 DIAGNOSIS — G309 Alzheimer's disease, unspecified: Secondary | ICD-10-CM | POA: Diagnosis not present

## 2021-09-15 DIAGNOSIS — I63431 Cerebral infarction due to embolism of right posterior cerebral artery: Secondary | ICD-10-CM

## 2021-09-15 DIAGNOSIS — D631 Anemia in chronic kidney disease: Secondary | ICD-10-CM

## 2021-09-15 DIAGNOSIS — R635 Abnormal weight gain: Secondary | ICD-10-CM | POA: Diagnosis not present

## 2021-09-15 NOTE — Progress Notes (Signed)
?Location:  [facility] Room Number: N07/A ?Place of Service:  SNF (31) ?Provider: Yvonna Alanis, NP ? ?Patient Care Team: ?Virgie Dad, MD as PCP - General (Internal Medicine) ? ?Extended Emergency Contact Information ?Primary Emergency Contact: [name] ?Address: [address] ?         [place], [place] [address] [place] ?Home Phone: [phone] ?Relation: Son ? ?Code Status:  DNR ?Goals of care: Advanced Directive information ? ?  09/15/2021  ?  9:53 AM  ?Advanced Directives  ?Does Patient Have a Medical Advance Directive? Yes  ?Type of Paramedic of [place];Living will;Out of facility DNR (pink MOST or yellow form)  ?Does patient want to make changes to medical advance directive? No - Patient declined  ?Copy of [place] in Chart? Yes - validated most recent copy scanned in chart (See row information)  ?Pre-existing out of facility DNR order (yellow form or pink MOST form) Pink MOST form placed in chart (order not valid for inpatient use);Yellow form placed in chart (order not valid for inpatient use)  ? ? ? ?Chief Complaint  ?Patient presents with  ? Medical Management of Chronic Issues  ?  Routine visit  ? ? ?HPI:  ?Pt is a 88 y.o. female seen today for medical management of chronic diseases.   ? ?She continues to reside on the skilled nursing unit at [facility]. Past medical history includes: CVA, hypertension, atrial fibrillation, Alzheimer's, CKD stage 3, hyperlipidemia and anemia related to CKD.  ? ?Alzheimer's- no behavioral outbursts, less talkative, MMSE 12/30 2021, remains on Aricept ?Weight gain- see weights below, remains on Boost ?Afib- HR controlled with metoprolol, remains on Eliquis for clot prevention, TSH 2.35 03/18/2021 ?CKD- BUN/creat 29/1.0 03/18/2021 ?HLD- LDL 30 02/2020, off statin per HPOA ?BLE- no increased edema, remains on lasix daily ?Rhinorrhea- noted last month, drainage clear ?Anemia-  hgb 11.1 03/18/2021, ferritin 49, iron 51, B12 475 05/2020 ?Hx CVA- MRI brain 11/2019 revealed acute right PCA territory infarct with petechial hemorrhage, left sided weakness, off statin at this time per HPOA ? ?No recent falls or injuries. Ambulates with walker and wheelchair for long distances.  ? ?Recent blood pressures: ? 04/05- 136/57 ? 04/04- 138/71, 137/63 ? 04/03- 136/70 ? ?Recent weights: ? 04/01- not recorded ? 03/06- 180 lbs ? 02/09- 175 lbs ? ?Past Medical History:  ?Diagnosis Date  ? Alzheimer disease ([place])   ? Atrial fibrillation ([place])   ? Hypertension   ? ?History reviewed. No pertinent surgical history. ? ?Allergies  ?Allergen Reactions  ? Bee Venom Other (See Comments)  ?  Listed on MAR  ? ? ?Outpatient Encounter Medications as of 09/15/2021  ?Medication Sig  ? apixaban (ELIQUIS) 5 MG TABS tablet Take 5 mg by mouth 2 (two) times daily.  ? bimatoprost (LUMIGAN) 0.01 % SOLN Place 1 drop into both eyes at bedtime.   ? Calcium Carb-Cholecalciferol (CALCIUM 600 + D PO) Take 1 tablet by mouth in the morning and at bedtime.  ? donepezil (ARICEPT) 5 MG tablet Take 5 mg by mouth at bedtime.  ? furosemide (LASIX) 20 MG tablet Take 20 mg by mouth daily.  ? lactose free nutrition (BOOST) LIQD Take 237 mLs by mouth 2 (two) times daily.  ? Metoprolol Succinate 50 MG CS24 Take 50 mg by mouth daily. Hold if Systolic is < 95 or if pulse is less than 60  ? Multiple Vitamins-Minerals (CENTRUM SILVER PO) Take 1  tablet by mouth daily.  ? zinc oxide 20 % ointment Apply 1 application topically as needed for irritation. Apply to buttocks after every incontinent episode and as needed for redness  ? [DISCONTINUED] loratadine (CLARITIN) 10 MG tablet Take 1 tablet (10 mg total) by mouth daily for 14 days.  ? ?No facility-administered encounter medications on file as of 09/15/2021.  ? ? ?Review of Systems  ?Unable to perform ROS: Dementia  ? ?Immunization History  ?Administered Date(s) Administered  ? Influenza-Unspecified  04/02/2014, 04/01/2016, 04/14/2017, 03/17/2020, 04/07/2021  ? Moderna SARS-COV2 Booster Vaccination 11/18/2020  ? Moderna Sars-Covid-2 Vaccination 06/17/2019, 07/15/2019, 04/27/2020  ? PFIZER(Purple Top)SARS-COV-2 Vaccination 03/03/2021  ? Pneumococcal Conjugate-13 03/24/2014  ? Pneumococcal Polysaccharide-23 02/26/2009  ? Tdap 03/08/2012, 12/23/2018  ? Zoster, Live 09/03/2007  ? ?Pertinent  Health Maintenance Due  ?Topic Date Due  ? DEXA SCAN  10/26/2021 (Originally 07/18/1992)  ? INFLUENZA VACCINE  01/11/2022  ? URINE MICROALBUMIN  Discontinued  ? ? ?  11/14/2019  ?  8:00 AM 11/14/2019  ? 10:00 PM 11/15/2019  ?  8:00 AM 11/16/2019  ?  8:00 AM 12/11/2020  ?  4:41 PM  ?Fall Risk  ?Falls in the past year?     1  ?Was there an injury with Fall?     0  ?Fall Risk Category Calculator     2  ?Fall Risk Category     Moderate  ?Patient Fall Risk Level High fall risk High fall risk High fall risk High fall risk High fall risk  ?Patient at Risk for Falls Due to     History of fall(s);Impaired balance/gait;Impaired mobility;Mental status change  ?Fall risk Follow up     Falls evaluation completed;Education provided;Falls prevention discussed  ? ?Functional Status Survey: ?  ? ?Vitals:  ? 09/15/21 0951  ?BP: 138/71  ?Pulse: (!) 57  ?Resp: (!) 21  ?Temp: (!) 97.5 ?F (36.4 ?C)  ?SpO2: 94%  ?Weight: 180 lb (81.6 kg)  ?Height: 5\' 1"  (1.549 m)  ? ?Body mass index is 34.01 kg/m?Marland Kitchen ?Physical Exam ?Vitals reviewed.  ?Constitutional:   ?   General: She is not in acute distress. ?HENT:  ?   Head: Normocephalic.  ?   Right Ear: There is no impacted cerumen.  ?   Left Ear: There is no impacted cerumen.  ?   Nose: Rhinorrhea present.  ?   Mouth/Throat:  ?   Mouth: Mucous membranes are moist.  ?Eyes:  ?   General:     ?   Right eye: No discharge.     ?   Left eye: No discharge.  ?Neck:  ?   Vascular: No carotid bruit.  ?Cardiovascular:  ?   Rate and Rhythm: Normal rate. Rhythm irregular.  ?   Pulses: Normal pulses.  ?   Heart sounds: Normal heart  sounds.  ?Pulmonary:  ?   Effort: Pulmonary effort is normal. No respiratory distress.  ?   Breath sounds: Normal breath sounds. No wheezing.  ?Abdominal:  ?   General: Bowel sounds are normal. There is no distension.  ?   Palpations: Abdomen is soft.  ?   Tenderness: There is no abdominal tenderness.  ?Musculoskeletal:  ?   Cervical back: Neck supple.  ?   Right lower leg: Edema present.  ?   Left lower leg: Edema present.  ?   Comments: Non pitting  ?Lymphadenopathy:  ?   Cervical: No cervical adenopathy.  ?Skin: ?   General: Skin is warm and  dry.  ?   Capillary Refill: Capillary refill takes less than 2 seconds.  ?Neurological:  ?   General: No focal deficit present.  ?   Mental Status: She is alert. Mental status is at baseline.  ?   Motor: Weakness present.  ?   Gait: Gait abnormal.  ?Psychiatric:     ?   Mood and Affect: Mood normal.     ?   Behavior: Behavior normal.     ?   Cognition and Memory: Memory is impaired.  ?   Comments: Very pleasant, follows commands, alert to self  ? ? ?Labs reviewed: ?Recent Labs  ?  09/19/20 ?1003 03/18/21 ?0000  ?NA 135 138  ?K 4.1 4.7  ?CL 99 102  ?CO2 29 26*  ?GLUCOSE 139*  --   ?BUN 20 29*  ?CREATININE 1.04* 1.0  ?CALCIUM 9.3 9.0  ? ?Recent Labs  ?  03/18/21 ?0000  ?AST 16  ?ALT 13  ?ALKPHOS 91  ?ALBUMIN 3.5  ? ?Recent Labs  ?  09/19/20 ?1003 03/18/21 ?0000  ?WBC 12.2* 8.2  ?NEUTROABS  --  5,609.00  ?HGB 12.9 11.1*  ?HCT 39.6 34*  ?MCV 92.3  --   ?PLT 286 250  ? ?Lab Results  ?Component Value Date  ? TSH 2.35 03/18/2021  ? ?Lab Results  ?Component Value Date  ? HGBA1C 5.3 11/13/2019  ? ?Lab Results  ?Component Value Date  ? CHOL 89 02/27/2020  ? HDL 46 02/27/2020  ? [place] 30 02/27/2020  ? TRIG 51 02/27/2020  ? CHOLHDL 2.2 11/13/2019  ? ? ?Significant Diagnostic Results in last 30 days:  ?No results found. ? ?Assessment/Plan ?1. Alzheimer disease ([place]) ?- no behavioral outbursts ?- cont skilled nursing care ?- cont Aricept ? ?2. Weight gain ?- cont monthly weights ?- cont  Boost ?- TSH ? ?3. Paroxysmal atrial fibrillation (HCC) ?- HR controlled with metoprolol ?- cont Eliquis for clot prevention ? ?4. Stage 3b chronic kidney disease ([place]) ?-Avoid nephrotoxic drugs like NSAIDS and dos

## 2021-09-17 LAB — HEPATIC FUNCTION PANEL
ALT: 11 U/L (ref 7–35)
AST: 14 (ref 13–35)
Bilirubin, Total: 0.7

## 2021-09-17 LAB — TSH: TSH: 3.05 (ref 0.41–5.90)

## 2021-09-17 LAB — BASIC METABOLIC PANEL
BUN: 27 — AB (ref 4–21)
CO2: 29 — AB (ref 13–22)
Chloride: 101 (ref 99–108)
Glucose: 87
Potassium: 4.3 mEq/L (ref 3.5–5.1)
Sodium: 137 (ref 137–147)

## 2021-09-17 LAB — CBC AND DIFFERENTIAL
HCT: 34 — AB (ref 36–46)
Hemoglobin: 10.9 — AB (ref 12.0–16.0)
Neutrophils Absolute: 4481
Platelets: 256 10*3/uL (ref 150–400)
WBC: 6.8

## 2021-09-17 LAB — COMPREHENSIVE METABOLIC PANEL
Albumin: 3.7 (ref 3.5–5.0)
Globulin: 2.3
eGFR: 50

## 2021-09-17 LAB — CBC: RBC: 3.6 — AB (ref 3.87–5.11)

## 2021-10-14 ENCOUNTER — Encounter: Payer: Self-pay | Admitting: Internal Medicine

## 2021-10-14 ENCOUNTER — Non-Acute Institutional Stay (SKILLED_NURSING_FACILITY): Payer: Medicare Other | Admitting: Internal Medicine

## 2021-10-14 DIAGNOSIS — N1832 Chronic kidney disease, stage 3b: Secondary | ICD-10-CM | POA: Diagnosis not present

## 2021-10-14 DIAGNOSIS — I480 Paroxysmal atrial fibrillation: Secondary | ICD-10-CM

## 2021-10-14 DIAGNOSIS — R600 Localized edema: Secondary | ICD-10-CM

## 2021-10-14 DIAGNOSIS — G309 Alzheimer's disease, unspecified: Secondary | ICD-10-CM

## 2021-10-14 DIAGNOSIS — F0280 Dementia in other diseases classified elsewhere without behavioral disturbance: Secondary | ICD-10-CM

## 2021-10-14 DIAGNOSIS — I63431 Cerebral infarction due to embolism of right posterior cerebral artery: Secondary | ICD-10-CM

## 2021-10-14 NOTE — Progress Notes (Signed)
?Location:   [facility] Room Number: 7A ?Place of Service:  SNF (31) ?Provider:  Veleta Miners, MD ? ?Virgie Dad, MD ? ?Patient Care Team: ?Virgie Dad, MD as PCP - General (Internal Medicine) ? ?Extended Emergency Contact Information ?Primary Emergency Contact: [name] ?Address: [address] ?         [place], [place] [address] [place] ?Home Phone: [phone] ?Relation: Son ? ?Code Status:  DNR ?Goals of care: Advanced Directive information ? ?  10/14/2021  ? 10:10 AM  ?Advanced Directives  ?Does Patient Have a Medical Advance Directive? Yes  ?Type of Paramedic of [place];Living will;Out of facility DNR (pink MOST or yellow form)  ?Does patient want to make changes to medical advance directive? No - Patient declined  ?Copy of [place] in Chart? Yes - validated most recent copy scanned in chart (See row information)  ?Pre-existing out of facility DNR order (yellow form or pink MOST form) Yellow form placed in chart (order not valid for inpatient use);Pink MOST form placed in chart (order not valid for inpatient use)  ? ? ? ?Chief Complaint  ?Patient presents with  ? Medical Management of Chronic Issues  ?  Routine follow up visit.  ? ? ?HPI:  ?Pt is a 88 y.o. female seen today for medical management of chronic diseases.   ?Long term resident of SNF ?H/o right PCA infarct.  Has been on Eliquis since then.   ?Also has a history of Alzheimer's dementia,  ?hypertension, ? A. fib.  Her echo had a EF of 60 to 65% with LA dilated ?CT scan showed Temporal Atrophy and Chronic Cerebral Infarcts ? ?  ? ?She is stable. No new Nursing issues. No Behavior issues ?Walks with her walker and Mild assist ?Needs Help with her ADLS and Transfers ?No Falls ?Wt Readings from Last 3 Encounters:  ?10/14/21 184 lb 8 oz (83.7 kg)  ?09/15/21 180 lb (81.6 kg)  ?08/16/21 175 lb (79.4 kg)  ? ? ?Past Medical History:  ?Diagnosis Date  ?  Alzheimer disease ([place])   ? Atrial fibrillation ([place])   ? Hypertension   ? ?History reviewed. No pertinent surgical history. ? ?Allergies  ?Allergen Reactions  ? Bee Venom Other (See Comments)  ?  Listed on MAR  ? ? ?Allergies as of 10/14/2021   ? ?   Reactions  ? Bee Venom Other (See Comments)  ? Listed on MAR  ? ?  ? ?  ?Medication List  ?  ? ?  ? Accurate as of Oct 14, 2021 10:11 AM. If you have any questions, ask your nurse or doctor.  ?  ?  ? ?  ? ?apixaban 5 MG Tabs tablet ?Commonly known as: ELIQUIS ?Take 5 mg by mouth 2 (two) times daily. ?  ?bimatoprost 0.01 % Soln ?Commonly known as: LUMIGAN ?Place 1 drop into both eyes at bedtime. ?  ?CALCIUM 600 + D PO ?Take 1 tablet by mouth in the morning and at bedtime. ?  ?CENTRUM SILVER PO ?Take 1 tablet by mouth daily. ?  ?donepezil 5 MG tablet ?Commonly known as: ARICEPT ?Take 5 mg by mouth at bedtime. ?  ?furosemide 20 MG tablet ?Commonly known as: LASIX ?Take 20 mg by mouth daily. ?  ?lactose free nutrition Liqd ?Take 237 mLs by mouth 2 (two) times daily. ?  ?Metoprolol Succinate 50 MG Cs24 ?Take 50 mg by mouth daily. Hold if Systolic is <  95 or if pulse is less than 60 ?  ?zinc oxide 20 % ointment ?Apply 1 application topically as needed for irritation. Apply to buttocks after every incontinent episode and as needed for redness ?  ? ?  ? ? ?Review of Systems  ?Unable to perform ROS: Dementia  ? ?Immunization History  ?Administered Date(s) Administered  ? Influenza-Unspecified 04/02/2014, 04/01/2016, 04/14/2017, 03/17/2020, 04/07/2021  ? Moderna SARS-COV2 Booster Vaccination 11/18/2020  ? Moderna Sars-Covid-2 Vaccination 06/17/2019, 07/15/2019, 04/27/2020  ? PFIZER(Purple Top)SARS-COV-2 Vaccination 03/03/2021  ? Pneumococcal Conjugate-13 03/24/2014  ? Pneumococcal Polysaccharide-23 02/26/2009  ? Tdap 03/08/2012, 12/23/2018  ? Zoster, Live 09/03/2007  ? ?Pertinent  Health Maintenance Due  ?Topic Date Due  ? DEXA SCAN  10/26/2021 (Originally 07/18/1992)  ? INFLUENZA  VACCINE  01/11/2022  ? URINE MICROALBUMIN  Discontinued  ? ? ?  11/14/2019  ?  8:00 AM 11/14/2019  ? 10:00 PM 11/15/2019  ?  8:00 AM 11/16/2019  ?  8:00 AM 12/11/2020  ?  4:41 PM  ?Fall Risk  ?Falls in the past year?     1  ?Was there an injury with Fall?     0  ?Fall Risk Category Calculator     2  ?Fall Risk Category     Moderate  ?Patient Fall Risk Level High fall risk High fall risk High fall risk High fall risk High fall risk  ?Patient at Risk for Falls Due to     History of fall(s);Impaired balance/gait;Impaired mobility;Mental status change  ?Fall risk Follow up     Falls evaluation completed;Education provided;Falls prevention discussed  ? ?Functional Status Survey: ?  ? ?Vitals:  ? 10/14/21 1005  ?BP: 133/73  ?Pulse: 70  ?Resp: 20  ?Temp: (!) 96.9 ?F (36.1 ?C)  ?SpO2: 95%  ?Weight: 184 lb 8 oz (83.7 kg)  ?Height: 5\' 1"  (1.549 m)  ? ?Body mass index is 34.86 kg/m?Marland Kitchen ?Physical Exam ?Vitals reviewed.  ?Constitutional:   ?   Appearance: Normal appearance.  ?HENT:  ?   Head: Normocephalic.  ?   Nose: Nose normal.  ?   Mouth/Throat:  ?   Mouth: Mucous membranes are moist.  ?   Pharynx: Oropharynx is clear.  ?Eyes:  ?   Pupils: Pupils are equal, round, and reactive to light.  ?Cardiovascular:  ?   Rate and Rhythm: Normal rate and regular rhythm.  ?   Pulses: Normal pulses.  ?   Heart sounds: Normal heart sounds. No murmur heard. ?Pulmonary:  ?   Effort: Pulmonary effort is normal.  ?   Breath sounds: Normal breath sounds.  ?Abdominal:  ?   General: Abdomen is flat. Bowel sounds are normal.  ?   Palpations: Abdomen is soft.  ?Musculoskeletal:     ?   General: No swelling.  ?   Cervical back: Neck supple.  ?Skin: ?   General: Skin is warm.  ?Neurological:  ?   General: No focal deficit present.  ?   Mental Status: She is alert.  ?Psychiatric:     ?   Mood and Affect: Mood normal.     ?   Thought Content: Thought content normal.  ? ? ?Labs reviewed: ?Recent Labs  ?  03/18/21 ?0000  ?NA 138  ?K 4.7  ?CL 102  ?CO2 26*  ?BUN 29*   ?CREATININE 1.0  ?CALCIUM 9.0  ? ?Recent Labs  ?  03/18/21 ?0000  ?AST 16  ?ALT 13  ?ALKPHOS 91  ?ALBUMIN 3.5  ? ?Recent  Labs  ?  03/18/21 ?0000  ?WBC 8.2  ?NEUTROABS 5,609.00  ?HGB 11.1*  ?HCT 34*  ?PLT 250  ? ?Lab Results  ?Component Value Date  ? TSH 2.35 03/18/2021  ? ?Lab Results  ?Component Value Date  ? HGBA1C 5.3 11/13/2019  ? ?Lab Results  ?Component Value Date  ? CHOL 89 02/27/2020  ? HDL 46 02/27/2020  ? [place] 30 02/27/2020  ? TRIG 51 02/27/2020  ? CHOLHDL 2.2 11/13/2019  ? ? ?Significant Diagnostic Results in last 30 days:  ?No results found. ? ?Assessment/Plan ?1. Alzheimer disease ([place]) ?On Aricept ?Stable in SNF ? ?2. Paroxysmal atrial fibrillation (HCC) ?On Eliquis and Metoprolol ? ?3. Stage 3a chronic kidney disease ([place]) ?Creat stays stable ? ?4. Bilateral lower extremity edema ?On Lasix ? ?5. Cerebrovascular accident (CVA) due to embolism of right posterior cerebral artery ([place]) ?On Eliquis ?Off statin per her daughter ? ? ? ?Family/ staff Communication:  ? ?Labs/tests ordered:   ? ?  ?

## 2021-11-17 ENCOUNTER — Non-Acute Institutional Stay (SKILLED_NURSING_FACILITY): Payer: Medicare Other | Admitting: Orthopedic Surgery

## 2021-11-17 ENCOUNTER — Encounter: Payer: Self-pay | Admitting: Orthopedic Surgery

## 2021-11-17 DIAGNOSIS — F0280 Dementia in other diseases classified elsewhere without behavioral disturbance: Secondary | ICD-10-CM

## 2021-11-17 DIAGNOSIS — N1832 Chronic kidney disease, stage 3b: Secondary | ICD-10-CM

## 2021-11-17 DIAGNOSIS — I63431 Cerebral infarction due to embolism of right posterior cerebral artery: Secondary | ICD-10-CM

## 2021-11-17 DIAGNOSIS — R635 Abnormal weight gain: Secondary | ICD-10-CM

## 2021-11-17 DIAGNOSIS — G309 Alzheimer's disease, unspecified: Secondary | ICD-10-CM

## 2021-11-17 DIAGNOSIS — D631 Anemia in chronic kidney disease: Secondary | ICD-10-CM

## 2021-11-17 DIAGNOSIS — I480 Paroxysmal atrial fibrillation: Secondary | ICD-10-CM | POA: Diagnosis not present

## 2021-11-17 DIAGNOSIS — E782 Mixed hyperlipidemia: Secondary | ICD-10-CM

## 2021-11-17 DIAGNOSIS — D692 Other nonthrombocytopenic purpura: Secondary | ICD-10-CM

## 2021-11-17 DIAGNOSIS — N183 Chronic kidney disease, stage 3 unspecified: Secondary | ICD-10-CM

## 2021-11-17 DIAGNOSIS — R600 Localized edema: Secondary | ICD-10-CM

## 2021-11-17 NOTE — Progress Notes (Signed)
Location:  [facility] Room Number: [place] of Service:  SNF [phone]) Provider:  Yvonna Alanis, NP   Virgie Dad, MD  Patient Care Team: Virgie Dad, MD as PCP - General (Internal Medicine)  Extended Emergency Contact Information Primary Emergency Contact: [name] Address: [address]          [place], [place] [address] Johnnette Litter of [place] Phone: [phone] Relation: Son  Code Status:  DNR Goals of care: Advanced Directive information    10/14/2021   10:10 AM  Advanced Directives  Does Patient Have a Medical Advance Directive? Yes  Type of Paramedic of [place];Living will;Out of facility DNR (pink MOST or yellow form)  Does patient want to make changes to medical advance directive? No - Patient declined  Copy of [place] in Chart? Yes - validated most recent copy scanned in chart (See row information)  Pre-existing out of facility DNR order (yellow form or pink MOST form) Yellow form placed in chart (order not valid for inpatient use);Pink MOST form placed in chart (order not valid for inpatient use)     Chief Complaint  Patient presents with   Medical Management of Chronic Issues    HPI:  Pt is a 88 y.o. female seen today for medical management of chronic diseases.    She continues to reside on the skilled nursing unit at [facility]. Past medical history includes: CVA, hypertension, atrial fibrillation, Alzheimer's, CKD stage 3, hyperlipidemia and anemia related to CKD.   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 Ambulates with wheelchair.   Recent blood pressures:  06/06- 132/61, 155/74  06/05- 151/71  06/04- 163/81  Recent weights:  06/01- 180 lbs  05/03- 184.5 lbs  04/05- 178 lbs   Past Medical History:  Diagnosis Date   Alzheimer disease ([place])    Atrial fibrillation ([place])    Hypertension    No past surgical history on file.  Allergies  Allergen Reactions   Bee Venom Other (See Comments)    Listed on [facility]    Outpatient Encounter Medications as of 11/17/2021  Medication Sig   apixaban (ELIQUIS) 5 MG TABS tablet Take 5 mg by mouth 2 (two) times daily.   bimatoprost (LUMIGAN) 0.01 % SOLN Place 1 drop into both eyes at bedtime.    Calcium Carb-Cholecalciferol (CALCIUM 600 + D PO) Take 1 tablet by mouth in the morning and at bedtime.   donepezil (ARICEPT) 5 MG tablet Take 5 mg by mouth at bedtime.   furosemide (LASIX) 20 MG tablet Take 20 mg by mouth daily.   lactose free nutrition (BOOST) LIQD Take 237 mLs by mouth 2 (two) times daily.   Metoprolol Succinate 50 MG CS24 Take 50 mg by mouth daily. Hold if Systolic is < 95 or if pulse is less than 60   Multiple Vitamins-Minerals (CENTRUM  SILVER PO) Take 1 tablet by mouth daily.   zinc oxide 20 % ointment Apply 1 application topically as needed for irritation. Apply to buttocks after every incontinent episode and as needed for redness   No facility-administered encounter medications on file as of 11/17/2021.    Review of Systems  Unable to perform ROS: Dementia   Immunization History  Administered Date(s) Administered   Influenza-Unspecified 04/02/2014, 04/01/2016, 04/14/2017, 03/17/2020, 04/07/2021   Moderna SARS-COV2 Booster Vaccination 11/18/2020    Moderna Sars-Covid-2 Vaccination 06/17/2019, 07/15/2019, 04/27/2020   PFIZER(Purple Top)SARS-COV-2 Vaccination 03/03/2021   Pneumococcal Conjugate-13 03/24/2014   Pneumococcal Polysaccharide-23 02/26/2009   Tdap 03/08/2012, 12/23/2018   Zoster, Live 09/03/2007   Pertinent  Health Maintenance Due  Topic Date Due   DEXA SCAN  Never done   INFLUENZA VACCINE  01/11/2022   URINE MICROALBUMIN  Discontinued      11/14/2019    8:00 AM 11/14/2019   10:00 PM 11/15/2019    8:00 AM 11/16/2019    8:00 AM 12/11/2020    4:41 PM  Fall Risk  Falls in the past year?     1  Was there an injury with Fall?     0  Fall Risk Category Calculator     2  Fall Risk Category     Moderate  Patient Fall Risk Level High fall risk High fall risk High fall risk High fall risk High fall risk  Patient at Risk for Falls Due to     History of fall(s);Impaired balance/gait;Impaired mobility;Mental status change  Fall risk Follow up     Falls evaluation completed;Education provided;Falls prevention discussed   Functional Status Survey:    Vitals:   11/17/21 1120  BP: 132/61  Pulse: 65  Resp: 16  Temp: (!) 96 F (35.6 C)  SpO2: 100%  Weight: 180 lb (81.6 kg)  Height: 5\' 1"  (1.549 m)   Body mass index is 34.01 kg/m. Physical Exam Vitals reviewed.  Constitutional:      General: She is not in acute distress. HENT:     Right Ear: There is no impacted cerumen.     Left Ear: There is no impacted cerumen.     Nose: Nose normal.     Mouth/Throat:     Mouth: Mucous membranes are moist.     Comments: Dentures Eyes:     General:        Right eye: No discharge.        Left eye: No discharge.  Cardiovascular:     Rate and Rhythm: Normal rate. Rhythm irregular.     Pulses: Normal pulses.     Heart sounds: Normal heart sounds.  Pulmonary:     Effort: Pulmonary effort is normal. No respiratory distress.     Breath sounds: Normal breath sounds. No wheezing.  Abdominal:     General: Bowel sounds are normal. There  is no distension.     Palpations: Abdomen is soft.     Tenderness: There is no abdominal tenderness.  Musculoskeletal:     Cervical back: Neck supple.     Right lower leg: Edema present.     Left lower leg: Edema present.     Comments: Non pitting  Skin:    General: Skin is warm and dry.     Capillary Refill: Capillary refill takes less than 2 seconds.     Comments: Small , non tender lesions to extremities, vary in size, no skin breakdown or infection  Neurological:  General: No focal deficit present.     Mental Status: She is alert. Mental status is at baseline.     Motor: Weakness present.     Gait: Gait abnormal.  Psychiatric:        Mood and Affect: Mood normal.        Behavior: Behavior normal.        Cognition and Memory: Cognition is impaired. Memory is impaired.     Comments: Pleasant mood, follows some commands, aphasia, alert to self only    Labs reviewed: Recent Labs    03/18/21 0000 09/17/21 0000  NA 138 137  K 4.7 4.3  CL 102 101  CO2 26* 29*  BUN 29* 27*  CREATININE 1.0  --   CALCIUM 9.0  --    Recent Labs    03/18/21 0000 09/17/21 0000  AST 16 14  ALT 13 11  ALKPHOS 91  --   ALBUMIN 3.5 3.7   Recent Labs    03/18/21 0000 09/17/21 0000  WBC 8.2 6.8  NEUTROABS 5,609.00 4,481.00  HGB 11.1* 10.9*  HCT 34* 34*  PLT 250 256   Lab Results  Component Value Date   TSH 3.05 09/17/2021   Lab Results  Component Value Date   HGBA1C 5.3 11/13/2019   Lab Results  Component Value Date   CHOL 89 02/27/2020   HDL 46 02/27/2020   LDLCALC 30 02/27/2020   TRIG 51 02/27/2020   CHOLHDL 2.2 11/13/2019    Significant Diagnostic Results in last 30 days:  No results found.  Assessment/Plan 1. Paroxysmal atrial fibrillation (HCC) - HR controlled with metoprolol - cont Eliquis for clot prevention  2. Alzheimer disease ([place]) - no behavioral outbursts - needs assistance with ADLs and transfers - cont Aricept - cont skilled nursing care  3.  Weight gain - weights stable - cont monthly weights - cont Boost  4. Stage 3b chronic kidney disease ([place]) -avoid nephrotoxic drugs like NSAIDS and dose adjust medications to be renally excreted -encourage hydration with water   5. Mixed hyperlipidemia - not on statin per HPOA  6. Bilateral lower extremity edema - non pitting today - sits in wheelchair or chair most of day - cont furosemide  7. Anemia due to stage 3 chronic kidney disease, unspecified whether stage 3a or 3b CKD (HCC) - hgb stable  8. Cerebrovascular accident (CVA) due to embolism of right posterior cerebral artery ([place]) - h/o right PCA infarct 11/2019 - remains on Eliquis  9. Senile purpura (HCC) - multiple non tender purple lesions on extremities - education given to patient and nursing staff     Family/ staff Communication: plan discussed with patient and nurse  Labs/tests ordered:  none

## 2021-12-17 ENCOUNTER — Encounter: Payer: Self-pay | Admitting: Orthopedic Surgery

## 2021-12-17 ENCOUNTER — Non-Acute Institutional Stay (SKILLED_NURSING_FACILITY): Payer: Medicare Other | Admitting: Orthopedic Surgery

## 2021-12-17 DIAGNOSIS — R600 Localized edema: Secondary | ICD-10-CM

## 2021-12-17 DIAGNOSIS — D631 Anemia in chronic kidney disease: Secondary | ICD-10-CM

## 2021-12-17 DIAGNOSIS — I480 Paroxysmal atrial fibrillation: Secondary | ICD-10-CM | POA: Diagnosis not present

## 2021-12-17 DIAGNOSIS — N1832 Chronic kidney disease, stage 3b: Secondary | ICD-10-CM | POA: Diagnosis not present

## 2021-12-17 DIAGNOSIS — R635 Abnormal weight gain: Secondary | ICD-10-CM

## 2021-12-17 DIAGNOSIS — G309 Alzheimer's disease, unspecified: Secondary | ICD-10-CM | POA: Diagnosis not present

## 2021-12-17 DIAGNOSIS — F0280 Dementia in other diseases classified elsewhere without behavioral disturbance: Secondary | ICD-10-CM

## 2021-12-17 DIAGNOSIS — I63431 Cerebral infarction due to embolism of right posterior cerebral artery: Secondary | ICD-10-CM

## 2021-12-17 DIAGNOSIS — E782 Mixed hyperlipidemia: Secondary | ICD-10-CM

## 2021-12-17 DIAGNOSIS — N183 Anemia in chronic kidney disease: Secondary | ICD-10-CM

## 2021-12-17 NOTE — Progress Notes (Signed)
Location:  [facility] Room Number: NO/07/A Place of Service:  SNF (31) Provider:  Octavia Heir NP  Patient Care Team: Mahlon Gammon, MD as PCP - General (Internal Medicine)  Extended Emergency Contact Information Primary Emergency Contact: Marval Regal Address: [address]          [place], [place] [address] Darden Amber of [place] Home Phone: [phone] Relation: Son  Code Status:  DNR Goals of care: Advanced Directive information    12/17/2021    9:34 AM  Advanced Directives  Does Patient Have a Medical Advance Directive? Yes  Type of Advance Directive Out of facility DNR (pink MOST or yellow form)  Does patient want to make changes to medical advance directive? No - Patient declined  Pre-existing out of facility DNR order (yellow form or pink MOST form) Pink MOST form placed in chart (order not valid for inpatient use)     Chief Complaint  Patient presents with   Medical Management of Chronic Issues    Patient is here for a follow up for chronic conditions     HPI:  Pt is a 87 y.o. female seen today for medical management of chronic diseases.    She continues to reside on the skilled nursing unit at [facility]. Past medical history includes: CVA, hypertension, atrial fibrillation, Alzheimer's, CKD stage 3, hyperlipidemia and anemia related to CKD.    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   Recent blood pressures:  07/07- 111/59  07/06- 153/83  07/05- 113/60  Recent weights:  07/04- 170 lbs  06/01- 180 lbs  04/05- 178 lbs     Past Medical History:  Diagnosis Date   Alzheimer disease (HCC)    Atrial fibrillation (HCC)    Hypertension    History reviewed. No pertinent surgical history.  Allergies  Allergen Reactions   Bee Venom Other (See Comments)    Listed on [facility]    Outpatient Encounter Medications as of 12/17/2021  Medication Sig   apixaban (ELIQUIS) 5 MG TABS tablet Take 5 mg by mouth 2 (two) times daily.   bimatoprost (LUMIGAN) 0.01 % SOLN Place 1 drop into both eyes at bedtime.    Calcium Carb-Cholecalciferol (CALCIUM 600 + D PO) Take 1 tablet by mouth in the morning and at bedtime.   donepezil (ARICEPT) 5 MG tablet Take 5 mg by mouth at bedtime.   furosemide (LASIX) 20 MG tablet Take 20 mg by mouth daily.   lactose free nutrition (BOOST) LIQD Take 237 mLs by mouth 2 (two) times daily.   Metoprolol Succinate 50 MG CS24 Take 50 mg by mouth daily. Hold if Systolic is < 95 or if pulse is less than 60   Multiple Vitamins-Minerals (CENTRUM SILVER PO) Take 1 tablet by mouth daily.   zinc  oxide 20 % ointment Apply 1 application topically as needed for irritation. Apply to buttocks after every incontinent episode and as needed for redness   No facility-administered encounter medications on file as of 12/17/2021.    Review of Systems  Unable to perform ROS: Dementia    Immunization History  Administered Date(s) Administered   Influenza-Unspecified 04/02/2014, 04/01/2016, 04/14/2017, 03/17/2020, 04/07/2021   Moderna SARS-COV2 Booster Vaccination 11/18/2020   Moderna Sars-Covid-2 Vaccination 06/17/2019, 07/15/2019, 04/27/2020   PFIZER(Purple  Top)SARS-COV-2 Vaccination 03/03/2021   Pneumococcal Conjugate-13 03/24/2014   Pneumococcal Polysaccharide-23 02/26/2009   Tdap 03/08/2012, 12/23/2018   Zoster, Live 09/03/2007   Pertinent  Health Maintenance Due  Topic Date Due   DEXA SCAN  Never done   INFLUENZA VACCINE  01/11/2022      11/14/2019    8:00 AM 11/14/2019   10:00 PM 11/15/2019    8:00 AM 11/16/2019    8:00 AM 12/11/2020    4:41 PM  Fall Risk  Falls in the past year?     1  Was there an injury with Fall?     0  Fall Risk Category Calculator     2  Fall Risk Category     Moderate  Patient Fall Risk Level High fall risk High fall risk High fall risk High fall risk High fall risk  Patient at Risk for Falls Due to     History of fall(s);Impaired balance/gait;Impaired mobility;Mental status change  Fall risk Follow up     Falls evaluation completed;Education provided;Falls prevention discussed   Functional Status Survey:    Vitals:   12/17/21 0933  BP: (!) 153/83  Pulse: 70  Resp: 16  Temp: (!) 97.2 F (36.2 C)  SpO2: 99%  Weight: 170 lb (77.1 kg)  Height: 5\' 1"  (1.549 m)   Body mass index is 32.12 kg/m. Physical Exam Vitals reviewed.  Constitutional:      General: She is not in acute distress. HENT:     Head: Normocephalic.     Right Ear: There is no impacted cerumen.     Left Ear: There is no impacted cerumen.     Nose: Nose normal.     Mouth/Throat:     Mouth: Mucous membranes are moist.     Pharynx: No oropharyngeal exudate or posterior oropharyngeal erythema.     Comments: Dentures Eyes:     General:        Right eye: No discharge.        Left eye: No discharge.  Cardiovascular:     Rate and Rhythm: Normal rate. Rhythm irregular.     Pulses: Normal pulses.     Heart sounds: Normal heart sounds.  Pulmonary:     Effort: Pulmonary effort is normal. No respiratory distress.     Breath sounds: Normal breath sounds. No wheezing.  Abdominal:     General: Bowel sounds are normal. There is no  distension.     Palpations: Abdomen is soft.     Tenderness: There is no abdominal tenderness.  Musculoskeletal:     Cervical back: Neck supple.     Right lower leg: No edema.     Left lower leg: No edema.  Skin:    General: Skin is warm and dry.     Capillary Refill: Capillary refill takes less than 2 seconds.  Neurological:     General: No focal deficit present.     Mental Status: She is alert. Mental status is at baseline.     Motor:  Weakness present.     Gait: Gait abnormal.     Comments: Walker/wheelchair  Psychiatric:        Mood and Affect: Mood normal.        Behavior: Behavior normal.        Cognition and Memory: Cognition is impaired. Memory is impaired.     Comments: Very pleasant, follows commands sometimes, alert to self and familiar face     Labs reviewed: Recent Labs    03/18/21 0000 09/17/21 0000  NA 138 137  K 4.7 4.3  CL 102 101  CO2 26* 29*  BUN 29* 27*  CREATININE 1.0  --   CALCIUM 9.0  --    Recent Labs    03/18/21 0000 09/17/21 0000  AST 16 14  ALT 13 11  ALKPHOS 91  --   ALBUMIN 3.5 3.7   Recent Labs    03/18/21 0000 09/17/21 0000  WBC 8.2 6.8  NEUTROABS 5,609.00 4,481.00  HGB 11.1* 10.9*  HCT 34* 34*  PLT 250 256   Lab Results  Component Value Date   TSH 3.05 09/17/2021   Lab Results  Component Value Date   HGBA1C 5.3 11/13/2019   Lab Results  Component Value Date   CHOL 89 02/27/2020   HDL 46 02/27/2020   LDLCALC 30 02/27/2020   TRIG 51 02/27/2020   CHOLHDL 2.2 11/13/2019    Significant Diagnostic Results in last 30 days:  No results found.  Assessment/Plan 1. Alzheimer disease ([place]) - no behaviors - dependent with ADLs - putting food in water cup at times- I did not observe today - cont Aricept - cont skilled nursing care  2. Paroxysmal atrial fibrillation (HCC) - HR controlled with metoprolol - cont Eliquis for clot prevention  3. Weight gain - fluctuates between 10 lbs - cont Boost  4. Stage 3b  chronic kidney disease ([place]) -avoid nephrotoxic drugs like NSAIDS and dose adjust medications to be renally excreted -encourage hydration with water   5. Mixed hyperlipidemia - off statin per HPOA  6. Bilateral lower extremity edema - sits most of day - recommend leg elevation   7. Anemia due to stage 3 chronic kidney disease, unspecified whether stage 3a or 3b CKD (HCC) - hgb stable  8. Cerebrovascular accident (CVA) due to embolism of right posterior cerebral artery ([place]) - H/o right PCA infarct 11/2019 - cont Eliquis    Family/ staff Communication: plan discussed with patient and nurse  Labs/tests ordered:  none

## 2021-12-22 ENCOUNTER — Non-Acute Institutional Stay (SKILLED_NURSING_FACILITY): Payer: Medicare Other | Admitting: Orthopedic Surgery

## 2021-12-22 ENCOUNTER — Encounter: Payer: Self-pay | Admitting: Orthopedic Surgery

## 2021-12-22 DIAGNOSIS — Z0000 Encounter for general adult medical examination without abnormal findings: Secondary | ICD-10-CM | POA: Diagnosis not present

## 2021-12-22 NOTE — Progress Notes (Addendum)
Subjective:   Jasmine Woodward is a 88 y.o. female who presents for Medicare Annual (Subsequent) preventive examination.  Place of Service: [facility]  Provider: Windell Moulding, AGNP-C   Review of Systems     Cardiac Risk Factors include: advanced age (>80men, >62 women);sedentary lifestyle;hypertension;dyslipidemia;obesity (BMI >30kg/m2)     Objective:    Today's Vitals   12/22/21 1059  BP: 139/70  Pulse: 69  Resp: 20  Temp: (!) 97.1 F (36.2 C)  SpO2: 96%  Weight: 171 lb (77.6 kg)  Height: 5\' 1"  (1.549 m)   Body mass index is 32.31 kg/m.     12/22/2021   11:06 AM 12/17/2021    9:34 AM 10/14/2021   10:10 AM 09/15/2021    9:53 AM 08/16/2021    9:50 AM 08/11/2021   11:22 AM 07/22/2021   10:23 AM  Advanced Directives  Does Patient Have a Medical Advance Directive? Yes Yes Yes Yes Yes Yes Yes  Type of Advance Directive Out of facility DNR (pink MOST or yellow form) Out of facility DNR (pink MOST or yellow form) [place];Living will;Out of facility DNR (pink MOST or yellow form) [place];Living will;Out of facility DNR (pink MOST or yellow form) [place];Living will;Out of facility DNR (pink MOST or yellow form) [place];Living will;Out of facility DNR (pink MOST or yellow form) [place];Living will;Out of facility DNR (pink MOST or yellow form)  Does patient want to make changes to medical advance directive? No - Patient declined No - Patient declined No - Patient declined No - Patient declined No - Patient declined No - Patient declined No - Patient declined  Copy of [place] in Chart?   Yes - validated most recent copy scanned in chart (See row information) Yes - validated most recent copy scanned in chart (See row information) Yes - validated most recent copy scanned in chart (See row information) Yes - validated most recent copy scanned in chart  (See row information) Yes - validated most recent copy scanned in chart (See row information)  Pre-existing out of facility DNR order (yellow form or pink MOST form) Pink MOST form placed in chart (order not valid for inpatient use) Pink MOST form placed in chart (order not valid for inpatient use) Yellow form placed in chart (order not valid for inpatient use);Pink MOST form placed in chart (order not valid for inpatient use) Pink MOST form placed in chart (order not valid for inpatient use);Yellow form placed in chart (order not valid for inpatient use) Pink MOST form placed in chart (order not valid for inpatient use);Yellow form placed in chart (order not valid for inpatient use) Pink MOST form placed in chart (order not valid for inpatient use);Yellow form placed in chart (order not valid for inpatient use) Pink MOST form placed in chart (order not valid for inpatient use);Yellow form placed in chart (order not valid for inpatient use)    Current Medications (verified) Outpatient Encounter Medications as of 12/22/2021  Medication Sig   apixaban (ELIQUIS) 5 MG TABS tablet Take 5 mg by mouth 2 (two) times daily.   bimatoprost (LUMIGAN) 0.01 % SOLN Place 1 drop into both eyes at bedtime.    Calcium Carb-Cholecalciferol (CALCIUM 600 + D PO) Take 1 tablet by mouth in the morning and at bedtime.   donepezil (ARICEPT) 5 MG tablet Take 5 mg by mouth at bedtime.   furosemide (LASIX)  20 MG tablet Take 20 mg by mouth daily.   lactose free nutrition (BOOST) LIQD Take 237 mLs by mouth 2 (two) times daily.   Metoprolol Succinate 50 MG CS24 Take 50 mg by mouth daily. Hold if Systolic is < 95 or if pulse is less than 60   Multiple Vitamins-Minerals (CENTRUM SILVER PO) Take 1 tablet by mouth daily.   zinc oxide 20 % ointment Apply 1 application topically as needed for irritation. Apply to buttocks after every incontinent episode and as needed for redness   No facility-administered encounter medications on file  as of 12/22/2021.    Allergies (verified) Bee venom   History: Past Medical History:  Diagnosis Date   Alzheimer disease (HCC)    Atrial fibrillation (HCC)    Hypertension    History reviewed. No pertinent surgical history. History reviewed. No pertinent family history. Social History   Socioeconomic History   Marital status: Single    Spouse name: Not on file   Number of children: Not on file   Years of education: Not on file   Highest education level: Not on file  Occupational History   Not on file  Tobacco Use   Smoking status: Never   Smokeless tobacco: Never  Vaping Use   Vaping Use: Never used  Substance and Sexual Activity   Alcohol use: Not Currently   Drug use: Never   Sexual activity: Not Currently  Other Topics Concern   Not on file  Social History Narrative   ** Merged History Encounter **       Social Determinants of Health   Financial Resource Strain: Low Risk  (12/22/2021)   Overall Financial Resource Strain (CARDIA)    Difficulty of Paying Living Expenses: Not hard at all  Food Insecurity: Not on file  Transportation Needs: No Transportation Needs (12/22/2021)   PRAPARE - Administrator, Civil Service (Medical): No    Lack of Transportation (Non-Medical): No  Physical Activity: Inactive (12/22/2021)   Exercise Vital Sign    Days of Exercise per Week: 0 days    Minutes of Exercise per Session: 0 min  Stress: Unknown (12/22/2021)   [employer] of Occupational Health - Occupational Stress Questionnaire    Feeling of Stress : Patient refused  Social Connections: Unknown (12/22/2021)   Social Connection and Isolation Panel [NHANES]    Frequency of Communication with Friends and Family: Patient refused    Frequency of Social Gatherings with Friends and Family: Patient refused    Attends Religious Services: Patient refused    Active Member of Clubs or Organizations: Patient refused    Attends Banker Meetings: Patient  refused    Marital Status: Widowed    Tobacco Counseling Counseling given: Not Answered   Clinical Intake:  Pre-visit preparation completed: Yes  Pain : Faces Faces Pain Scale: No hurt  Faces Pain Scale: No hurt  BMI - recorded: 32.31 Nutritional Status: BMI > 30  Obese Nutritional Risks: None Diabetes: No  How often do you need to have someone help you when you read instructions, pamphlets, or other written materials from your doctor or pharmacy?: 5 - Always What is the last grade level you completed in school?: unable to answer  Diabetic?No  Interpreter Needed?: No      Activities of Daily Living    12/22/2021    2:27 PM  In your present state of health, do you have any difficulty performing the following activities:  Hearing? 0  Vision?  0  Difficulty concentrating or making decisions? 1  Walking or climbing stairs? 1  Dressing or bathing? 1  Doing errands, shopping? 1  Preparing Food and eating ? Y  Using the Toilet? Y  In the past six months, have you accidently leaked urine? Y  Do you have problems with loss of bowel control? Y  Managing your Medications? Y  Managing your Finances? Y  Housekeeping or managing your Housekeeping? Y    Patient Care Team: Mahlon Gammon, MD as PCP - General (Internal Medicine)  Indicate any recent Medical Services you may have received from other than [facility] providers in the past year (date may be approximate).     Assessment:   This is a routine wellness examination for Jasmine Woodward.  Hearing/Vision screen No results found.  Dietary issues and exercise activities discussed: Current Exercise Habits: The patient does not participate in regular exercise at present, Exercise limited by: cardiac condition(s);neurologic condition(s)   Goals Addressed             This Visit's Progress    Have 3 meals a day   On track      Depression Screen    12/11/2020    4:41 PM  PHQ 2/9 Scores  PHQ - 2 Score 0    Fall  Risk    12/22/2021    2:26 PM 12/11/2020    4:41 PM  Fall Risk   Falls in the past year? 1 1  Number falls in past yr: 0 1  Injury with Fall? 0 0  Risk for fall due to : History of fall(s);Impaired balance/gait History of fall(s);Impaired balance/gait;Impaired mobility;Mental status change  Risk for fall due to: Comment Alzheimers dementia   Follow up Falls evaluation completed;Education provided;Falls prevention discussed Falls evaluation completed;Education provided;Falls prevention discussed    FALL RISK PREVENTION PERTAINING TO THE HOME:  Any stairs in or around the home? No  If so, are there any without handrails? No  Home free of loose throw rugs in walkways, pet beds, electrical cords, etc? Yes  Adequate lighting in your home to reduce risk of falls? Yes   ASSISTIVE DEVICES UTILIZED TO PREVENT FALLS:  Life alert? No  Use of a cane, walker or w/c? Yes  Grab bars in the bathroom? Yes  Shower chair or bench in shower? Yes  Elevated toilet seat or a handicapped toilet? Yes   TIMED UP AND GO:  Was the test performed? No .  Length of time to ambulate 10 feet: N/A sec.   Gait slow and steady with assistive device  Cognitive Function:    12/11/2020    4:42 PM 05/06/2020    2:47 PM  MMSE - Mini Mental State Exam  Not completed: Unable to complete   Orientation to time  0  Orientation to Place  1  Registration  2  Attention/ Calculation  0  Recall  2  Language- name 2 objects  2  Language- repeat  0  Language- follow 3 step command  3  Language- read & follow direction  1  Write a sentence  1  Copy design  0  Copy design-comments  3 animals  Total score  12        Immunizations Immunization History  Administered Date(s) Administered   Influenza-Unspecified 04/02/2014, 04/01/2016, 04/14/2017, 03/17/2020, 04/07/2021   Moderna SARS-COV2 Booster Vaccination 11/18/2020   Moderna Sars-Covid-2 Vaccination 06/17/2019, 07/15/2019, 04/27/2020, 11/24/2021    PFIZER(Purple Top)SARS-COV-2 Vaccination 03/03/2021   Pneumococcal Conjugate-13 03/24/2014  Pneumococcal Polysaccharide-23 02/26/2009   Tdap 03/08/2012, 12/23/2018   Zoster, Live 09/03/2007    TDAP status: Up to date  Flu Vaccine status: Up to date  Pneumococcal vaccine status: Up to date  Covid-19 vaccine status: Completed vaccines  Qualifies for Shingles Vaccine? Yes   Zostavax completed Yes   Shingrix Completed?: No.    Education has been provided regarding the importance of this vaccine. Patient has been advised to call insurance company to determine out of pocket expense if they have not yet received this vaccine. Advised may also receive vaccine at local pharmacy or Health Dept. Verbalized acceptance and understanding.  Screening Tests Health Maintenance  Topic Date Due   DEXA SCAN  Never done   INFLUENZA VACCINE  01/11/2022   COVID-19 Vaccine (6 - Moderna series) 01/19/2022   TETANUS/TDAP  12/22/2028   Pneumonia Vaccine 33+ Years old  Completed   HPV VACCINES  Aged Out   Zoster Vaccines- Shingrix  Discontinued    Health Maintenance  Health Maintenance Due  Topic Date Due   DEXA SCAN  Never done    Colorectal cancer screening: No longer required.   Mammogram status: No longer required due to advanced age.  Bone Density status: Completed 1996. Results reflect: Bone density results: NORMAL. Repeat every 2 years.  Lung Cancer Screening: (Low Dose CT Chest recommended if Age 58-80 years, 30 pack-year currently smoking OR have quit w/in 15years.) does not qualify.   Lung Cancer Screening Referral: No  Additional Screening:  Hepatitis C Screening: does not qualify;advanced age  Vision Screening: Recommended annual ophthalmology exams for early detection of glaucoma and other disorders of the eye. Is the patient up to date with their annual eye exam?  Yes  Who is the provider or what is the name of the office in which the patient attends annual eye exams? Dr.  Manuella Ghazi If pt is not established with a provider, would they like to be referred to a provider to establish care? No .   Dental Screening: Recommended annual dental exams for proper oral hygiene  Community Resource Referral / Chronic Care Management: CRR required this visit?  No   CCM required this visit?  No      Plan:     I have personally reviewed and noted the following in the patient's chart:   Medical and social history Use of alcohol, tobacco or illicit drugs  Current medications and supplements including opioid prescriptions.  Functional ability and status Nutritional status Physical activity Advanced directives List of other physicians Hospitalizations, surgeries, and ER visits in previous 12 months Vitals Screenings to include cognitive, depression, and falls Referrals and appointments  In addition, I have reviewed and discussed with patient certain preventive protocols, quality metrics, and best practice recommendations. A written personalized care plan for preventive services as well as general preventive health recommendations were provided to patient.     Yvonna Alanis, NP   12/22/2021

## 2022-01-13 ENCOUNTER — Encounter: Payer: Self-pay | Admitting: Internal Medicine

## 2022-01-13 ENCOUNTER — Non-Acute Institutional Stay (SKILLED_NURSING_FACILITY): Payer: Medicare Other | Admitting: Internal Medicine

## 2022-01-13 DIAGNOSIS — N1832 Chronic kidney disease, stage 3b: Secondary | ICD-10-CM

## 2022-01-13 DIAGNOSIS — I480 Paroxysmal atrial fibrillation: Secondary | ICD-10-CM | POA: Diagnosis not present

## 2022-01-13 DIAGNOSIS — E782 Mixed hyperlipidemia: Secondary | ICD-10-CM | POA: Diagnosis not present

## 2022-01-13 DIAGNOSIS — G309 Alzheimer's disease, unspecified: Secondary | ICD-10-CM

## 2022-01-13 DIAGNOSIS — R600 Localized edema: Secondary | ICD-10-CM

## 2022-01-13 DIAGNOSIS — D649 Anemia, unspecified: Secondary | ICD-10-CM

## 2022-01-13 DIAGNOSIS — F0280 Dementia in other diseases classified elsewhere without behavioral disturbance: Secondary | ICD-10-CM

## 2022-01-13 DIAGNOSIS — I63431 Cerebral infarction due to embolism of right posterior cerebral artery: Secondary | ICD-10-CM

## 2022-01-13 NOTE — Progress Notes (Signed)
Location:   [place] [facility] Room Number: 7-A Place of Service:  SNF [phone]) Provider:  Sherre Lain    Patient Care Team: Virgie Dad, MD as PCP - General (Internal Medicine)  Extended Emergency Contact Information Primary Emergency Contact: [name] Address: [address]          [place], [place] [address] Johnnette Litter of [place] Phone: [phone] Relation: Son  Code Status:  DNR Goals of care: Advanced Directive information    01/13/2022   11:52 AM  Advanced Directives  Does Patient Have a Medical Advance Directive? Yes  Type of Paramedic of [place];Living will;Out of facility DNR (pink MOST or yellow form)  Does patient want to make changes to medical advance directive? No - Patient declined  Copy of [place] in Chart? Yes - validated most recent copy scanned in chart (See row information)     Chief Complaint  Patient presents with   Medical Management of Chronic Issues    Routine Visit.    Immunizations    Discuss the need for Influenza vaccine.    HPI:  Pt is a 88 y.o. female seen today for medical management of chronic diseases.    Long term resident of SNF H/o right PCA infarct.  Has been on Eliquis since then.   Also has a history of Alzheimer's dementia,  hypertension,  A. fib.  Her echo had a EF of 60 to 65% with LA dilated CT scan showed Temporal Atrophy and Chronic Cerebral Infarcts     She is stable. No new Nursing issues. No Behavior issues Can walk with his walker and assist Can do her transfers Cognition is getting worse Needs help with her feeding No Falls Has lost 10 lbs in past few months Wt Readings from Last 3 Encounters:  01/13/22 175 lb 4.8 oz (79.5 kg)  12/22/21 171 lb (77.6 kg)  12/17/21 170 lb (77.1 kg)     Past Medical History:  Diagnosis Date   Alzheimer disease ([place])    Atrial fibrillation ([place])    Hypertension    History reviewed. No  pertinent surgical history.  Allergies  Allergen Reactions   Bee Venom Other (See Comments)    Listed on MAR    Allergies as of 01/13/2022       Reactions   Bee Venom Other (See Comments)   Listed on [facility]        Medication List        Accurate as of January 13, 2022 11:52 AM. If you have any questions, ask your nurse or doctor.          STOP taking these medications    lactose free nutrition Liqd Stopped by: Virgie Dad, MD       TAKE these medications    apixaban 5 MG Tabs tablet Commonly known as: ELIQUIS Take 5 mg by mouth 2 (two) times daily.   bimatoprost 0.01 % Soln Commonly known as: LUMIGAN Place 1 drop into both eyes at bedtime.   CALCIUM 600 + D PO Take 1 tablet by mouth in the morning and at bedtime.   CENTRUM SILVER PO Take 1 tablet by mouth daily.   donepezil 5 MG tablet Commonly known as: ARICEPT Take 5 mg by mouth at bedtime.   furosemide 20 MG tablet Commonly known as: LASIX Take 20 mg by mouth daily.   Metoprolol Succinate 50 MG Cs24 Take 50 mg by mouth daily. Hold if  Systolic is < 95 or if pulse is less than 60   zinc oxide 20 % ointment Apply 1 application topically as needed for irritation. Apply to buttocks after every incontinent episode and as needed for redness        Review of Systems  Unable to perform ROS: Dementia    Immunization History  Administered Date(s) Administered   Influenza-Unspecified 04/02/2014, 04/01/2016, 04/14/2017, 03/17/2020, 04/07/2021   Moderna SARS-COV2 Booster Vaccination 11/18/2020   Moderna Sars-Covid-2 Vaccination 06/17/2019, 07/15/2019, 04/27/2020, 11/24/2021   PFIZER(Purple Top)SARS-COV-2 Vaccination 03/03/2021   Pneumococcal Conjugate-13 03/24/2014   Pneumococcal Polysaccharide-23 02/26/2009   Tdap 03/08/2012, 12/23/2018   Zoster, Live 09/03/2007   Pertinent  Health Maintenance Due  Topic Date Due   INFLUENZA VACCINE  01/11/2022   DEXA SCAN  Discontinued      11/14/2019    10:00 PM 11/15/2019    8:00 AM 11/16/2019    8:00 AM 12/11/2020    4:41 PM 12/22/2021    2:26 PM  Fall Risk  Falls in the past year?    1 1  Was there an injury with Fall?    0 0  Fall Risk Category Calculator    2 1  Fall Risk Category    Moderate Low  Patient Fall Risk Level High fall risk High fall risk High fall risk High fall risk High fall risk  Patient at Risk for Falls Due to    History of fall(s);Impaired balance/gait;Impaired mobility;Mental status change History of fall(s);Impaired balance/gait  Patient at Risk for Falls Due to - Comments     Alzheimers dementia  Fall risk Follow up    Falls evaluation completed;Education provided;Falls prevention discussed Falls evaluation completed;Education provided;Falls prevention discussed   Functional Status Survey:    Vitals:   01/13/22 1149  BP: (!) 142/86  Pulse: 77  Resp: 16  Temp: (!) 97.1 F (36.2 C)  SpO2: 90%  Weight: 175 lb 4.8 oz (79.5 kg)  Height: 5\' 1"  (1.549 m)   Body mass index is 33.12 kg/m. Physical Exam Vitals reviewed.  Constitutional:      Appearance: Normal appearance.  HENT:     Head: Normocephalic.     Nose: Nose normal.     Mouth/Throat:     Mouth: Mucous membranes are moist.     Pharynx: Oropharynx is clear.  Eyes:     Pupils: Pupils are equal, round, and reactive to light.  Cardiovascular:     Rate and Rhythm: Normal rate and regular rhythm.     Pulses: Normal pulses.     Heart sounds: Normal heart sounds. No murmur heard. Pulmonary:     Effort: Pulmonary effort is normal.     Breath sounds: Normal breath sounds.  Abdominal:     General: Abdomen is flat. Bowel sounds are normal.     Palpations: Abdomen is soft.  Musculoskeletal:        General: No swelling.     Cervical back: Neck supple.  Skin:    General: Skin is warm.  Neurological:     General: No focal deficit present.     Mental Status: She is alert.  Psychiatric:        Mood and Affect: Mood normal.        Thought Content:  Thought content normal.     Labs reviewed: Recent Labs    03/18/21 0000 09/17/21 0000  NA 138 137  K 4.7 4.3  CL 102 101  CO2 26* 29*  BUN 29* 27*  CREATININE 1.0  --   CALCIUM 9.0  --    Recent Labs    03/18/21 0000 09/17/21 0000  AST 16 14  ALT 13 11  ALKPHOS 91  --   ALBUMIN 3.5 3.7   Recent Labs    03/18/21 0000 09/17/21 0000  WBC 8.2 6.8  NEUTROABS 5,609.00 4,481.00  HGB 11.1* 10.9*  HCT 34* 34*  PLT 250 256   Lab Results  Component Value Date   TSH 3.05 09/17/2021   Lab Results  Component Value Date   HGBA1C 5.3 11/13/2019   Lab Results  Component Value Date   CHOL 89 02/27/2020   HDL 46 02/27/2020   LDLCALC 30 02/27/2020   TRIG 51 02/27/2020   CHOLHDL 2.2 11/13/2019    Significant Diagnostic Results in last 30 days:  No results found.  Assessment/Plan 1. Alzheimer disease (HCC) Worsening Cognition Needs more help with her ADLS On Aricept  2. Paroxysmal atrial fibrillation (HCC) On Eliquis and Metoprolol  3. Stage 3a chronic kidney disease (HCC) Creat stable  4. Mixed hyperlipidemia Off statin per family request  5. Bilateral lower extremity edema Low dose of lasix  6. 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 Anemia, unspecified type HGB stable Not candidate for more work up    [employer] staff Communication:   Labs/tests ordered:

## 2022-02-16 ENCOUNTER — Non-Acute Institutional Stay (SKILLED_NURSING_FACILITY): Payer: Medicare Other | Admitting: Orthopedic Surgery

## 2022-02-16 ENCOUNTER — Encounter: Payer: Self-pay | Admitting: Orthopedic Surgery

## 2022-02-16 DIAGNOSIS — R635 Abnormal weight gain: Secondary | ICD-10-CM

## 2022-02-16 DIAGNOSIS — D631 Anemia in chronic kidney disease: Secondary | ICD-10-CM

## 2022-02-16 DIAGNOSIS — E782 Mixed hyperlipidemia: Secondary | ICD-10-CM

## 2022-02-16 DIAGNOSIS — N1832 Chronic kidney disease, stage 3b: Secondary | ICD-10-CM | POA: Diagnosis not present

## 2022-02-16 DIAGNOSIS — I63431 Cerebral infarction due to embolism of right posterior cerebral artery: Secondary | ICD-10-CM

## 2022-02-16 DIAGNOSIS — G309 Alzheimer's disease, unspecified: Secondary | ICD-10-CM

## 2022-02-16 DIAGNOSIS — R600 Localized edema: Secondary | ICD-10-CM

## 2022-02-16 DIAGNOSIS — I480 Paroxysmal atrial fibrillation: Secondary | ICD-10-CM | POA: Diagnosis not present

## 2022-02-16 DIAGNOSIS — F0280 Dementia in other diseases classified elsewhere without behavioral disturbance: Secondary | ICD-10-CM

## 2022-02-16 DIAGNOSIS — N183 Chronic kidney disease, stage 3 unspecified: Secondary | ICD-10-CM

## 2022-02-16 NOTE — Progress Notes (Signed)
Location:  [facility] Room Number: 7/A Place of Service:  SNF [phone]) Provider:  Octavia Heir, NP   Mahlon Gammon, MD  Patient Care Team: Mahlon Gammon, MD as PCP - General (Internal Medicine)  Extended Emergency Contact Information Primary Emergency Contact: Marval Regal Address: [address]          [place], [place] [address] Darden Amber of [place] Home Phone: [phone] Relation: Son  Code Status:  DNR Goals of care: Advanced Directive information    01/13/2022   11:52 AM  Advanced Directives  Does Patient Have a Medical Advance Directive? Yes  Type of Estate agent of [place];Living will;Out of facility DNR (pink MOST or yellow form)  Does patient want to make changes to medical advance directive? No - Patient declined  Copy of Healthcare Power of Attorney in Chart? Yes - validated most recent copy scanned in chart (See row information)     Chief Complaint  Patient presents with   Medical Management of Chronic Issues         HPI:  Pt is a 88 y.o. female seen today for medical management of chronic diseases.    She continues to reside on the skilled nursing unit at [facility]. Past medical history includes: CVA, hypertension, atrial fibrillation, Alzheimer's, CKD stage 3, hyperlipidemia and anemia related to CKD.    Weight gain- see weights below, TSH 3.05 09/17/2021, remains on Boost Alzheimer's- CT head 09/2020 revealed bilateral temporal lobe atrophy, MMSE 12/30 2021, recent BIMS score 3 12/13/2021, no behavioral outbursts, aphasia, needs assistance with transfers and ADLs, remains on Aricept Afib- HR controlled with metoprolol, remains on Eliquis for clot prevention, TSH 3.05 09/17/2021 CKD- BUN/creat 27/1.04 09/17/2021 HLD- LDL 30 02/2020, off statin per HPOA BLE- non pitting today, sits in wheelchair often, remains on lasix daily Anemia- hgb 10.9 09/17/2021, ferritin 49, iron 51, B12 475 05/2020 Hx  CVA- MRI brain 11/2019 revealed acute right PCA territory infarct with petechial hemorrhage, left sided weakness, off statin at this time per HPOA  No recent falls or injuries. Ambulates with wheelchair.   Recent blood pressures:  09/05- 121/67, 138/61  09/04- 152/77  09/03- 117/74  Recent weights:  09/01- not recorded  08/01- 175.3 lbs  07/07- 171 lbs  07/04- 170 lbs   Past Medical History:  Diagnosis Date   Alzheimer disease (HCC)    Atrial fibrillation (HCC)    Hypertension    No past surgical history on file.  Allergies  Allergen Reactions   Bee Venom Other (See Comments)    Listed on [facility]    Outpatient Encounter Medications as of 02/16/2022  Medication Sig   apixaban (ELIQUIS) 5 MG TABS tablet Take 5 mg by mouth 2 (two) times daily.   bimatoprost (LUMIGAN) 0.01 % SOLN Place 1 drop into both eyes at bedtime.    Calcium Carb-Cholecalciferol (CALCIUM 600 + D PO) Take 1 tablet by mouth in the morning and at bedtime.   donepezil (ARICEPT) 5 MG tablet Take 5 mg by mouth at bedtime.   furosemide (LASIX) 20 MG tablet Take 20 mg by mouth daily.   Metoprolol Succinate 50 MG CS24 Take 50 mg by mouth daily. Hold if Systolic is < 95 or if pulse is less than 60   Multiple Vitamins-Minerals (CENTRUM SILVER PO) Take 1 tablet by mouth daily.   zinc oxide 20 % ointment Apply 1 application topically as needed for irritation. Apply to buttocks after every incontinent  episode and as needed for redness   No facility-administered encounter medications on file as of 02/16/2022.    Review of Systems  Unable to perform ROS: Dementia    Immunization History  Administered Date(s) Administered   Influenza-Unspecified 04/02/2014, 04/01/2016, 04/14/2017, 03/17/2020, 04/07/2021   Moderna SARS-COV2 Booster Vaccination 11/18/2020   Moderna Sars-Covid-2 Vaccination 06/17/2019, 07/15/2019, 04/27/2020, 11/24/2021   PFIZER(Purple Top)SARS-COV-2 Vaccination 03/03/2021   Pneumococcal Conjugate-13  03/24/2014   Pneumococcal Polysaccharide-23 02/26/2009   Tdap 03/08/2012, 12/23/2018   Zoster, Live 09/03/2007   Pertinent  Health Maintenance Due  Topic Date Due   INFLUENZA VACCINE  01/11/2022   DEXA SCAN  Discontinued      11/14/2019   10:00 PM 11/15/2019    8:00 AM 11/16/2019    8:00 AM 12/11/2020    4:41 PM 12/22/2021    2:26 PM  Fall Risk  Falls in the past year?    1 1  Was there an injury with Fall?    0 0  Fall Risk Category Calculator    2 1  Fall Risk Category    Moderate Low  Patient Fall Risk Level High fall risk High fall risk High fall risk High fall risk High fall risk  Patient at Risk for Falls Due to    History of fall(s);Impaired balance/gait;Impaired mobility;Mental status change History of fall(s);Impaired balance/gait  Patient at Risk for Falls Due to - Comments     Alzheimers dementia  Fall risk Follow up    Falls evaluation completed;Education provided;Falls prevention discussed Falls evaluation completed;Education provided;Falls prevention discussed   Functional Status Survey:    Vitals:   02/16/22 1113  BP: 121/67  Pulse: 76  Resp: 20  Temp: (!) 96.9 F (36.1 C)  SpO2: 96%  Weight: 175 lb 4.8 oz (79.5 kg)  Height: 5\' 1"  (1.549 m)   Body mass index is 33.12 kg/m. Physical Exam Vitals reviewed.  Constitutional:      General: She is not in acute distress. HENT:     Head: Normocephalic.     Right Ear: There is no impacted cerumen.     Left Ear: There is no impacted cerumen.     Nose: Nose normal.  Eyes:     General:        Right eye: No discharge.        Left eye: No discharge.  Cardiovascular:     Rate and Rhythm: Normal rate. Rhythm irregular.     Pulses: Normal pulses.     Heart sounds: Normal heart sounds.  Pulmonary:     Effort: Pulmonary effort is normal. No respiratory distress.     Breath sounds: Normal breath sounds. No wheezing.  Abdominal:     General: Bowel sounds are normal. There is no distension.     Palpations: Abdomen is  soft.     Tenderness: There is no abdominal tenderness.  Musculoskeletal:     Cervical back: Neck supple.     Right lower leg: Edema present.     Left lower leg: Edema present.     Comments: Non pitting L>R  Skin:    General: Skin is warm and dry.     Capillary Refill: Capillary refill takes less than 2 seconds.  Neurological:     General: No focal deficit present.     Mental Status: She is alert. Mental status is at baseline.     Motor: Weakness present.     Gait: Gait abnormal.     Comments: wheelchair  Psychiatric:        Mood and Affect: Mood normal.        Behavior: Behavior normal.     Comments: Follows commands, alert to self     Labs reviewed: Recent Labs    03/18/21 0000 09/17/21 0000  NA 138 137  K 4.7 4.3  CL 102 101  CO2 26* 29*  BUN 29* 27*  CREATININE 1.0  --   CALCIUM 9.0  --    Recent Labs    03/18/21 0000 09/17/21 0000  AST 16 14  ALT 13 11  ALKPHOS 91  --   ALBUMIN 3.5 3.7   Recent Labs    03/18/21 0000 09/17/21 0000  WBC 8.2 6.8  NEUTROABS 5,609.00 4,481.00  HGB 11.1* 10.9*  HCT 34* 34*  PLT 250 256   Lab Results  Component Value Date   TSH 3.05 09/17/2021   Lab Results  Component Value Date   HGBA1C 5.3 11/13/2019   Lab Results  Component Value Date   CHOL 89 02/27/2020   HDL 46 02/27/2020   LDLCALC 30 02/27/2020   TRIG 51 02/27/2020   CHOLHDL 2.2 11/13/2019    Significant Diagnostic Results in last 30 days:  No results found.  Assessment/Plan 1. Weight gain - 02/2022 not recorded - TSH stable - cont monthly weights  2. Alzheimer disease ([place]) - no behaviors - needs assistance with ADLs except feedings - cont Aricept  3. Paroxysmal atrial fibrillation (HCC) - HR controlled with metoprolol - cont Eliquis for clot prevention  4. Stage 3b chronic kidney disease ([place]) -avoid nephrotoxic drugs like NSAIDS and dose adjust medications to be renally excreted - encourage hydration with water   5. Mixed  hyperlipidemia - off statin per HPOA  6. Bilateral lower extremity edema - non pitting today - cont furosemide  7. Anemia due to stage 3 chronic kidney disease, unspecified whether stage 3a or 3b CKD (HCC) - hgb stable  8. Cerebrovascular accident (CVA) due to embolism of right posterior cerebral artery ([place]) - H/o right PCA infarct 11/2019 - cont Eliquis     Family/ staff Communication: plan discussed with patient and nurse  Labs/tests ordered:  none

## 2022-03-14 ENCOUNTER — Non-Acute Institutional Stay (SKILLED_NURSING_FACILITY): Payer: Medicare Other | Admitting: Orthopedic Surgery

## 2022-03-14 ENCOUNTER — Encounter: Payer: Self-pay | Admitting: Orthopedic Surgery

## 2022-03-14 DIAGNOSIS — N1832 Chronic kidney disease, stage 3b: Secondary | ICD-10-CM | POA: Diagnosis not present

## 2022-03-14 DIAGNOSIS — R600 Localized edema: Secondary | ICD-10-CM

## 2022-03-14 DIAGNOSIS — F0280 Dementia in other diseases classified elsewhere without behavioral disturbance: Secondary | ICD-10-CM

## 2022-03-14 DIAGNOSIS — I63431 Cerebral infarction due to embolism of right posterior cerebral artery: Secondary | ICD-10-CM

## 2022-03-14 DIAGNOSIS — D631 Anemia in chronic kidney disease: Secondary | ICD-10-CM

## 2022-03-14 DIAGNOSIS — I480 Paroxysmal atrial fibrillation: Secondary | ICD-10-CM

## 2022-03-14 DIAGNOSIS — E782 Mixed hyperlipidemia: Secondary | ICD-10-CM

## 2022-03-14 DIAGNOSIS — G309 Alzheimer's disease, unspecified: Secondary | ICD-10-CM | POA: Diagnosis not present

## 2022-03-14 DIAGNOSIS — N183 Chronic kidney disease, stage 3 unspecified: Secondary | ICD-10-CM

## 2022-03-14 DIAGNOSIS — R635 Abnormal weight gain: Secondary | ICD-10-CM

## 2022-03-14 NOTE — Progress Notes (Signed)
Location:  [facility] Room Number: 7/A Place of Service:  SNF [phone]) Provider:  Yvonna Alanis, NP   Virgie Dad, MD  Patient Care Team: Virgie Dad, MD as PCP - General (Internal Medicine)  Extended Emergency Contact Information Primary Emergency Contact: [name] Address: [address]          [place], [place] [address] Johnnette Litter of [place] Phone: [phone] Relation: Son  Code Status:  DNR Goals of care: Advanced Directive information    01/13/2022   11:52 AM  Advanced Directives  Does Patient Have a Medical Advance Directive? Yes  Type of Paramedic of [place];Living will;Out of facility DNR (pink MOST or yellow form)  Does patient want to make changes to medical advance directive? No - Patient declined  Copy of [place] in Chart? Yes - validated most recent copy scanned in chart (See row information)     Chief Complaint  Patient presents with   Medical Management of Chronic Issues    HPI:  Pt is a 88 y.o. female seen today for medical management of chronic diseases.    She continues to reside on the skilled nursing unit at [facility]. Past medical history includes: CVA, hypertension, atrial fibrillation, Alzheimer's, CKD stage 3, hyperlipidemia and anemia related to CKD.    Alzheimer's- CT head 09/2020 revealed bilateral temporal lobe atrophy, MMSE 12/30 2021, recent BIMS score 3 12/13/2021, no behavioral outbursts, aphasia, needs assistance with transfers and ADLs, remains on Aricept Afib- HR controlled with metoprolol, remains on Eliquis for clot prevention, TSH 3.05 09/17/2021 CKD- BUN/creat 27/1.04 09/17/2021 HLD- LDL 30 02/2020, off statin per HPOA BLE- non pitting today, sits in wheelchair often, remains on lasix daily Weight gain- see weights below, TSH 3.05 09/17/2021, remains on Boost Anemia- hgb 10.9 09/17/2021 Hx CVA- MRI brain 11/2019 revealed acute right PCA  territory infarct with petechial hemorrhage, left sided weakness, off statin at this time per HPOA  No recent falls or injuries.   Flu vaccine to be given 03/2022 per [facility].   Recent weights:  10/02- not recorded  09/06- 169.3 lbs  08/01- 175.3 lbs  07/07- 171 lbs  Recent blood pressures:  10/01- 127/68  09/30- 140/66  09/29- 138/85 Past Medical History:  Diagnosis Date   Alzheimer disease ([place])    Atrial fibrillation ([place])    Hypertension    No past surgical history on file.  Allergies  Allergen Reactions   Bee Venom Other (See Comments)    Listed on [facility]    Outpatient Encounter Medications as of 03/14/2022  Medication Sig   apixaban (ELIQUIS) 5 MG TABS tablet Take 5 mg by mouth 2 (two) times daily.   bimatoprost (LUMIGAN) 0.01 % SOLN Place 1 drop into both eyes at bedtime.    Calcium Carb-Cholecalciferol (CALCIUM 600 + D PO) Take 1 tablet by mouth in the morning and at bedtime.   donepezil (ARICEPT) 5 MG tablet Take 5 mg by mouth at bedtime.   furosemide (LASIX) 20 MG tablet Take 20 mg by mouth daily.   Metoprolol Succinate 50 MG CS24 Take 50 mg by mouth daily. Hold if Systolic is < 95 or if pulse is less than 60   Multiple Vitamins-Minerals (CENTRUM SILVER PO) Take 1 tablet by mouth daily.   zinc oxide 20 % ointment Apply 1 application topically as needed for irritation. Apply to buttocks after every incontinent episode and as needed for redness  No facility-administered encounter medications on file as of 03/14/2022.    Review of Systems  Unable to perform ROS: Dementia    Immunization History  Administered Date(s) Administered   Influenza-Unspecified 04/02/2014, 04/01/2016, 04/14/2017, 03/17/2020, 04/07/2021   Moderna SARS-COV2 Booster Vaccination 11/18/2020   Moderna Sars-Covid-2 Vaccination 06/17/2019, 07/15/2019, 04/27/2020, 11/24/2021   PFIZER(Purple Top)SARS-COV-2 Vaccination 03/03/2021   Pneumococcal Conjugate-13 03/24/2014   Pneumococcal  Polysaccharide-23 02/26/2009   Tdap 03/08/2012, 12/23/2018   Zoster, Live 09/03/2007   Pertinent  Health Maintenance Due  Topic Date Due   INFLUENZA VACCINE  01/11/2022   DEXA SCAN  Discontinued      11/14/2019   10:00 PM 11/15/2019    8:00 AM 11/16/2019    8:00 AM 12/11/2020    4:41 PM 12/22/2021    2:26 PM  Fall Risk  Falls in the past year?    1 1  Was there an injury with Fall?    0 0  Fall Risk Category Calculator    2 1  Fall Risk Category    Moderate Low  Patient Fall Risk Level High fall risk High fall risk High fall risk High fall risk High fall risk  Patient at Risk for Falls Due to    History of fall(s);Impaired balance/gait;Impaired mobility;Mental status change History of fall(s);Impaired balance/gait  Patient at Risk for Falls Due to - Comments     Alzheimers dementia  Fall risk Follow up    Falls evaluation completed;Education provided;Falls prevention discussed Falls evaluation completed;Education provided;Falls prevention discussed   Functional Status Survey:    Vitals:   03/14/22 1355  BP: 127/67  Pulse: 67  Resp: 20  Temp: (!) 96.1 F (35.6 C)  SpO2: 97%  Weight: 169 lb 4.8 oz (76.8 kg)  Height: 5\' 1"  (1.549 m)   Body mass index is 31.99 kg/m. Physical Exam Vitals reviewed.  Constitutional:      General: She is not in acute distress. HENT:     Head: Normocephalic.     Right Ear: There is no impacted cerumen.     Left Ear: There is no impacted cerumen.     Nose: Nose normal.     Mouth/Throat:     Mouth: Mucous membranes are moist.     Comments: Dentures Eyes:     General:        Right eye: No discharge.        Left eye: No discharge.  Cardiovascular:     Rate and Rhythm: Normal rate. Rhythm irregular.     Pulses: Normal pulses.     Heart sounds: Normal heart sounds.  Pulmonary:     Effort: Pulmonary effort is normal. No respiratory distress.     Breath sounds: Normal breath sounds. No wheezing.  Abdominal:     General: Bowel sounds are  normal. There is no distension.     Palpations: Abdomen is soft.     Tenderness: There is no abdominal tenderness.  Musculoskeletal:     Cervical back: Neck supple.     Right lower leg: No edema.     Left lower leg: No edema.  Skin:    General: Skin is warm and dry.     Capillary Refill: Capillary refill takes less than 2 seconds.  Neurological:     General: No focal deficit present.     Mental Status: She is alert. Mental status is at baseline.     Motor: Weakness present.     Gait: Gait abnormal.  Comments: wheelchair  Psychiatric:        Mood and Affect: Mood normal.        Behavior: Behavior normal.     Comments: Very pleasant, follows some commands, alert to self     Labs reviewed: Recent Labs    03/18/21 0000 09/17/21 0000  NA 138 137  K 4.7 4.3  CL 102 101  CO2 26* 29*  BUN 29* 27*  CREATININE 1.0  --   CALCIUM 9.0  --    Recent Labs    03/18/21 0000 09/17/21 0000  AST 16 14  ALT 13 11  ALKPHOS 91  --   ALBUMIN 3.5 3.7   Recent Labs    03/18/21 0000 09/17/21 0000  WBC 8.2 6.8  NEUTROABS 5,609.00 4,481.00  HGB 11.1* 10.9*  HCT 34* 34*  PLT 250 256   Lab Results  Component Value Date   TSH 3.05 09/17/2021   Lab Results  Component Value Date   HGBA1C 5.3 11/13/2019   Lab Results  Component Value Date   CHOL 89 02/27/2020   HDL 46 02/27/2020   LDLCALC 30 02/27/2020   TRIG 51 02/27/2020   CHOLHDL 2.2 11/13/2019    Significant Diagnostic Results in last 30 days:  No results found.  Assessment/Plan 1. Alzheimer disease ([place]) - no behaviors - dependent with ADLs except feeding - cont Aricept - cmp  2. Paroxysmal atrial fibrillation (HCC) - HR controlled with metoprolol - cont Eliquis for clot prevention  3. Stage 3b chronic kidney disease ([place]) -avoid nephrotoxic drugs like NSAIDS and dose adjust medications to be renally excreted - encourage hydration with water   4. Mixed hyperlipidemia - LDL stable - off statin per  family  5. Bilateral lower extremity edema - non pitting - cont furosemide  6. Weight gain - stable - cont monthly weights  7. Anemia due to stage 3 chronic kidney disease, unspecified whether stage 3a or 3b CKD (HCC) - hgb stable - cbc/diff  8. Cerebrovascular accident (CVA) due to embolism of right posterior cerebral artery ([place]) - off statin per family - cont Eliquis   Family/ staff Communication: plan discussed with patient and nurse  Labs/tests ordered:  cbc/diff, cmp 03/17/2022

## 2022-04-08 ENCOUNTER — Non-Acute Institutional Stay (SKILLED_NURSING_FACILITY): Payer: Medicare Other | Admitting: Orthopedic Surgery

## 2022-04-08 ENCOUNTER — Encounter: Payer: Self-pay | Admitting: Orthopedic Surgery

## 2022-04-08 DIAGNOSIS — F03911 Unspecified dementia, unspecified severity, with agitation: Secondary | ICD-10-CM

## 2022-04-08 DIAGNOSIS — F02818 Dementia in other diseases classified elsewhere, unspecified severity, with other behavioral disturbance: Secondary | ICD-10-CM | POA: Diagnosis not present

## 2022-04-08 DIAGNOSIS — G301 Alzheimer's disease with late onset: Secondary | ICD-10-CM

## 2022-04-08 NOTE — Progress Notes (Signed)
Location:  [facility] Room Number: 7/A Place of Service:  SNF [phone]) Provider:  Octavia Heir, NP   Mahlon Gammon, MD  Patient Care Team: Mahlon Gammon, MD as PCP - General (Internal Medicine)  Extended Emergency Contact Information Primary Emergency Contact: Marval Regal Address: [address]          [place], [place] [address] Darden Amber of [place] Home Phone: [phone] Relation: Son  Code Status:  DNR Goals of care: Advanced Directive information    01/13/2022   11:52 AM  Advanced Directives  Does Patient Have a Medical Advance Directive? Yes  Type of Estate agent of [place];Living will;Out of facility DNR (pink MOST or yellow form)  Does patient want to make changes to medical advance directive? No - Patient declined  Copy of Healthcare Power of Attorney in Chart? Yes - validated most recent copy scanned in chart (See row information)     Chief Complaint  Patient presents with   Acute Visit    Increased confusion    HPI:  Pt is a 88 y.o. female seen today for acute visit due to increased confusion/agitation.   She continues to reside on the skilled nursing unit at [facility]. Past medical history includes: CVA, hypertension, atrial fibrillation, Alzheimer's, CKD stage 3, hyperlipidemia and anemia related to CKD.   Nursing reports increased agitation 10/26 and this morning. Nursing staff tried to get her out of bed and she began to swatting at staff. She also would not eat breakfast. Poor historian due to dementia and aphasia. She is pleasant during our encounter and let me examine her without difficulty. She is currently taking Aricept.   Alzheimer's- CT head 09/2020 revealed bilateral temporal lobe atrophy, MMSE 12/30 2021, recent BIMS score 1/15 03/25/2022, was 3/10 12/13/2021, no behavioral outbursts, aphasia, needs assistance with transfers and ADLs, remains on Aricept  No recent falls or injuries.  Ambulates with wheelchair.      Past Medical History:  Diagnosis Date   Alzheimer disease [facility])    Atrial fibrillation (HCC)    Hypertension    No past surgical history on file.  Allergies  Allergen Reactions   Bee Venom Other (See Comments)    Listed on [facility]    Outpatient Encounter Medications as of 04/08/2022  Medication Sig   apixaban (ELIQUIS) 5 MG TABS tablet Take 5 mg by mouth 2 (two) times daily.   bimatoprost (LUMIGAN) 0.01 % SOLN Place 1 drop into both eyes at bedtime.    Calcium Carb-Cholecalciferol (CALCIUM 600 + D PO) Take 1 tablet by mouth in the morning and at bedtime.   donepezil (ARICEPT) 5 MG tablet Take 5 mg by mouth at bedtime.   furosemide (LASIX) 20 MG tablet Take 20 mg by mouth daily.   Metoprolol Succinate 50 MG CS24 Take 50 mg by mouth daily. Hold if Systolic is < 95 or if pulse is less than 60   Multiple Vitamins-Minerals (CENTRUM SILVER PO) Take 1 tablet by mouth daily.   zinc oxide 20 % ointment Apply 1 application topically as needed for irritation. Apply to buttocks after every incontinent episode and as needed for redness   No facility-administered encounter medications on file as of 04/08/2022.    Review of Systems  Unable to perform ROS: Dementia    Immunization History  Administered Date(s) Administered   Influenza-Unspecified 04/02/2014, 04/01/2016, 04/14/2017, 03/17/2020, 04/07/2021   Moderna SARS-COV2 Booster Vaccination 11/18/2020   Moderna Sars-Covid-2 Vaccination 06/17/2019,  07/15/2019, 04/27/2020, 11/24/2021   PFIZER(Purple Top)SARS-COV-2 Vaccination 03/03/2021   Pneumococcal Conjugate-13 03/24/2014   Pneumococcal Polysaccharide-23 02/26/2009   Tdap 03/08/2012, 12/23/2018   Zoster, Live 09/03/2007   Pertinent  Health Maintenance Due  Topic Date Due   INFLUENZA VACCINE  01/11/2022   DEXA SCAN  Discontinued      11/14/2019   10:00 PM 11/15/2019    8:00 AM 11/16/2019    8:00 AM 12/11/2020    4:41 PM 12/22/2021    2:26 PM  Fall  Risk  Falls in the past year?    1 1  Was there an injury with Fall?    0 0  Fall Risk Category Calculator    2 1  Fall Risk Category    Moderate Low  Patient Fall Risk Level High fall risk High fall risk High fall risk High fall risk High fall risk  Patient at Risk for Falls Due to    History of fall(s);Impaired balance/gait;Impaired mobility;Mental status change History of fall(s);Impaired balance/gait  Patient at Risk for Falls Due to - Comments     Alzheimers dementia  Fall risk Follow up    Falls evaluation completed;Education provided;Falls prevention discussed Falls evaluation completed;Education provided;Falls prevention discussed   Functional Status Survey:    Vitals:   04/08/22 1205  BP: (!) 143/86  Pulse: 89  Resp: 20  Temp: 97.6 F (36.4 C)  SpO2: 98%  Weight: 165 lb (74.8 kg)  Height: 5\' 1"  (1.549 m)   Body mass index is 31.18 kg/m. Physical Exam Vitals reviewed.  Constitutional:      General: She is not in acute distress. HENT:     Head: Normocephalic.  Eyes:     General:        Right eye: No discharge.        Left eye: No discharge.     Pupils: Pupils are equal, round, and reactive to light.  Cardiovascular:     Rate and Rhythm: Normal rate. Rhythm irregular.     Pulses: Normal pulses.     Heart sounds: Normal heart sounds.  Pulmonary:     Effort: Pulmonary effort is normal. No respiratory distress.     Breath sounds: Normal breath sounds. No wheezing or rales.  Abdominal:     General: Bowel sounds are normal. There is no distension.     Palpations: Abdomen is soft.     Tenderness: There is no abdominal tenderness.  Musculoskeletal:     Cervical back: Neck supple.     Right lower leg: Edema present.     Left lower leg: Edema present.     Comments: Non pitting  Skin:    General: Skin is warm and dry.     Capillary Refill: Capillary refill takes less than 2 seconds.  Neurological:     General: No focal deficit present.     Mental Status: She is  alert. Mental status is at baseline.     Motor: Weakness present.     Gait: Gait abnormal.     Comments: wheelchair  Psychiatric:        Mood and Affect: Mood normal.        Behavior: Behavior normal.     Labs reviewed: Recent Labs    09/17/21 0000  NA 137  K 4.3  CL 101  CO2 29*  BUN 27*   Recent Labs    09/17/21 0000  AST 14  ALT 11  ALBUMIN 3.7   Recent Labs  09/17/21 0000  WBC 6.8  NEUTROABS 4,481.00  HGB 10.9*  HCT 34*  PLT 256   Lab Results  Component Value Date   TSH 3.05 09/17/2021   Lab Results  Component Value Date   HGBA1C 5.3 11/13/2019   Lab Results  Component Value Date   CHOL 89 02/27/2020   HDL 46 02/27/2020   LDLCALC 30 02/27/2020   TRIG 51 02/27/2020   CHOLHDL 2.2 11/13/2019    Significant Diagnostic Results in last 30 days:  No results found.  Assessment/Plan 1. Agitation due to dementia ([place]) - behaviors with ADLs x 1 day - afebrile, vitals stable - exam unremarkable - acute onset with agitation- ? UTI - cbc/diff, cmp, UA/culture - cont Aricept - may consider Depakote if behaviors persist  2. Late onset Alzheimer disease with behavioral disturbance ([place]) - see above - BIMS score now 1/15, was 3/15 12/2021 - cont skilled nursing - cont Aricept    Family/ staff Communication: plan discussed with patient and nurse  Labs/tests ordered:  cbc/diff, cmp, UA/culture

## 2022-04-11 ENCOUNTER — Non-Acute Institutional Stay (SKILLED_NURSING_FACILITY): Payer: Medicare Other | Admitting: Orthopedic Surgery

## 2022-04-11 ENCOUNTER — Encounter: Payer: Self-pay | Admitting: Orthopedic Surgery

## 2022-04-11 DIAGNOSIS — F02818 Dementia in other diseases classified elsewhere, unspecified severity, with other behavioral disturbance: Secondary | ICD-10-CM

## 2022-04-11 DIAGNOSIS — N3000 Acute cystitis without hematuria: Secondary | ICD-10-CM

## 2022-04-11 DIAGNOSIS — F03911 Unspecified dementia, unspecified severity, with agitation: Secondary | ICD-10-CM | POA: Diagnosis not present

## 2022-04-11 DIAGNOSIS — G301 Alzheimer's disease with late onset: Secondary | ICD-10-CM

## 2022-04-11 DIAGNOSIS — R8290 Unspecified abnormal findings in urine: Secondary | ICD-10-CM | POA: Diagnosis not present

## 2022-04-11 NOTE — Progress Notes (Unsigned)
Location:  [facility] Room Number: 7/A Place of Service:  SNF [phone]) Provider:  Octavia Heir, NP   Mahlon Gammon, MD  Patient Care Team: Mahlon Gammon, MD as PCP - General (Internal Medicine)  Extended Emergency Contact Information Primary Emergency Contact: Marval Regal Address: [address]          [place], [place] [address] Darden Amber of [place] Home Phone: [phone] Relation: Son  Code Status:  DNR Goals of care: Advanced Directive information    01/13/2022   11:52 AM  Advanced Directives  Does Patient Have a Medical Advance Directive? Yes  Type of Estate agent of [place];Living will;Out of facility DNR (pink MOST or yellow form)  Does patient want to make changes to medical advance directive? No - Patient declined  Copy of Healthcare Power of Attorney in Chart? Yes - validated most recent copy scanned in chart (See row information)     Chief Complaint  Patient presents with   Acute Visit    Agitation     HPI:  Pt is a 88 y.o. female seen today for acute visit due to agitation/confusion.   She continues to reside on the skilled nursing unit at [facility]. Past medical history includes: CVA, hypertension, atrial fibrillation, Alzheimer's, CKD stage 3, hyperlipidemia and anemia related to CKD.   10/27 increased agitation/confusion noted by nursing staff. Behaviors increased with ADLs. She was also skipping meals and staying in bed more.10/27lLab work unremarkable. WBC 9.5, BUN/creat 23/1.45, Na 145, K+4.4, C02 27, AST/ALT 22/14. UA revealed trace protein and leukocytes. Urine culture revealed 10,000-49,000 cfu/mL Escherichia coli. Today, she is out of bed eating breakfast. Very pleasant. No other incidents of agitation per nursing.    Past Medical History:  Diagnosis Date   Alzheimer disease [facility])    Atrial fibrillation (HCC)    Hypertension    No past surgical history on file.  Allergies  Allergen  Reactions   Bee Venom Other (See Comments)    Listed on [facility]    Outpatient Encounter Medications as of 04/11/2022  Medication Sig   apixaban (ELIQUIS) 5 MG TABS tablet Take 5 mg by mouth 2 (two) times daily.   bimatoprost (LUMIGAN) 0.01 % SOLN Place 1 drop into both eyes at bedtime.    Calcium Carb-Cholecalciferol (CALCIUM 600 + D PO) Take 1 tablet by mouth in the morning and at bedtime.   donepezil (ARICEPT) 5 MG tablet Take 5 mg by mouth at bedtime.   furosemide (LASIX) 20 MG tablet Take 20 mg by mouth daily.   Metoprolol Succinate 50 MG CS24 Take 50 mg by mouth daily. Hold if Systolic is < 95 or if pulse is less than 60   Multiple Vitamins-Minerals (CENTRUM SILVER PO) Take 1 tablet by mouth daily.   zinc oxide 20 % ointment Apply 1 application topically as needed for irritation. Apply to buttocks after every incontinent episode and as needed for redness   No facility-administered encounter medications on file as of 04/11/2022.    Review of Systems  Unable to perform ROS: Dementia    Immunization History  Administered Date(s) Administered   Influenza-Unspecified 04/02/2014, 04/01/2016, 04/14/2017, 03/17/2020, 04/07/2021   Moderna SARS-COV2 Booster Vaccination 11/18/2020   Moderna Sars-Covid-2 Vaccination 06/17/2019, 07/15/2019, 04/27/2020, 11/24/2021   PFIZER(Purple Top)SARS-COV-2 Vaccination 03/03/2021   Pneumococcal Conjugate-13 03/24/2014   Pneumococcal Polysaccharide-23 02/26/2009   Tdap 03/08/2012, 12/23/2018   Zoster, Live 09/03/2007   Pertinent  Health Maintenance Due  Topic  Date Due   INFLUENZA VACCINE  01/11/2022   DEXA SCAN  Discontinued      11/14/2019   10:00 PM 11/15/2019    8:00 AM 11/16/2019    8:00 AM 12/11/2020    4:41 PM 12/22/2021    2:26 PM  Fall Risk  Falls in the past year?    1 1  Was there an injury with Fall?    0 0  Fall Risk Category Calculator    2 1  Fall Risk Category    Moderate Low  Patient Fall Risk Level High fall risk High fall risk High  fall risk High fall risk High fall risk  Patient at Risk for Falls Due to    History of fall(s);Impaired balance/gait;Impaired mobility;Mental status change History of fall(s);Impaired balance/gait  Patient at Risk for Falls Due to - Comments     Alzheimers dementia  Fall risk Follow up    Falls evaluation completed;Education provided;Falls prevention discussed Falls evaluation completed;Education provided;Falls prevention discussed   Functional Status Survey:    Vitals:   04/11/22 1232  BP: 137/84  Pulse: 93  Resp: 20  Temp: (!) 97.1 F (36.2 C)  SpO2: 98%  Weight: 165 lb (74.8 kg)  Height: 5\' 1"  (1.549 m)   Body mass index is 31.18 kg/m. Physical Exam Vitals reviewed.  Constitutional:      General: She is not in acute distress. HENT:     Head: Normocephalic.  Eyes:     General:        Right eye: No discharge.        Left eye: No discharge.  Cardiovascular:     Rate and Rhythm: Normal rate. Rhythm irregular.     Pulses: Normal pulses.     Heart sounds: Normal heart sounds.  Pulmonary:     Effort: Pulmonary effort is normal. No respiratory distress.     Breath sounds: Normal breath sounds. No wheezing.  Abdominal:     General: Bowel sounds are normal. There is no distension.     Palpations: Abdomen is soft.     Tenderness: There is no abdominal tenderness.  Musculoskeletal:     Cervical back: Neck supple.     Right lower leg: Edema present.     Left lower leg: Edema present.     Comments: Non pitting  Skin:    General: Skin is warm and dry.     Capillary Refill: Capillary refill takes less than 2 seconds.  Neurological:     General: No focal deficit present.     Mental Status: She is alert. Mental status is at baseline.     Motor: Weakness present.     Gait: Gait abnormal.     Comments: wheelchair  Psychiatric:        Mood and Affect: Mood normal.        Behavior: Behavior normal.     Comments: Aphasia, does not follow commands, alert to self     Labs  reviewed: Recent Labs    09/17/21 0000  NA 137  K 4.3  CL 101  CO2 29*  BUN 27*   Recent Labs    09/17/21 0000  AST 14  ALT 11  ALBUMIN 3.7   Recent Labs    09/17/21 0000  WBC 6.8  NEUTROABS 4,481.00  HGB 10.9*  HCT 34*  PLT 256   Lab Results  Component Value Date   TSH 3.05 09/17/2021   Lab Results  Component Value Date  HGBA1C 5.3 11/13/2019   Lab Results  Component Value Date   CHOL 89 02/27/2020   HDL 46 02/27/2020   LDLCALC 30 02/27/2020   TRIG 51 02/27/2020   CHOLHDL 2.2 11/13/2019    Significant Diagnostic Results in last 30 days:  No results found.  Assessment/Plan 1. Acute cystitis without hematuria - 10/27 urine culture > 10,000-49,000 cfu/mL Escherichia coli - behaviors resolved, will hold off on antibiotics - encourage hydration with water  2. Agitation due to dementia [facility]) - see above - recent sodium normal - no behaviors today per nursing - cont Aricept  3. Late onset Alzheimer's dementia with behavioral disturbance (HCC) - BIMS score now 1/15, was 3/15 12/2021 - cont skilled nursing - cont Aricept    Family/ staff Communication: plan discussed with patient and nurse  Labs/tests ordered:  none

## 2022-04-12 ENCOUNTER — Non-Acute Institutional Stay (SKILLED_NURSING_FACILITY): Payer: Medicare Other | Admitting: Adult Health

## 2022-04-12 ENCOUNTER — Encounter: Payer: Self-pay | Admitting: Adult Health

## 2022-04-12 DIAGNOSIS — I480 Paroxysmal atrial fibrillation: Secondary | ICD-10-CM

## 2022-04-12 DIAGNOSIS — N1832 Chronic kidney disease, stage 3b: Secondary | ICD-10-CM

## 2022-04-12 DIAGNOSIS — G309 Alzheimer's disease, unspecified: Secondary | ICD-10-CM

## 2022-04-12 DIAGNOSIS — R1319 Other dysphagia: Secondary | ICD-10-CM | POA: Diagnosis not present

## 2022-04-12 DIAGNOSIS — R600 Localized edema: Secondary | ICD-10-CM | POA: Diagnosis not present

## 2022-04-12 DIAGNOSIS — F0280 Dementia in other diseases classified elsewhere without behavioral disturbance: Secondary | ICD-10-CM

## 2022-04-12 MED ORDER — CIPROFLOXACIN HCL 500 MG PO TABS: 500.0000 mg | ORAL_TABLET | Freq: Every day | ORAL | 0 refills | Status: DC

## 2022-04-12 NOTE — Progress Notes (Signed)
Location:  [facility] Room Number: 07-A Place of Service:  SNF (31) Provider:Wasyl Dornfeld Medina-Vargas NP  Virgie Dad, MD  Patient Care Team: Virgie Dad, MD as PCP - General (Internal Medicine)  Extended Emergency Contact Information Primary Emergency Contact: [name] Address: [address]          [place], [place] [address] Johnnette Litter of [place] Phone: [phone] Relation: Son  Code Status: DNR  Goals of care: Advanced Directive information    04/12/2022   11:03 AM  Advanced Directives  Does Patient Have a Medical Advance Directive? Yes  Type of Paramedic of [place];Living will;Out of facility DNR (pink MOST or yellow form)  Does patient want to make changes to medical advance directive? No - Patient declined  Copy of [place] in Chart? Yes - validated most recent copy scanned in chart (See row information)  Pre-existing out of facility DNR order (yellow form or pink MOST form) Pink Most/Yellow Form available - Physician notified to receive inpatient order;Yellow form placed in chart (order not valid for inpatient use)     Chief Complaint  Patient presents with   Acute Visit    Poor oral intake    HPI:  Pt is a 88 y.o. female seen today for an acute visit for poor oral intake. She has a PMH of Alzheimer's disease, atrial fibrillation and hypertension. Latest lab showed gfr 34, down from 45 (03/17/22). Staff stated that resident hit staff/agitated during care. She has poor oral intake. She is currently on Nectar thick liquids for dysphagia. Lower extremity no noted edema. She currently take Lasix 20 mg daily for edema.   Wt Readings from Last 3 Encounters:  04/12/22 165 lb (74.8 kg)  04/11/22 165 lb (74.8 kg)  04/08/22 165 lb (74.8 kg)     Past Medical History:  Diagnosis Date   Alzheimer disease ([place])    Atrial fibrillation ([place])    Hypertension    History reviewed. No  pertinent surgical history.  Allergies  Allergen Reactions   Bee Venom Other (See Comments)    Listed on MAR    Allergies as of 04/12/2022       Reactions   Bee Venom Other (See Comments)   Listed on [facility]        Medication List        Accurate as of April 12, 2022  1:31 PM. If you have any questions, ask your nurse or doctor.          apixaban 5 MG Tabs tablet Commonly known as: ELIQUIS Take 5 mg by mouth 2 (two) times daily.   bimatoprost 0.01 % Soln Commonly known as: LUMIGAN Place 1 drop into both eyes at bedtime.   CALCIUM 600 + D PO Take 1 tablet by mouth in the morning and at bedtime.   CENTRUM SILVER PO Take 1 tablet by mouth daily.   ciprofloxacin 500 MG tablet Commonly known as: Cipro Take 1 tablet (500 mg total) by mouth daily for 3 days.   donepezil 5 MG tablet Commonly known as: ARICEPT Take 5 mg by mouth at bedtime.   furosemide 20 MG tablet Commonly known as: LASIX Take 20 mg by mouth daily as needed. For edema   Metoprolol Succinate 50 MG Cs24 Take 50 mg by mouth daily. Hold if Systolic is < 95 or if pulse is less than 60   zinc oxide 20 % ointment Apply 1 application topically  as needed for irritation. Apply to buttocks after every incontinent episode and as needed for redness        Review of Systems  Unable to obtain due to alzheimer's disease.  Immunization History  Administered Date(s) Administered   Influenza-Unspecified 04/02/2014, 04/01/2016, 04/14/2017, 03/17/2020, 04/07/2021   Moderna SARS-COV2 Booster Vaccination 11/18/2020   Moderna Sars-Covid-2 Vaccination 06/17/2019, 07/15/2019, 04/27/2020, 11/24/2021   PFIZER(Purple Top)SARS-COV-2 Vaccination 03/03/2021   Pneumococcal Conjugate-13 03/24/2014   Pneumococcal Polysaccharide-23 02/26/2009   Tdap 03/08/2012, 12/23/2018   Zoster, Live 09/03/2007   Pertinent  Health Maintenance Due  Topic Date Due   INFLUENZA VACCINE  01/11/2022   DEXA SCAN  Discontinued       11/14/2019   10:00 PM 11/15/2019    8:00 AM 11/16/2019    8:00 AM 12/11/2020    4:41 PM 12/22/2021    2:26 PM  Fall Risk  Falls in the past year?    1 1  Was there an injury with Fall?    0 0  Fall Risk Category Calculator    2 1  Fall Risk Category    Moderate Low  Patient Fall Risk Level _0   Patient at Risk for Falls Due to    History of fall(s);Impaired balance/gait;Impaired mobility;Mental status change History of fall(s);Impaired balance/gait  Patient at Risk for Falls Due to - Comments     Alzheimers dementia  Fall risk Follow up    Falls evaluation completed;Education provided;Falls prevention discussed Falls evaluation completed;Education provided;Falls prevention discussed   Functional Status Survey:    Vitals:   04/12/22 1055  BP: (!) 151/84  Pulse: 82  Resp: 20  Temp: (!) 97.1 F (36.2 C)  SpO2: 98%  Weight: 165 lb (74.8 kg)  Height: _1  (1.549 m)   Body mass index is 31.18 kg/m. Physical Exam Constitutional:      General: She is not in acute distress.    Appearance: She is obese.  HENT:     Head: Normocephalic and atraumatic.     Nose: Nose normal.     Mouth/Throat:     Mouth: Mucous membranes are moist.  Eyes:     Conjunctiva/sclera: Conjunctivae normal.  Cardiovascular:     Rate and Rhythm: Normal rate and regular rhythm.  Pulmonary:     Effort: Pulmonary effort is normal.     Breath sounds: Normal breath sounds.  Abdominal:     General: Bowel sounds are normal.     Palpations: Abdomen is soft.  Musculoskeletal:        General: Normal range of motion.     Cervical back: Normal range of motion.  Skin:    General: Skin is warm and dry.  Neurological:     Mental Status: Mental status is at baseline. She is disoriented.  Psychiatric:        Mood and Affect: Mood normal.        Behavior: Behavior normal.     Labs reviewed: Recent Labs    09/17/21 0000  NA 137  K 4.3  CL 101   CO2 29*  BUN 27*   Recent Labs    09/17/21 0000  AST 14  ALT 11  ALBUMIN 3.7   Recent Labs    09/17/21 0000  WBC 6.8  NEUTROABS 4,481.00  HGB 10.9*  HCT 34*  PLT 256   Lab Results  Component Value Date   TSH 3.05 09/17/2021  Lab Results  Component Value Date   HGBA1C 5.3 11/13/2019   Lab Results  Component Value Date   CHOL 89 02/27/2020   HDL 46 02/27/2020   LDLCALC 30 02/27/2020   TRIG 51 02/27/2020   CHOLHDL 2.2 11/13/2019    Significant Diagnostic Results in last 30 days:  No results found.  Assessment/Plan  1. Stage 3b chronic kidney disease ([place]) Lab Results  Component Value Date   NA 137 09/17/2021   K 4.3 09/17/2021   CO2 29 (A) 09/17/2021   GLUCOSE 139 (H) 09/19/2020   BUN 27 (A) 09/17/2021   CREATININE 1.0 03/18/2021   CALCIUM 9.0 03/18/2021   EGFR 50 09/17/2021   GFRNONAA 51 (L) 09/19/2020  ) 04/08/22  GFR 34, down from 45 (03/17/22) -   Na  145 -  will change Lasix to PRN  2. Bilateral lower extremity edema -  no edema noted -  will change Lasix to PRN  3. Paroxysmal atrial fibrillation (HCC) -  rate-controlled, continue Eliquis for anticoagulation and metoprolol succinate for rate-control  4. Other dysphagia -   continue nectar thick liquids -  aspiration precaution  5. Alzheimer disease ([place]) -  has severe cognitive impairment -  continue Aricept  Family/ staff Communication:   Discussed plan of care with charge nurse.  Labs/tests ordered:   None

## 2022-04-12 NOTE — Addendum Note (Signed)
Addended byWindell Moulding E on: 04/12/2022 02:37 PM   Modules accepted: Orders

## 2022-04-17 ENCOUNTER — Telehealth: Payer: Self-pay | Admitting: Nurse Practitioner

## 2022-04-17 NOTE — Telephone Encounter (Signed)
Reported the patient's altered mental status, stared into space, HR about 110bom. Afebrile. Suggested to ED eval if HPOA desires, otherwise continue to monitor VS and neurological symptoms. Obtain CBC/diff, CMP/eGFR, CXR, UA C/S in am.

## 2022-04-18 ENCOUNTER — Non-Acute Institutional Stay (SKILLED_NURSING_FACILITY): Payer: Medicare Other | Admitting: Orthopedic Surgery

## 2022-04-18 ENCOUNTER — Encounter: Payer: Self-pay | Admitting: Orthopedic Surgery

## 2022-04-18 DIAGNOSIS — E870 Hyperosmolality and hypernatremia: Secondary | ICD-10-CM

## 2022-04-18 DIAGNOSIS — F0280 Dementia in other diseases classified elsewhere without behavioral disturbance: Secondary | ICD-10-CM

## 2022-04-18 DIAGNOSIS — R627 Adult failure to thrive: Secondary | ICD-10-CM | POA: Diagnosis not present

## 2022-04-18 DIAGNOSIS — N179 Acute kidney failure, unspecified: Secondary | ICD-10-CM | POA: Diagnosis not present

## 2022-04-18 DIAGNOSIS — G309 Alzheimer's disease, unspecified: Secondary | ICD-10-CM

## 2022-04-18 MED ORDER — MORPHINE SULFATE (CONCENTRATE) 20 MG/ML PO SOLN: 5.0000 mg | ORAL | 0 refills | Status: AC | PRN

## 2022-04-18 NOTE — Progress Notes (Signed)
Location:   [facility] Room Number: 7-A Place of Service:  SNF [phone]) Provider:  Hazle Nordmann, NP  PCP: Mahlon Gammon, MD  Patient Care Team: Mahlon Gammon, MD as PCP - General (Internal Medicine)  Extended Emergency Contact Information Primary Emergency Contact: Marval Regal Address: [address]          [place], [place] [address] Darden Amber of [place] Home Phone: [phone] Relation: Son  Code Status:  DNR Goals of care: Advanced Directive information    04/18/2022    9:51 AM  Advanced Directives  Does Patient Have a Medical Advance Directive? Yes  Type of Estate agent of [place];Living will;Out of facility DNR (pink MOST or yellow form)  Does patient want to make changes to medical advance directive? No - Patient declined  Copy of Healthcare Power of Attorney in Chart? Yes - validated most recent copy scanned in chart (See row information)     Chief Complaint  Patient presents with   Acute Visit    Increased confusion    HPI:  Pt is a 88 y.o. female seen today for an acute visit for increased confusion and elevated HR.   She continues to reside on the skilled nursing unit at [facility]. Past medical history includes: CVA, hypertension, atrial fibrillation, Alzheimer's, CKD stage 3, hyperlipidemia and anemia related to CKD.    11/05 on call provider was notified regarding increased confusion and elevated HR. HR was noted to be 110 bpm. Today's lab work revealed WBC 12.4, BUN/creat 72/2.82, sodium 159, potassium 4.9, Co2 31, chloride 117, CXR pending. 10/27 she was noted to have increased agitation/confusion with staff. She has also been sleeping more. 10/27 lab work revealed :WBC 9.5, BUN/creat 23/1.45, Na 145, K+4.4, C02 27, AST/ALT 22/14. UA revealed trace protein and leukocytes. Urine culture 10,000-49,000 cfu/mL Escherichia coli. Overall, appetite has been declining. She has also been less mobile and resting in  bed more. She is unable to express needs or follow simple commands. She only said " thank you " during our encounter today. Treatment options discussed with HPOA. They would like to proceed with hospice.      Past Medical History:  Diagnosis Date   Alzheimer disease [facility])    Atrial fibrillation (HCC)    Hypertension    History reviewed. No pertinent surgical history.  Allergies  Allergen Reactions   Bee Venom Other (See Comments)    Listed on MAR    Allergies as of 04/18/2022       Reactions   Bee Venom Other (See Comments)   Listed on [facility]        Medication List        Accurate as of April 18, 2022  9:53 AM. If you have any questions, ask your nurse or doctor.          STOP taking these medications    CENTRUM SILVER PO Stopped by: Octavia Heir, NP       TAKE these medications    apixaban 5 MG Tabs tablet Commonly known as: ELIQUIS Take 5 mg by mouth 2 (two) times daily.   bimatoprost 0.01 % Soln Commonly known as: LUMIGAN Place 1 drop into both eyes at bedtime.   CALCIUM 600 + D PO Take 1 tablet by mouth in the morning and at bedtime.   donepezil 5 MG tablet Commonly known as: ARICEPT Take 5 mg by mouth at bedtime.   furosemide 20 MG tablet Commonly  known as: LASIX Take 20 mg by mouth daily as needed. For edema   Metoprolol Succinate 50 MG Cs24 Take 50 mg by mouth daily. Hold if Systolic is < 95 or if pulse is less than 60   zinc oxide 20 % ointment Apply 1 application topically as needed for irritation. Apply to buttocks after every incontinent episode and as needed for redness        Review of Systems  Unable to perform ROS: Dementia    Immunization History  Administered Date(s) Administered   Influenza, High Dose Seasonal PF 04/06/2022   Influenza-Unspecified 04/02/2014, 04/01/2016, 04/14/2017, 03/17/2020, 04/07/2021   Moderna SARS-COV2 Booster Vaccination 11/18/2020   Moderna Sars-Covid-2 Vaccination 06/17/2019, 07/15/2019,  04/27/2020, 11/24/2021   PFIZER(Purple Top)SARS-COV-2 Vaccination 03/03/2021   Pneumococcal Conjugate-13 03/24/2014   Pneumococcal Polysaccharide-23 02/26/2009   Tdap 03/08/2012, 12/23/2018   Zoster, Live 09/03/2007   Pertinent  Health Maintenance Due  Topic Date Due   INFLUENZA VACCINE  Completed   DEXA SCAN  Discontinued      11/14/2019   10:00 PM 11/15/2019    8:00 AM 11/16/2019    8:00 AM 12/11/2020    4:41 PM 12/22/2021    2:26 PM  Fall Risk  Falls in the past year?    1 1  Was there an injury with Fall?    0 0  Fall Risk Category Calculator    2 1  Fall Risk Category    Moderate Low  Patient Fall Risk Level High fall risk High fall risk High fall risk High fall risk High fall risk  Patient at Risk for Falls Due to    History of fall(s);Impaired balance/gait;Impaired mobility;Mental status change History of fall(s);Impaired balance/gait  Patient at Risk for Falls Due to - Comments     Alzheimers dementia  Fall risk Follow up    Falls evaluation completed;Education provided;Falls prevention discussed Falls evaluation completed;Education provided;Falls prevention discussed   Functional Status Survey:    Vitals:   04/18/22 0934  BP: (!) 152/80  Pulse: 97  Resp: 20  Temp: 97.6 F (36.4 C)  SpO2: 96%  Weight: 149 lb (67.6 kg)  Height: 5\' 1"  (1.549 m)   Body mass index is 28.15 kg/m. Physical Exam Constitutional:      General: She is awake. She is not in acute distress.    Appearance: She is not ill-appearing.  HENT:     Head: Normocephalic.  Eyes:     General:        Right eye: No discharge.        Left eye: No discharge.  Cardiovascular:     Rate and Rhythm: Normal rate. Rhythm irregular.     Pulses: Normal pulses.     Heart sounds: Normal heart sounds.  Pulmonary:     Effort: Pulmonary effort is normal. No respiratory distress.     Breath sounds: Normal breath sounds. No wheezing or rales.  Abdominal:     General: Bowel sounds are normal. There is no  distension.     Palpations: Abdomen is soft.     Tenderness: There is no abdominal tenderness.  Musculoskeletal:     Cervical back: Neck supple.     Right lower leg: No edema.     Left lower leg: No edema.  Skin:    General: Skin is warm and dry.     Capillary Refill: Capillary refill takes less than 2 seconds.  Neurological:     General: No focal deficit present.  Mental Status: Mental status is at baseline.     Motor: Weakness present.     Gait: Gait abnormal.     Comments: Bed bound  Psychiatric:        Mood and Affect: Mood normal.        Behavior: Behavior normal.     Comments: Mainly non verbal, does not follow commands, alert to self     Labs reviewed: Recent Labs    09/17/21 0000  NA 137  K 4.3  CL 101  CO2 29*  BUN 27*   Recent Labs    09/17/21 0000  AST 14  ALT 11  ALBUMIN 3.7   Recent Labs    09/17/21 0000  WBC 6.8  NEUTROABS 4,481.00  HGB 10.9*  HCT 34*  PLT 256   Lab Results  Component Value Date   TSH 3.05 09/17/2021   Lab Results  Component Value Date   HGBA1C 5.3 11/13/2019   Lab Results  Component Value Date   CHOL 89 02/27/2020   HDL 46 02/27/2020   LDLCALC 30 02/27/2020   TRIG 51 02/27/2020   CHOLHDL 2.2 11/13/2019    Significant Diagnostic Results in last 30 days:  No results found.  Assessment/Plan 1. Adult failure to thrive - Appetite poor, less ambulatory, sleeping more - WBC 12.4, BUN/creat 72/2.82, sodium 159, potassium 4.9, Co2 31, chloride 117, CXR pending - suspect acute renal failure/ late stage AD  - treatment discussed with HPOA- agree to hospice consult - cancel UA/culture - morphine (ROXANOL) 20 MG/ML concentrated solution; Take 0.25 mLs (5 mg total) by mouth every 4 (four) hours as needed for severe pain or shortness of breath.  Dispense: 30 mL; Refill: 0  2. Hypernatremia - see above - encourage oral hydration with water- discussed with staff  3. Acute renal failure, unspecified acute renal failure  type ([place]) - see above - HR elevated, afebrile at this time  4. Alzheimer disease ([place]) - cont skilled nursing care    Family/ staff Communication: plan discussed with HPOA and nursing  Labs/tests ordered: none

## 2022-04-19 NOTE — Addendum Note (Signed)
Addended byWindell Moulding E on: 04/19/2022 08:41 AM   Modules accepted: Orders

## 2022-05-13 DEATH — deceased
